# Patient Record
Sex: Female | Born: 1960 | Race: White | Hispanic: No | Smoking: Current every day smoker
Health system: Southern US, Community
[De-identification: ages and names within clinical notes are randomized; demographics above are authoritative.]

## PROBLEM LIST (undated history)

## (undated) DIAGNOSIS — F329 Major depressive disorder, single episode, unspecified: Secondary | ICD-10-CM

## (undated) DIAGNOSIS — G709 Myoneural disorder, unspecified: Secondary | ICD-10-CM

## (undated) DIAGNOSIS — F32A Depression, unspecified: Secondary | ICD-10-CM

## (undated) DIAGNOSIS — M1990 Unspecified osteoarthritis, unspecified site: Secondary | ICD-10-CM

## (undated) DIAGNOSIS — M797 Fibromyalgia: Secondary | ICD-10-CM

## (undated) DIAGNOSIS — R51 Headache: Secondary | ICD-10-CM

## (undated) DIAGNOSIS — F419 Anxiety disorder, unspecified: Secondary | ICD-10-CM

## (undated) DIAGNOSIS — L309 Dermatitis, unspecified: Secondary | ICD-10-CM

## (undated) DIAGNOSIS — R569 Unspecified convulsions: Secondary | ICD-10-CM

## (undated) DIAGNOSIS — K219 Gastro-esophageal reflux disease without esophagitis: Secondary | ICD-10-CM

## (undated) DIAGNOSIS — G8929 Other chronic pain: Secondary | ICD-10-CM

## (undated) DIAGNOSIS — Z8719 Personal history of other diseases of the digestive system: Secondary | ICD-10-CM

## (undated) DIAGNOSIS — F319 Bipolar disorder, unspecified: Secondary | ICD-10-CM

## (undated) DIAGNOSIS — R519 Headache, unspecified: Secondary | ICD-10-CM

## (undated) HISTORY — PX: TUBAL LIGATION: SHX77

## (undated) HISTORY — PX: APPENDECTOMY: SHX54

## (undated) HISTORY — PX: ABDOMINAL HYSTERECTOMY: SHX81

---

## 1983-04-27 HISTORY — PX: BRAIN TUMOR EXCISION: SHX577

## 1997-08-10 ENCOUNTER — Emergency Department (HOSPITAL_COMMUNITY): Admission: EM | Admit: 1997-08-10 | Discharge: 1997-08-10 | Payer: Self-pay | Admitting: Cardiology

## 1997-08-24 ENCOUNTER — Emergency Department (HOSPITAL_COMMUNITY): Admission: EM | Admit: 1997-08-24 | Discharge: 1997-08-24 | Payer: Self-pay | Admitting: Emergency Medicine

## 1997-08-29 ENCOUNTER — Emergency Department (HOSPITAL_COMMUNITY): Admission: EM | Admit: 1997-08-29 | Discharge: 1997-08-29 | Payer: Self-pay | Admitting: Emergency Medicine

## 1997-09-15 ENCOUNTER — Emergency Department (HOSPITAL_COMMUNITY): Admission: EM | Admit: 1997-09-15 | Discharge: 1997-09-15 | Payer: Self-pay | Admitting: Emergency Medicine

## 1997-10-03 ENCOUNTER — Emergency Department (HOSPITAL_COMMUNITY): Admission: EM | Admit: 1997-10-03 | Discharge: 1997-10-03 | Payer: Self-pay | Admitting: Internal Medicine

## 2003-12-02 ENCOUNTER — Other Ambulatory Visit: Payer: Self-pay

## 2004-01-25 ENCOUNTER — Ambulatory Visit: Payer: Self-pay | Admitting: Unknown Physician Specialty

## 2004-02-25 ENCOUNTER — Ambulatory Visit: Payer: Self-pay | Admitting: Unknown Physician Specialty

## 2004-03-02 ENCOUNTER — Emergency Department: Payer: Self-pay | Admitting: Emergency Medicine

## 2004-04-07 ENCOUNTER — Emergency Department: Payer: Self-pay | Admitting: Emergency Medicine

## 2004-05-04 ENCOUNTER — Emergency Department: Payer: Self-pay | Admitting: Emergency Medicine

## 2004-06-11 ENCOUNTER — Emergency Department: Payer: Self-pay | Admitting: Emergency Medicine

## 2004-07-02 ENCOUNTER — Emergency Department: Payer: Self-pay | Admitting: Emergency Medicine

## 2004-08-11 ENCOUNTER — Emergency Department: Payer: Self-pay | Admitting: Emergency Medicine

## 2004-09-22 ENCOUNTER — Emergency Department: Payer: Self-pay | Admitting: Internal Medicine

## 2004-10-06 ENCOUNTER — Emergency Department: Payer: Self-pay | Admitting: Emergency Medicine

## 2004-10-08 ENCOUNTER — Emergency Department: Payer: Self-pay | Admitting: Emergency Medicine

## 2004-10-10 ENCOUNTER — Emergency Department: Payer: Self-pay | Admitting: Emergency Medicine

## 2004-10-24 ENCOUNTER — Emergency Department: Payer: Self-pay | Admitting: Emergency Medicine

## 2004-11-05 ENCOUNTER — Emergency Department: Payer: Self-pay | Admitting: Emergency Medicine

## 2004-12-15 ENCOUNTER — Emergency Department: Payer: Self-pay | Admitting: Emergency Medicine

## 2005-01-21 ENCOUNTER — Emergency Department: Payer: Self-pay | Admitting: Unknown Physician Specialty

## 2005-02-12 ENCOUNTER — Emergency Department: Payer: Self-pay | Admitting: Emergency Medicine

## 2005-03-25 ENCOUNTER — Emergency Department: Payer: Self-pay | Admitting: Emergency Medicine

## 2005-04-19 ENCOUNTER — Emergency Department: Payer: Self-pay | Admitting: Emergency Medicine

## 2005-06-01 ENCOUNTER — Emergency Department: Payer: Self-pay | Admitting: Internal Medicine

## 2005-07-01 ENCOUNTER — Emergency Department: Payer: Self-pay | Admitting: Emergency Medicine

## 2005-09-18 ENCOUNTER — Emergency Department: Payer: Self-pay | Admitting: Emergency Medicine

## 2005-10-11 ENCOUNTER — Emergency Department: Payer: Self-pay | Admitting: Emergency Medicine

## 2005-11-10 ENCOUNTER — Emergency Department: Payer: Self-pay | Admitting: Emergency Medicine

## 2005-11-24 ENCOUNTER — Emergency Department: Payer: Self-pay | Admitting: Emergency Medicine

## 2005-11-27 ENCOUNTER — Emergency Department: Payer: Self-pay | Admitting: Unknown Physician Specialty

## 2005-11-30 ENCOUNTER — Inpatient Hospital Stay: Payer: Self-pay | Admitting: Internal Medicine

## 2005-12-05 ENCOUNTER — Other Ambulatory Visit: Payer: Self-pay

## 2006-02-11 ENCOUNTER — Emergency Department: Payer: Self-pay | Admitting: Emergency Medicine

## 2006-02-21 ENCOUNTER — Emergency Department: Payer: Self-pay | Admitting: Emergency Medicine

## 2006-03-27 ENCOUNTER — Emergency Department: Payer: Self-pay | Admitting: Internal Medicine

## 2006-04-30 ENCOUNTER — Emergency Department: Payer: Self-pay | Admitting: Emergency Medicine

## 2006-06-14 ENCOUNTER — Emergency Department: Payer: Self-pay | Admitting: Emergency Medicine

## 2006-06-22 ENCOUNTER — Emergency Department: Payer: Self-pay | Admitting: Emergency Medicine

## 2006-07-05 ENCOUNTER — Emergency Department: Payer: Self-pay | Admitting: Emergency Medicine

## 2006-07-20 ENCOUNTER — Emergency Department: Payer: Self-pay | Admitting: Emergency Medicine

## 2006-07-27 ENCOUNTER — Emergency Department: Payer: Self-pay

## 2006-08-10 ENCOUNTER — Emergency Department: Payer: Self-pay | Admitting: Emergency Medicine

## 2006-08-26 ENCOUNTER — Emergency Department: Payer: Self-pay | Admitting: Unknown Physician Specialty

## 2006-08-27 ENCOUNTER — Emergency Department: Payer: Self-pay | Admitting: Emergency Medicine

## 2006-09-01 ENCOUNTER — Inpatient Hospital Stay: Payer: Self-pay | Admitting: Internal Medicine

## 2006-09-01 ENCOUNTER — Other Ambulatory Visit: Payer: Self-pay

## 2006-09-18 ENCOUNTER — Emergency Department: Payer: Self-pay | Admitting: General Practice

## 2006-09-20 ENCOUNTER — Emergency Department: Payer: Self-pay | Admitting: Emergency Medicine

## 2006-10-03 ENCOUNTER — Emergency Department: Payer: Self-pay | Admitting: Emergency Medicine

## 2006-10-14 ENCOUNTER — Emergency Department: Payer: Self-pay | Admitting: Emergency Medicine

## 2006-12-02 ENCOUNTER — Other Ambulatory Visit: Payer: Self-pay

## 2006-12-02 ENCOUNTER — Inpatient Hospital Stay: Payer: Self-pay

## 2007-02-04 ENCOUNTER — Emergency Department: Payer: Self-pay | Admitting: Emergency Medicine

## 2007-05-23 ENCOUNTER — Emergency Department: Payer: Self-pay | Admitting: Emergency Medicine

## 2007-05-23 ENCOUNTER — Other Ambulatory Visit: Payer: Self-pay

## 2007-06-16 ENCOUNTER — Inpatient Hospital Stay: Payer: Self-pay | Admitting: Internal Medicine

## 2007-06-16 ENCOUNTER — Other Ambulatory Visit: Payer: Self-pay

## 2007-08-11 ENCOUNTER — Emergency Department: Payer: Self-pay | Admitting: Emergency Medicine

## 2007-12-06 ENCOUNTER — Emergency Department: Payer: Self-pay | Admitting: Emergency Medicine

## 2007-12-28 ENCOUNTER — Emergency Department: Payer: Self-pay | Admitting: Emergency Medicine

## 2008-02-13 ENCOUNTER — Emergency Department: Payer: Self-pay | Admitting: Emergency Medicine

## 2008-05-08 ENCOUNTER — Emergency Department: Payer: Self-pay | Admitting: Emergency Medicine

## 2008-06-19 ENCOUNTER — Emergency Department: Payer: Self-pay | Admitting: Emergency Medicine

## 2008-07-15 ENCOUNTER — Emergency Department: Payer: Self-pay | Admitting: Emergency Medicine

## 2008-07-21 ENCOUNTER — Emergency Department: Payer: Self-pay | Admitting: Emergency Medicine

## 2008-07-23 ENCOUNTER — Inpatient Hospital Stay: Payer: Self-pay | Admitting: Internal Medicine

## 2008-07-25 ENCOUNTER — Inpatient Hospital Stay: Payer: Self-pay | Admitting: Psychiatry

## 2008-10-15 ENCOUNTER — Ambulatory Visit (HOSPITAL_COMMUNITY): Admission: RE | Admit: 2008-10-15 | Discharge: 2008-10-15 | Payer: Self-pay | Admitting: Family Medicine

## 2008-10-31 ENCOUNTER — Ambulatory Visit (HOSPITAL_COMMUNITY): Admission: RE | Admit: 2008-10-31 | Discharge: 2008-10-31 | Payer: Self-pay | Admitting: Family Medicine

## 2008-11-14 ENCOUNTER — Emergency Department (HOSPITAL_COMMUNITY): Admission: EM | Admit: 2008-11-14 | Discharge: 2008-11-14 | Payer: Self-pay | Admitting: Emergency Medicine

## 2008-11-25 ENCOUNTER — Inpatient Hospital Stay: Payer: Self-pay | Admitting: Internal Medicine

## 2008-11-28 ENCOUNTER — Inpatient Hospital Stay: Payer: Self-pay | Admitting: Psychiatry

## 2008-12-08 ENCOUNTER — Emergency Department (HOSPITAL_COMMUNITY): Admission: EM | Admit: 2008-12-08 | Discharge: 2008-12-08 | Payer: Self-pay | Admitting: Emergency Medicine

## 2008-12-23 ENCOUNTER — Emergency Department (HOSPITAL_COMMUNITY): Admission: EM | Admit: 2008-12-23 | Discharge: 2008-12-23 | Payer: Self-pay | Admitting: Emergency Medicine

## 2008-12-31 ENCOUNTER — Emergency Department: Payer: Self-pay | Admitting: Emergency Medicine

## 2009-04-29 ENCOUNTER — Emergency Department: Payer: Self-pay | Admitting: Emergency Medicine

## 2009-05-07 ENCOUNTER — Emergency Department: Payer: Self-pay | Admitting: Emergency Medicine

## 2009-05-09 ENCOUNTER — Emergency Department: Payer: Self-pay | Admitting: Emergency Medicine

## 2009-07-11 ENCOUNTER — Inpatient Hospital Stay: Payer: Self-pay | Admitting: Unknown Physician Specialty

## 2009-10-28 ENCOUNTER — Emergency Department: Payer: Self-pay | Admitting: Emergency Medicine

## 2010-02-01 ENCOUNTER — Emergency Department: Payer: Self-pay | Admitting: Emergency Medicine

## 2010-02-10 ENCOUNTER — Emergency Department: Payer: Self-pay | Admitting: Internal Medicine

## 2010-02-13 ENCOUNTER — Emergency Department: Payer: Self-pay | Admitting: Emergency Medicine

## 2010-02-28 ENCOUNTER — Emergency Department: Payer: Self-pay | Admitting: Emergency Medicine

## 2010-04-02 ENCOUNTER — Observation Stay: Payer: Self-pay | Admitting: Internal Medicine

## 2010-04-16 ENCOUNTER — Emergency Department: Payer: Self-pay | Admitting: Emergency Medicine

## 2010-04-30 ENCOUNTER — Emergency Department: Payer: Self-pay | Admitting: Emergency Medicine

## 2010-05-21 ENCOUNTER — Emergency Department: Payer: Self-pay | Admitting: Emergency Medicine

## 2010-07-19 ENCOUNTER — Emergency Department: Payer: Self-pay | Admitting: Internal Medicine

## 2010-08-09 ENCOUNTER — Emergency Department: Payer: Self-pay | Admitting: Emergency Medicine

## 2010-08-10 ENCOUNTER — Emergency Department: Payer: Self-pay | Admitting: Emergency Medicine

## 2010-08-24 ENCOUNTER — Emergency Department: Payer: Self-pay | Admitting: Internal Medicine

## 2010-10-17 ENCOUNTER — Emergency Department: Payer: Self-pay | Admitting: Emergency Medicine

## 2010-10-20 ENCOUNTER — Emergency Department: Payer: Self-pay | Admitting: Emergency Medicine

## 2010-10-27 ENCOUNTER — Emergency Department: Payer: Self-pay | Admitting: Emergency Medicine

## 2010-12-20 ENCOUNTER — Emergency Department: Payer: Self-pay | Admitting: Emergency Medicine

## 2011-01-03 ENCOUNTER — Emergency Department: Payer: Self-pay | Admitting: Emergency Medicine

## 2011-01-31 ENCOUNTER — Emergency Department: Payer: Self-pay | Admitting: Emergency Medicine

## 2011-02-23 ENCOUNTER — Emergency Department: Payer: Self-pay | Admitting: Emergency Medicine

## 2011-04-18 ENCOUNTER — Emergency Department: Payer: Self-pay | Admitting: Emergency Medicine

## 2011-04-25 ENCOUNTER — Emergency Department: Payer: Self-pay | Admitting: Emergency Medicine

## 2011-09-14 ENCOUNTER — Ambulatory Visit: Payer: Self-pay | Admitting: Internal Medicine

## 2011-11-07 ENCOUNTER — Emergency Department: Payer: Self-pay | Admitting: *Deleted

## 2011-11-28 ENCOUNTER — Emergency Department: Payer: Self-pay | Admitting: Emergency Medicine

## 2012-01-15 ENCOUNTER — Emergency Department: Payer: Self-pay | Admitting: Unknown Physician Specialty

## 2012-01-30 ENCOUNTER — Emergency Department: Payer: Self-pay | Admitting: Unknown Physician Specialty

## 2012-03-25 ENCOUNTER — Emergency Department: Payer: Self-pay | Admitting: Emergency Medicine

## 2012-09-30 ENCOUNTER — Emergency Department: Payer: Self-pay | Admitting: Emergency Medicine

## 2012-10-02 ENCOUNTER — Ambulatory Visit: Payer: Self-pay | Admitting: Internal Medicine

## 2012-11-11 ENCOUNTER — Emergency Department: Payer: Self-pay | Admitting: Emergency Medicine

## 2013-03-12 ENCOUNTER — Emergency Department: Payer: Self-pay | Admitting: Emergency Medicine

## 2013-03-13 LAB — COMPREHENSIVE METABOLIC PANEL
Bilirubin,Total: 0.1 mg/dL — ABNORMAL LOW (ref 0.2–1.0)
Chloride: 115 mmol/L — ABNORMAL HIGH (ref 98–107)
Creatinine: 0.91 mg/dL (ref 0.60–1.30)
EGFR (Non-African Amer.): >60
Glucose: 90 mg/dL (ref 65–99)
SGOT(AST): 27 U/L (ref 15–37)
Sodium: 148 mmol/L — ABNORMAL HIGH (ref 136–145)

## 2013-03-13 LAB — DIFFERENTIAL
Basophil %: 1.1 %
Eosinophil #: 0.4 10*3/uL (ref 0.0–0.7)
Eosinophil %: 5.3 %
Lymphocyte #: 3.5 10*3/uL (ref 1.0–3.6)
Lymphocyte %: 48.8 %
Neutrophil #: 2.7 10*3/uL (ref 1.4–6.5)
Neutrophil %: 37.8 %

## 2013-03-13 LAB — URINALYSIS, COMPLETE
Bilirubin,UR: NEGATIVE
Ketone: NEGATIVE
RBC,UR: 1 /HPF (ref 0–5)
Squamous Epithelial: NONE SEEN
WBC UR: 1 /HPF (ref 0–5)

## 2013-03-13 LAB — DRUG SCREEN, URINE
Amphetamines, Ur Screen: NEGATIVE (ref ?–1000)
Benzodiazepine, Ur Scrn: NEGATIVE (ref ?–200)
Opiate, Ur Screen: NEGATIVE (ref ?–300)
Phencyclidine (PCP) Ur S: NEGATIVE (ref ?–25)
Tricyclic, Ur Screen: NEGATIVE (ref ?–1000)

## 2013-03-13 LAB — CBC
HCT: 44.7 % (ref 35.0–47.0)
HGB: 15.1 g/dL (ref 12.0–16.0)
MCH: 35.1 pg — ABNORMAL HIGH (ref 26.0–34.0)
MCV: 104 fL — ABNORMAL HIGH (ref 80–100)
Platelet: 233 10*3/uL (ref 150–440)

## 2013-03-13 LAB — TSH: Thyroid Stimulating Horm: 2.62 u[IU]/mL

## 2013-03-13 LAB — ETHANOL
Ethanol %: 0.182 % — ABNORMAL HIGH (ref 0.000–0.080)
Ethanol: 182 mg/dL

## 2013-03-24 ENCOUNTER — Emergency Department: Payer: Self-pay | Admitting: Internal Medicine

## 2013-03-24 LAB — CBC
HCT: 37.6 % (ref 35.0–47.0)
HGB: 12.7 g/dL (ref 12.0–16.0)
MCHC: 33.7 g/dL (ref 32.0–36.0)
MCV: 105 fL — ABNORMAL HIGH (ref 80–100)
Platelet: 201 10*3/uL (ref 150–440)
WBC: 5.5 10*3/uL (ref 3.6–11.0)

## 2013-03-25 LAB — COMPREHENSIVE METABOLIC PANEL
BUN: 11 mg/dL (ref 7–18)
Calcium, Total: 8.2 mg/dL — ABNORMAL LOW (ref 8.5–10.1)
Chloride: 113 mmol/L — ABNORMAL HIGH (ref 98–107)
Creatinine: 0.88 mg/dL (ref 0.60–1.30)
EGFR (African American): >60
EGFR (Non-African Amer.): >60
Osmolality: 288 (ref 275–301)
Potassium: 2.9 mmol/L — ABNORMAL LOW (ref 3.5–5.1)
SGPT (ALT): 10 U/L — ABNORMAL LOW (ref 12–78)

## 2013-03-25 LAB — TROPONIN I: Troponin-I: <0.02 ng/mL

## 2013-03-25 LAB — DRUG SCREEN, URINE
Amphetamines, Ur Screen: NEGATIVE (ref ?–1000)
Benzodiazepine, Ur Scrn: POSITIVE (ref ?–200)
Cannabinoid 50 Ng, Ur ~~LOC~~: NEGATIVE (ref ?–50)
MDMA (Ecstasy)Ur Screen: NEGATIVE (ref ?–500)
Methadone, Ur Screen: NEGATIVE (ref ?–300)
Opiate, Ur Screen: NEGATIVE (ref ?–300)

## 2013-03-25 LAB — URINALYSIS, COMPLETE
Bacteria: NONE SEEN
Bilirubin,UR: NEGATIVE
Blood: NEGATIVE
Ph: 6 (ref 4.5–8.0)
Protein: NEGATIVE
WBC UR: NONE SEEN /HPF (ref 0–5)

## 2013-03-25 LAB — SALICYLATE LEVEL: Salicylates, Serum: 13.5 mg/dL — ABNORMAL HIGH

## 2013-03-25 LAB — ACETAMINOPHEN LEVEL: Acetaminophen: <2 ug/mL

## 2013-03-25 LAB — ETHANOL: Ethanol: 137 mg/dL

## 2013-04-30 ENCOUNTER — Inpatient Hospital Stay: Payer: Self-pay | Admitting: Internal Medicine

## 2013-04-30 LAB — COMPREHENSIVE METABOLIC PANEL
ANION GAP: 2 — AB (ref 7–16)
Albumin: 3.4 g/dL (ref 3.4–5.0)
Alkaline Phosphatase: 70 U/L
BUN: 17 mg/dL (ref 7–18)
Bilirubin,Total: 0.3 mg/dL (ref 0.2–1.0)
CALCIUM: 8.1 mg/dL — AB (ref 8.5–10.1)
CHLORIDE: 118 mmol/L — AB (ref 98–107)
Co2: 24 mmol/L (ref 21–32)
Creatinine: 1.42 mg/dL — ABNORMAL HIGH (ref 0.60–1.30)
GFR CALC AF AMER: 49 — AB
GFR CALC NON AF AMER: 43 — AB
GLUCOSE: 83 mg/dL (ref 65–99)
Osmolality: 288 (ref 275–301)
POTASSIUM: 3.9 mmol/L (ref 3.5–5.1)
SGOT(AST): 27 U/L (ref 15–37)
SGPT (ALT): 13 U/L (ref 12–78)
Sodium: 144 mmol/L (ref 136–145)
TOTAL PROTEIN: 6.8 g/dL (ref 6.4–8.2)

## 2013-04-30 LAB — DRUG SCREEN, URINE
Amphetamines, Ur Screen: NEGATIVE (ref ?–1000)
Barbiturates, Ur Screen: NEGATIVE (ref ?–200)
Benzodiazepine, Ur Scrn: POSITIVE (ref ?–200)
Cannabinoid 50 Ng, Ur ~~LOC~~: NEGATIVE (ref ?–50)
Cocaine Metabolite,Ur ~~LOC~~: POSITIVE (ref ?–300)
MDMA (ECSTASY) UR SCREEN: POSITIVE (ref ?–500)
Methadone, Ur Screen: NEGATIVE (ref ?–300)
OPIATE, UR SCREEN: NEGATIVE (ref ?–300)
Phencyclidine (PCP) Ur S: NEGATIVE (ref ?–25)
TRICYCLIC, UR SCREEN: NEGATIVE (ref ?–1000)

## 2013-04-30 LAB — ETHANOL

## 2013-04-30 LAB — URINALYSIS, COMPLETE
BILIRUBIN, UR: NEGATIVE
Blood: NEGATIVE
Glucose,UR: NEGATIVE mg/dL (ref 0–75)
KETONE: NEGATIVE
LEUKOCYTE ESTERASE: NEGATIVE
NITRITE: NEGATIVE
PH: 5 (ref 4.5–8.0)
Protein: NEGATIVE
Specific Gravity: 1.03 (ref 1.003–1.030)
WBC UR: 1 /HPF (ref 0–5)

## 2013-04-30 LAB — TSH: Thyroid Stimulating Horm: 0.99 u[IU]/mL

## 2013-04-30 LAB — CBC
HCT: 38.7 % (ref 35.0–47.0)
HGB: 13.1 g/dL (ref 12.0–16.0)
MCH: 35.3 pg — ABNORMAL HIGH (ref 26.0–34.0)
MCHC: 33.9 g/dL (ref 32.0–36.0)
MCV: 104 fL — AB (ref 80–100)
Platelet: 197 10*3/uL (ref 150–440)
RBC: 3.72 10*6/uL — AB (ref 3.80–5.20)
RDW: 12.9 % (ref 11.5–14.5)
WBC: 6.7 10*3/uL (ref 3.6–11.0)

## 2013-04-30 LAB — SALICYLATE LEVEL: Salicylates, Serum: 47.7 mg/dL

## 2013-04-30 LAB — VALPROIC ACID LEVEL: Valproic Acid: <3 ug/mL — ABNORMAL LOW

## 2013-04-30 LAB — ACETAMINOPHEN LEVEL: Acetaminophen: <2 ug/mL

## 2013-05-01 ENCOUNTER — Inpatient Hospital Stay: Payer: Self-pay | Admitting: Psychiatry

## 2013-05-01 LAB — CBC WITH DIFFERENTIAL/PLATELET
Basophil #: 0.1 10*3/uL (ref 0.0–0.1)
Basophil %: 1.1 %
EOS ABS: 0.4 10*3/uL (ref 0.0–0.7)
Eosinophil %: 7.9 %
HCT: 33.6 % — ABNORMAL LOW (ref 35.0–47.0)
HGB: 11.5 g/dL — AB (ref 12.0–16.0)
LYMPHS ABS: 1.9 10*3/uL (ref 1.0–3.6)
Lymphocyte %: 40.7 %
MCH: 35.3 pg — AB (ref 26.0–34.0)
MCHC: 34.1 g/dL (ref 32.0–36.0)
MCV: 104 fL — ABNORMAL HIGH (ref 80–100)
MONOS PCT: 5.9 %
Monocyte #: 0.3 x10 3/mm (ref 0.2–0.9)
NEUTROS PCT: 44.4 %
Neutrophil #: 2 10*3/uL (ref 1.4–6.5)
Platelet: 181 10*3/uL (ref 150–440)
RBC: 3.25 10*6/uL — AB (ref 3.80–5.20)
RDW: 13.2 % (ref 11.5–14.5)
WBC: 4.6 10*3/uL (ref 3.6–11.0)

## 2013-05-01 LAB — COMPREHENSIVE METABOLIC PANEL
ALK PHOS: 58 U/L
Albumin: 2.5 g/dL — ABNORMAL LOW (ref 3.4–5.0)
Anion Gap: 6 — ABNORMAL LOW (ref 7–16)
BILIRUBIN TOTAL: 0.4 mg/dL (ref 0.2–1.0)
BUN: 13 mg/dL (ref 7–18)
CALCIUM: 7.9 mg/dL — AB (ref 8.5–10.1)
CO2: 25 mmol/L (ref 21–32)
CREATININE: 0.97 mg/dL (ref 0.60–1.30)
Chloride: 114 mmol/L — ABNORMAL HIGH (ref 98–107)
EGFR (African American): >60
GLUCOSE: 109 mg/dL — AB (ref 65–99)
Osmolality: 289 (ref 275–301)
POTASSIUM: 3.9 mmol/L (ref 3.5–5.1)
SGOT(AST): 39 U/L — ABNORMAL HIGH (ref 15–37)
SGPT (ALT): 13 U/L (ref 12–78)
SODIUM: 145 mmol/L (ref 136–145)
TOTAL PROTEIN: 6.3 g/dL — AB (ref 6.4–8.2)

## 2013-05-01 LAB — BASIC METABOLIC PANEL
ANION GAP: 5 — AB (ref 7–16)
BUN: 17 mg/dL (ref 7–18)
CALCIUM: 7.6 mg/dL — AB (ref 8.5–10.1)
CO2: 23 mmol/L (ref 21–32)
CREATININE: 1.24 mg/dL (ref 0.60–1.30)
Chloride: 117 mmol/L — ABNORMAL HIGH (ref 98–107)
EGFR (Non-African Amer.): 50 — ABNORMAL LOW
GFR CALC AF AMER: 58 — AB
Glucose: 109 mg/dL — ABNORMAL HIGH (ref 65–99)
OSMOLALITY: 291 (ref 275–301)
Potassium: 3.5 mmol/L (ref 3.5–5.1)
Sodium: 145 mmol/L (ref 136–145)

## 2013-05-01 LAB — SALICYLATE LEVEL
SALICYLATES, SERUM: 33.3 mg/dL — AB
SALICYLATES, SERUM: 30.9 mg/dL — AB
Salicylates, Serum: 38.6 mg/dL

## 2013-05-20 ENCOUNTER — Emergency Department: Payer: Self-pay | Admitting: Emergency Medicine

## 2013-06-27 ENCOUNTER — Inpatient Hospital Stay: Payer: Self-pay | Admitting: Surgery

## 2013-06-27 LAB — BASIC METABOLIC PANEL
Anion Gap: 3 — ABNORMAL LOW (ref 7–16)
BUN: 10 mg/dL (ref 7–18)
CALCIUM: 8.8 mg/dL (ref 8.5–10.1)
CHLORIDE: 111 mmol/L — AB (ref 98–107)
CREATININE: 0.86 mg/dL (ref 0.60–1.30)
Co2: 29 mmol/L (ref 21–32)
EGFR (African American): >60
EGFR (Non-African Amer.): >60
Glucose: 86 mg/dL (ref 65–99)
OSMOLALITY: 283 (ref 275–301)
Potassium: 3.6 mmol/L (ref 3.5–5.1)
Sodium: 143 mmol/L (ref 136–145)

## 2013-06-27 LAB — ETHANOL
ETHANOL %: 0.21 % — AB (ref 0.000–0.080)
ETHANOL LVL: 210 mg/dL

## 2013-06-27 LAB — CBC
HCT: 40.2 % (ref 35.0–47.0)
HGB: 13.9 g/dL (ref 12.0–16.0)
MCH: 36.5 pg — ABNORMAL HIGH (ref 26.0–34.0)
MCHC: 34.5 g/dL (ref 32.0–36.0)
MCV: 106 fL — ABNORMAL HIGH (ref 80–100)
Platelet: 362 10*3/uL (ref 150–440)
RBC: 3.8 10*6/uL (ref 3.80–5.20)
RDW: 13.5 % (ref 11.5–14.5)
WBC: 8.6 10*3/uL (ref 3.6–11.0)

## 2013-07-11 ENCOUNTER — Ambulatory Visit: Payer: Self-pay | Admitting: Surgery

## 2013-07-16 ENCOUNTER — Emergency Department: Payer: Self-pay | Admitting: Emergency Medicine

## 2013-10-19 ENCOUNTER — Emergency Department: Payer: Self-pay | Admitting: Emergency Medicine

## 2013-10-19 LAB — CBC
HCT: 36.6 % (ref 35.0–47.0)
HGB: 11.8 g/dL — ABNORMAL LOW (ref 12.0–16.0)
MCH: 34.1 pg — AB (ref 26.0–34.0)
MCHC: 32.3 g/dL (ref 32.0–36.0)
MCV: 106 fL — AB (ref 80–100)
PLATELETS: 171 10*3/uL (ref 150–440)
RBC: 3.46 10*6/uL — ABNORMAL LOW (ref 3.80–5.20)
RDW: 13.2 % (ref 11.5–14.5)
WBC: 5.6 10*3/uL (ref 3.6–11.0)

## 2013-10-19 LAB — COMPREHENSIVE METABOLIC PANEL
ALBUMIN: 2.9 g/dL — AB (ref 3.4–5.0)
ALT: 9 U/L — AB (ref 12–78)
AST: 18 U/L (ref 15–37)
Alkaline Phosphatase: 72 U/L
Anion Gap: 3 — ABNORMAL LOW (ref 7–16)
BUN: 13 mg/dL (ref 7–18)
Bilirubin,Total: 0.1 mg/dL — ABNORMAL LOW (ref 0.2–1.0)
CALCIUM: 8.1 mg/dL — AB (ref 8.5–10.1)
CREATININE: 1.02 mg/dL (ref 0.60–1.30)
Chloride: 111 mmol/L — ABNORMAL HIGH (ref 98–107)
Co2: 28 mmol/L (ref 21–32)
EGFR (African American): >60
EGFR (Non-African Amer.): >60
Glucose: 86 mg/dL (ref 65–99)
Osmolality: 283 (ref 275–301)
Potassium: 3.7 mmol/L (ref 3.5–5.1)
Sodium: 142 mmol/L (ref 136–145)
Total Protein: 6.1 g/dL — ABNORMAL LOW (ref 6.4–8.2)

## 2013-10-19 LAB — ACETAMINOPHEN LEVEL: Acetaminophen: <2 ug/mL

## 2013-10-19 LAB — SALICYLATE LEVEL: SALICYLATES, SERUM: 15.7 mg/dL — AB

## 2013-10-19 LAB — CK TOTAL AND CKMB (NOT AT ARMC)
CK, Total: 85 U/L
CK-MB: 1.3 ng/mL (ref 0.5–3.6)

## 2013-10-19 LAB — TROPONIN I: Troponin-I: <0.02 ng/mL

## 2013-10-19 LAB — ETHANOL: Ethanol %: <0.003 % (ref 0.000–0.080)

## 2013-10-19 LAB — VALPROIC ACID LEVEL: VALPROIC ACID: 52 ug/mL

## 2013-10-19 LAB — TSH: Thyroid Stimulating Horm: 1.92 u[IU]/mL

## 2013-10-20 LAB — DRUG SCREEN, URINE
AMPHETAMINES, UR SCREEN: NEGATIVE (ref ?–1000)
Barbiturates, Ur Screen: NEGATIVE (ref ?–200)
Benzodiazepine, Ur Scrn: POSITIVE (ref ?–200)
Cannabinoid 50 Ng, Ur ~~LOC~~: NEGATIVE (ref ?–50)
Cocaine Metabolite,Ur ~~LOC~~: POSITIVE (ref ?–300)
MDMA (Ecstasy)Ur Screen: NEGATIVE (ref ?–500)
Methadone, Ur Screen: NEGATIVE (ref ?–300)
Opiate, Ur Screen: NEGATIVE (ref ?–300)
Phencyclidine (PCP) Ur S: NEGATIVE (ref ?–25)
Tricyclic, Ur Screen: NEGATIVE (ref ?–1000)

## 2013-10-20 LAB — URINALYSIS, COMPLETE
BACTERIA: NONE SEEN
BILIRUBIN, UR: NEGATIVE
Blood: NEGATIVE
GLUCOSE, UR: NEGATIVE mg/dL (ref 0–75)
Ketone: NEGATIVE
Nitrite: NEGATIVE
PH: 5 (ref 4.5–8.0)
PROTEIN: NEGATIVE
SPECIFIC GRAVITY: 1.017 (ref 1.003–1.030)
Transitional Epi: 1
WBC UR: 6 /HPF (ref 0–5)

## 2013-10-22 LAB — SALICYLATE LEVEL: Salicylates, Serum: 2.2 mg/dL

## 2013-11-26 ENCOUNTER — Emergency Department: Payer: Self-pay | Admitting: Emergency Medicine

## 2014-01-01 ENCOUNTER — Emergency Department: Payer: Self-pay | Admitting: Student

## 2014-01-01 LAB — CBC WITH DIFFERENTIAL/PLATELET
BASOS PCT: 0.7 %
Basophil #: 0.1 10*3/uL (ref 0.0–0.1)
EOS ABS: 0.1 10*3/uL (ref 0.0–0.7)
Eosinophil %: 1.1 %
HCT: 37.6 % (ref 35.0–47.0)
HGB: 11.7 g/dL — ABNORMAL LOW (ref 12.0–16.0)
LYMPHS ABS: 2.1 10*3/uL (ref 1.0–3.6)
LYMPHS PCT: 18.4 %
MCH: 32.3 pg (ref 26.0–34.0)
MCHC: 31.1 g/dL — AB (ref 32.0–36.0)
MCV: 104 fL — AB (ref 80–100)
MONO ABS: 0.5 x10 3/mm (ref 0.2–0.9)
MONOS PCT: 4.7 %
NEUTROS PCT: 75.1 %
Neutrophil #: 8.3 10*3/uL — ABNORMAL HIGH (ref 1.4–6.5)
Platelet: 257 10*3/uL (ref 150–440)
RBC: 3.62 10*6/uL — ABNORMAL LOW (ref 3.80–5.20)
RDW: 14.1 % (ref 11.5–14.5)
WBC: 11.1 10*3/uL — AB (ref 3.6–11.0)

## 2014-01-01 LAB — COMPREHENSIVE METABOLIC PANEL
ALK PHOS: 100 U/L
ALT: 8 U/L — AB
ANION GAP: 3 — AB (ref 7–16)
Albumin: 3.4 g/dL (ref 3.4–5.0)
BUN: 13 mg/dL (ref 7–18)
Bilirubin,Total: 0.3 mg/dL (ref 0.2–1.0)
Calcium, Total: 8.3 mg/dL — ABNORMAL LOW (ref 8.5–10.1)
Chloride: 110 mmol/L — ABNORMAL HIGH (ref 98–107)
Co2: 25 mmol/L (ref 21–32)
Creatinine: 1.13 mg/dL (ref 0.60–1.30)
EGFR (African American): >60
GFR CALC NON AF AMER: 56 — AB
Glucose: 102 mg/dL — ABNORMAL HIGH (ref 65–99)
Osmolality: 276 (ref 275–301)
POTASSIUM: 3.6 mmol/L (ref 3.5–5.1)
SGOT(AST): 19 U/L (ref 15–37)
SODIUM: 138 mmol/L (ref 136–145)
TOTAL PROTEIN: 7.2 g/dL (ref 6.4–8.2)

## 2014-01-01 LAB — APTT: ACTIVATED PTT: 32.9 s (ref 23.6–35.9)

## 2014-01-01 LAB — PROTIME-INR
INR: 1.1
PROTHROMBIN TIME: 13.9 s (ref 11.5–14.7)

## 2014-03-17 ENCOUNTER — Emergency Department: Payer: Self-pay | Admitting: Internal Medicine

## 2014-08-17 NOTE — H&P (Signed)
PATIENT NAME:  Ariel White, Ariel White MR#:  [id number] DATE OF BIRTH:  06-Apr-1961  DATE OF ADMISSION:  04/30/2013  REFERRING PHYSICIAN: Dr. Margarita Grizzle.   PRIMARY CARE PHYSICIAN: Dr. Arlana Pouch.   CHIEF COMPLAINT: Suicide attempt.   HISTORY OF PRESENT ILLNESS: A 54 year old Caucasian female with past medical history of depression, presenting after an intentional overdose. States that she was fighting with her family; however, her father states she took Xanax 1 mg 8 tablets as well as trazodone 100 mg 2 tablets at approximately 5:30 p.m. EMS was called by the family with concern. In the Emergency Department, she was found to be somnolent, though easily arousable. On basic workup, she was found to have an elevated salicylate level. Case discussed with Poison Control. Recommended starting her on bicarb drip and serial monitoring.   REVIEW OF SYSTEMS:  CONSTITUTIONAL: Denies fever, fatigue, weakness, pain.  EYES: Denies blurred vision, double vision, eye pain.  EARS, NOSE, THROAT: Denies tinnitus, ear pain, hearing loss.  RESPIRATORY: Denies cough, wheeze, shortness of breath.  CARDIOVASCULAR: Denies chest pain, palpitations, edema.  GASTROINTESTINAL: Denies nausea, vomiting, diarrhea, abdominal pain.  GENITOURINARY: Denies dysuria or hematuria.  ENDOCRINE: Denies nocturia or thyroid problems.  HEMATOLOGIC AND LYMPHATIC: Denies easy bruising or bleeding.  SKIN: Denies rash or lesions.  MUSCULOSKELETAL: Denies pain in neck, back, shoulder, knees, hips or arthritic symptoms.  NEUROLOGIC: Denies paralysis, paresthesias.  PSYCHIATRIC: Positive for depression as described above as well as suicide attempt.   Otherwise. review of systems performed by me is negative.   PAST MEDICAL HISTORY: Depression, seizure disorder, history of substance abuse including cocaine and alcohol.   SOCIAL HISTORY: Denies any alcohol usage, though admits to tobacco use. Denies any IV drug usage.   FAMILY HISTORY: Denies any known  cardiovascular or seizure disorders.   ALLERGIES: COMPAZINE, CONTRAST DYE, IMITREX, AS WELL AS ZOFRAN.   HOME MEDICATIONS: Depakote extended release 500 mg daily, multivitamin 1 tab daily, Pristiq 100 mg p.o. daily, tramadol 50 mg p.o. every 4 hours as needed for pain, trazodone 100 mg 2 tablets at bedtime, Xanax 0.5 mg p.o. 3 times daily as needed for anxiety.   PHYSICAL EXAMINATION:  VITAL SIGNS: Temperature 98.2, heart rate 87, respirations 18, blood pressure 96/50, saturating 98% on room air. Weight 54.4 kg, BMI 21.3.  GENERAL: Somewhat disheveled-appearing Caucasian female who is currently in minimal distress secondary to mental status.  HEAD: Normocephalic, atraumatic.  EYES: Pupils equal, round and reactive to light. Extraocular muscles intact. No scleral icterus.  MOUTH: Moist mucosal membranes. Dentition intact. No abscess noted.  EARS, NOSE, THROAT: Throat clear without exudates. No external lesions.  NECK: Supple. No thyromegaly. No nodules. No JVD.  PULMONARY: Clear to auscultation bilaterally. No wheezes, rales, rhonchi. No use of accessory muscles. Good respiratory effort.  CHEST: Nontender to palpation.  CARDIOVASCULAR: S1, S2, regular rate and rhythm. No murmurs, rubs or gallops. No edema. Pedal pulses 2+ bilaterally.  GASTROINTESTINAL: Soft, nontender, nondistended. No masses. Positive bowel sounds. No hepatosplenomegaly.  MUSCULOSKELETAL: No swelling, clubbing or edema. Range of motion full in all extremities.  NEUROLOGIC: Cranial nerves II through XII intact. No gross focal neurological deficits. Sensation intact. Reflexes intact.  SKIN: No ulcerations, lesions, rash or cyanosis. Skin warm, dry. Turgor is intact.  PSYCHIATRIC: The patient is somnolent, though easily arousable to verbal stimuli, conversing but then falling back asleep. Mood and affect blunted. She is oriented x 3. Insight and judgment are poor.   LABORATORY DATA: Sodium 144, potassium 3.9, chloride 118,  bicarb 24, BUN 17, creatinine 1.42, glucose 83. LFTs within normal limits. TSH 0.99. Urine drug screen positive for benzodiazepines, cocaine, as well as MDMA. WBC 6.7, hemoglobin 13.1, platelets 197. Urinalysis negative for evidence of infection. Acetaminophen level less than 2. Salicylate level of 47.7. EKG performed revealing normal sinus rhythm, heart rate 79. No ST or T wave abnormalities.   ASSESSMENT AND PLAN: A 54 year old Caucasian female with history of depression and seizures, presenting after intentional overdose after taking Xanax 8 mg and trazodone 200 mg at 5:30. Also found to be urine drug screen positive for cocaine, Ecstasy and benzodiazepines.  1. Salicylate toxicity: Case discussed with [facility]. She has been started on a bicarb drip. Will follow salicylate levels q.3 hours to follow trend, they recommend observztion until 2 decending levels less than 35 2. Suicide attempt: Place on suicide precautions. Consult psychiatry.  3. Seizure disorder: Continue Depakote.  4. Depression, not otherwise specified: Continue home dose of Pristiq.  6. Venous thromboembolism prophylaxis with heparin subcutaneous.   The patient is FULL CODE.   TIME SPENT: 45 minutes.    ____________________________ Cletis Athensavid K. Hower, MD dkh:gb D: 04/30/2013 23:46:24 ET T: 05/01/2013 00:10:05 ET JOB#: [id number]  cc: Cletis Athensavid K. Hower, MD, <Dictator> DAVID Synetta ShadowK HOWER MD ELECTRONICALLY SIGNED 05/01/2013 0:38

## 2014-08-17 NOTE — Consult Note (Signed)
PATIENT NAME:  Ariel White, [name] MR#:  [id number] DATE OF BIRTH:  1960/05/18  DATE OF CONSULTATION:  10/20/2013  CONSULTING PHYSICIAN:  Surya K. Challa, MD  SEX: Female.  SUBJECTIVE: The patient was seen in consultation in [facility]. 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 The patient is divorced for many years and lives by herself. The patient was brought to the Emergency Room at [facility] with a chief complaint "depressed and drug overdose."  HISTORY OF PRESENT ILLNESS: According to information obtained from the chart, the patient was found face down in her yard in the driveway and was brought here. The patient reports that she took her trazodone and alprazolam and Depakote, which was prescribed to her by her psychiatrist and she took it at bedtime and she went to walk towards the mailbox and she did not know what happened and she came here.  PAST PSYCHIATRIC HISTORY: History of being followed by psychiatrist, Dr. Pollyann GlenHenry Miller and last appointment was a few days ago. Next appointment is 3 months. She gets to be seen by him every 3 months and she takes medications as prescribed.  ALCOHOL AND DRUGS: Denies drinking alcohol, denies street or prescription abuse. Denies smoking nicotine cigarettes.  PAST PSYCHIATRIC HISTORY: Was inpatient hold on psychiatry on 2 occasions, once in [place] and  once at [facility] for [facility]. She tried to overdose her on prescription medications many years ago.  MENTAL STATUS EXAMINATION: The patient is alert and oriented to place and time. Affect is neutral. Mood is stable, denies feeling depressed. Denies feeling hopeless, helpless, denies feeling worthless or useless. Cognition is below average because of status post surgery for brain tumor. However, she knew the date with a little prompting and help. She knew [place] and  capital of [place], name of the current president. Denies feeling depressed. Denies feeling hopeless or helpless. Denies suicidal or homicidal idea or plans. No psychosis, denies auditory or visual, denies hearing voices. . Contracts for safety. Insight and judgment fair. Impulse control is fair. Eager to go home and take care of herself and realizes that she should not stay awake after she has taken her sleep medications that are prescribed by her physician.  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 Currently stable on medications.   RECOMMENDATION: Recommend discharge IVC and discharge patient back home and she will keep up a followup appointment and realizes that she should stay home and go to bed after she takes her sleep medications.    ____________________________ Jannet MantisSurya K. Guss Bundehalla, MD skc:lt D: 10/20/2013 19:11:33 ET T: 10/21/2013 02:09:06 ET JOB#: [id number]  cc: Monika SalkSurya K. Guss Bundehalla, MD, <Dictator> Beau FannySURYA K CHALLA MD ELECTRONICALLY SIGNED 10/21/2013 17:41

## 2014-08-17 NOTE — Discharge Summary (Signed)
PATIENT NAME:  Ariel White, Ariel White MR#:  [id number] DATE OF BIRTH:  04/13/1961  DATE OF ADMISSION:  05/01/2013 DATE OF DISCHARGE:  05/03/2013  HOSPITAL COURSE: See dictated history and physical for details of admission. This [age] year old woman with a history of depression and opiate abuse in the past, came into the hospital having taken an excessive amount of her medication. She required treatment on the medical service briefly to stabilize. The patient is ambivalent about what her intention was with the overdose. She now is tending to say that she does not think she was actually intending to hurt or kill herself with it. She was cooperative with treatment in the hospital. She has talked about having a lot of stress in her life financially in her relationship with her family. She also has chronic headaches, which are an ongoing stress for her. Here in the hospital, she has denied suicidal ideation. Her mood is improved and feels close to baseline. The patient feel like she has better ways to deal with stress at home and has positive thoughts about her life in the future. She has outpatient treatment already set up with [facility] [place]. She has been counseled about the importance of not overdosing on her medication and trying to minimize her use of sedating medication generally and understands this. Here on the psychiatry ward, I did give her a single dose of narcotic pain medicine one time for her severe headaches because of her history that tends to rule out many other treatments, but she understands and has worked it out with her primary care doctor to try and minimize that.   MENTAL STATUS EXAM AT DISCHARGE: Casually dressed, still not very well-groomed woman, looks her stated age or older, cooperative with the interview. Eye contact adequate. Psychomotor activity still a bit sluggish. Speech understandable, normal tone. Affect slightly constricted, not tearful. Does not feel like she is  feeling depressed or sad currently. Thoughts are lucid without obvious loosening of associations or delusions. Denies hallucinations. Denies any suicidal or homicidal ideation. Judgment and insight adequate. Intelligence average. Alert and oriented. The patient is able to express positive thoughts about the future and articulate a positive plan for dealing with stress.   DISCHARGE MEDICATIONS: Depakote 500 mg at night, trazodone 100 mg 2 tablets at night, Pristiq 100 mg once a day, Xanax 1 tablet 3 times a day for anxiety. I have given her only a 10 day supply of this medicine.   LABORATORY RESULTS: Chemistry panel showed most recently still slightly elevated chloride at 114. Glucose 109, calcium low at 7.9, AST slightly elevated at 39; that was a couple of days ago. Her salicylate level had been tending down and it was last checked on the 6th at which time it was down to 33, still slightly into the toxic range. She has not received any more salicylates since then. The patient has been counseled about the potential dangers of salicylate use and strongly encouraged to avoid the use of aspirin, especially in higher than strictly recommended doses.   DISPOSITION: Discharge home. Follow up with Dr. Percell BeltMillet at [facility].   DIAGNOSIS, PRINCIPAL AND PRIMARY:  AXIS I: Depression, not otherwise specified.  SECONDARY DIAGNOSES:  AXIS I: Opiate dependence, in partial remission.  AXIS II: Histrionic and borderline features.  AXIS III: Chronic headaches, migraine type by history.  AXIS IV: Moderate to severe from isolation.  AXIS V: Functioning at time of discharge 55. ____________________________ Audery AmelJohn T. Vayla Wilhelmi, MD jtc:aw D:  05/03/2013 11:06:21 ET T: 05/03/2013 11:22:21 ET JOB#: [id number]  cc: Audery Amel, MD, <Dictator> Audery Amel MD ELECTRONICALLY SIGNED 05/03/2013 23:00

## 2014-08-17 NOTE — Consult Note (Signed)
Brief Consult Note: Diagnosis: Mood disorder NOS, Cocaine abuse.   Patient was seen by consultant.   Consult note dictated.   Recommend further assessment or treatment.   Orders entered.   Discussed with Attending MD.   Comments: [name] has a h/o mood instability and substance abuse. She is stable on medications prescribed by Dr. Percell White, her primary psychiatrist. She was brought here after found in her yard with presumed overdose. The patient adamantly denies SI/HI.  PLAN: 1. She no longer meets criteria for IVC. I will terminate proceedings. Please discharge as appropriate.   2. She is to continue all medications w/o changes. No Rx necessary.  3. She will see Dr. Percell White on 7/21 at 3:00.  Electronic Signatures: Ariel White, Ariel White (MD)  (Signed 29-Jun-15 12:59)  Authored: Brief Consult Note   Last Updated: 29-Jun-15 12:59 by Ariel White, Ariel White (MD)
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
## 2014-08-17 NOTE — Discharge Summary (Signed)
PATIENT NAME:  Ariel White, Ariel White MR#:  [id number] DATE OF BIRTH:  08/05/1960  DATE OF ADMISSION:  04/30/2013 DATE OF DISCHARGE:  05/01/2013   DISPOSITION: Transferred to Behavioral Medicine.   ADMITTING DIAGNOSIS: Suicidal attempt with overdose on Xanax and trazodone.   DISCHARGE DIAGNOSES: 1.  Overdose on Xanax, trazodone, also noticed to have elevated salicylate level, patient treated with bicarbonate. Salicylate level is now normal.  2.  Depression. 3.  Seizure disorder.  4.  History of substance abuse.   PERTINENT LABORATORIES AND EVALUATIONS: Admitting glucose 83, BUN 17, creatinine 1.42, sodium 144, potassium 3.9, chloride 118, CO2 of 24; calcium was 8.1. Alcohol level was less than 3. LFTs were normal. TSH 0.99. Valproic acid less than 3. TUDS were positive for benzo, cocaine, MDMA. WBC 6.7, hemoglobin 13.1; platelet count was 197. Urinalysis was negative. Her salicylate level was 47, then 38.6, and then 30.9.   EKG: Normal sinus rhythm without any ST-T wave changes.   CONSULTANTS: Psychiatry, Dr. Brandy HaleUzma Faheem.   HOSPITAL COURSE: Please refer to H and P done by the admitting physician. The patient is a 54 year old white female who had an altercation with her family members and took overdose on Xanax and trazodone. She came to the ED and was noted to have elevated salicylate levels. Therefore, we were asked to admit the patient. Poison Control was contacted, and they recommended a bicarbonate drip. The patient was continued on bicarbonate drip until Poison Control recommended to stop the bicarbonate levels after salicylate levels normalized. The patient continued to be depressed. She was seen by psychiatry, and they recommended she be admitted to Behavioral Medicine. She is currently stable for Behavioral Medicine admission.  DISCHARGE MEDICATIONS: At the time of Behavioral Medicine transfer: Tylenol 650 q.4 p.r.n. for pain, acetaminophen/oxycodone 325/5 one tab p.o. q.6 p.r.n., magnesium  hydroxide 30 mL once at bedtime, desvenlafaxine 100 mg daily, nicotine inhalation as needed.   DIET: Regular.   ACTIVITY: As tolerated.   FOLLOWUP: With primary MD in 1 to 2 weeks after discharge from Behavioral Medicine.   TIME SPENT: 32 minutes.   ____________________________ Lacie ScottsShreyang H. Allena KatzPatel, MD shp:jcm D: 05/01/2013 15:42:04 ET T: 05/01/2013 16:07:33 ET JOB#: [id number]  cc: Asa Baudoin H. Allena KatzPatel, MD, <Dictator> Charise CarwinSHREYANG H Yetzali Weld MD ELECTRONICALLY SIGNED 05/04/2013 12:43

## 2014-08-17 NOTE — Consult Note (Signed)
PATIENT NAME:  Ariel White, Ariel White MR#:  [id number] DATE OF BIRTH:  [birth year]  DATE OF CONSULTATION:  05/01/2013  REFERRING PHYSICIAN:  Angelica Ran, MD CONSULTING PHYSICIAN:  Ardeen Fillers. Garnetta Buddy, MD  REASON FOR CONSULTATION: Drug overdose.   HISTORY OF PRESENT ILLNESS: The patient is a 54 year old divorced Caucasian female who was admitted after she presented due to an intentional overdose of her prescription medications including Xanax and trazodone. EMS was called by her family members including her brothers and sisters. In the Emergency Room, the patient was found to be somnolent although easily arousable. She was found to have an elevated salicylate level. The case was discussed with poison control and they started her on bicarbonate drip and serial monitoring.   During my interview, the patient was noted to be lying in the bed. She reported that she is feeling better today. She stated that she has a lot of things going on in her life. She stated that she has been taking care of her parents for the past 14 years and her father died last year in 09-12-12. Stated that she has been living in the same house and quit her job at [employer] as she was taking care of her parents. However, her brothers and sisters are now deciding for the house to be sold. She stated that her daddy wanted her to live in the same house as she took care of them for 14 years. She stated that she is under a lot of stress as she does not want to move out of the house. She stated that she decided to kill herself after she had a big argument with her brother and her sisters. She took a handful of her prescription medications including Xanax and trazodone. She knew that it would not be enough so she then tried to cut herself as well. She was feeling very depressed, hopeless, helpless and was crying. She called her son after she overdosed. She was brought to the hospital for observation and treatment. The patient reported that she has been  following with Dr. Fannie Knee  in [place] and he has been prescribing her medications to control her depression. Reported that she has history of personality disorder as well as PTSD. She is currently on the combination of Pristiq, Xanax, trazodone and Depakote which helps with her mood symptoms as well as seizures. The patient is unable to contract for safety at this time.   PAST PSYCHIATRIC HISTORY: The patient reported that she has attempted suicide at least 6 times in the past. She has attempted suicide by overdose and other matters. Her previous diagnoses include personality disorder, PTSD. She has been hospitalized multiple times to different area hospitals. 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 She denies current alcohol use.   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  CURRENT MEDICATIONS: Pristiq, Xanax, trazodone, Depakote and multivitamins.   SOCIAL HISTORY: The patient is currently divorced and lives by herself. She reported that her ex-husband was an alcoholic and he passed away 2 years ago in a car wreck. Her mother died 4 years ago in her father passed away last year. She is currently on disability and has applied to live in an apartment complex in [place]. She has a 73 year old son. The patient reported that she does not have any major issues with her son.   REVIEW OF SYSTEMS: CONSTITUTIONAL: Denies any fever or chills. No  weight changes.  EYES: No double or blurred vision.  ENT: No hearing loss.  RESPIRATORY: No shortness of breath or cough.  CARDIOVASCULAR: Denies any chest pain or orthopnea. GASTROINTESTINAL: No abdominal pain, nausea, vomiting or diarrhea.  GENITOURINARY: No incontinence or frequency.  ENDOCRINE: No heat or cold intolerance.  LYMPHATIC: No anemia or easy bruising.  INTEGUMENTARY: No acne or rash.  MUSCULOSKELETAL: No muscle or joint pain.    VITAL SIGNS: Temperature 98.2, pulse 87, respirations 18, blood pressure 96/50.  LABORATORY DATA: Glucose 109, bicarbonate 13, creatinine 0.97, sodium 145, potassium 3.9, chloride 114, bicarbonate 25, anion gap 6. Blood alcohol less than 3. Protein 6.3, albumin 2.5, alkaline phosphatase 58, AST 39, ALT 13. WBC 4.6, RBC 3.25, hemoglobin 11.5, hematocrit 33.6, platelet count 181, MCV 104, MCH 35.3.   MENTAL STATUS EXAMINATION: The patient is a moderately built female who was lying in the bed. She appeared calm and cooperative during the interview. She maintained good eye contact. Her mood was low and depressed. She was crying during the interview. Her affect was congruent. Thought process was tangential. Thought content was nondelusional. She currently denied having any suicidal or homicidal ideations or plan. She denied having any perceptual disturbances. She just attempted suicide by overdosing on the pills. She demonstrated poor insight and judgment.   DIAGNOSTIC IMPRESSION: AXIS I:  1.  Bipolar disorder, most recent episode mixed, moderate.  2.  History of posttraumatic stress disorder.  AXIS II: Personality disorder with borderline traits.  AXIS III: Please review the medical history.   TREATMENT PLAN: 1.  The patient is under involuntary commitment and will be transferred to the behavioral health unit for stabilization and safety.  2.  She will be continued on her medications including Depakote 500 mg p.o. at bedtime and Pristiq 100 mg p.o. daily. Treatment team to adjust her medications, and she will attend group and milieu therapy.   Thank you for allowing me to participate in the care of this patient.  ____________________________ Ardeen FillersUzma S. Garnetta BuddyFaheem, MD usf:sb D: 05/01/2013 13:04:38 ET T: 05/01/2013 13:23:08 ET JOB#: [id number]  cc: Ardeen FillersUzma S. Garnetta BuddyFaheem, MD, <Dictator> Rhunette CroftUZMA S Bonnita Newby MD ELECTRONICALLY SIGNED 05/10/2013 9:06

## 2014-08-17 NOTE — Discharge Summary (Signed)
PATIENT NAME:  Ariel White, [name] MR#:  [id number] DATE OF BIRTH:  [birth year]  DATE OF ADMISSION:  06/27/2013 DATE OF DISCHARGE:  07/01/2013  BRIEF HISTORY: Chauncey Manndna Gorelick is a 54 year old woman admitted through the Emergency Room after a fall. She complained of some right chest pain. She does have a history of EtOH and drug use in the past, but denied any alcohol use prior to admission. She had a right pneumothorax with at least 1 fractured ribs on the right lateral side. Her blood alcohol was 0.21 at the time of admission. She was admitted to the hospital and observed. No chest tube was placed. Her pneumothorax remained stable over the next several days. However, she had problems with her mental status related to her alcohol abuse and required delirium tremens prophylaxis. She remained stable and was discharged home on the 8th to be followed in the office in 7 to 10 days' time for follow-up chest x-ray.   DISCHARGE MEDICATIONS: Include divalproex sodium 500 mg once a day, trazodone 100 mg 2 tablets at bedtime, Pristiq 50 mg once a day, Valium 2 mg t.i.d., oxycodone 5 mg every 3 to 4 p.r.n., lorazepam 1 mg every 2 to 4 hours p.r.n. anxiety.   FINAL DISCHARGE DIAGNOSES:  1.  Traumatic pneumothorax.  2.  Multiple rib fractures.  3.  History of alcohol abuse.   ____________________________ Carmie Endalph L. Ely III, MD rle:sb D: 07/09/2013 21:22:59 ET T: 07/10/2013 07:34:19 ET JOB#: [id number]  cc: Carmie Endalph L. Ely III, MD, <Dictator> Jillene Bucksenny C. Arlana Pouchate, MD Quentin OreALPH L ELY MD ELECTRONICALLY SIGNED 07/10/2013 19:35

## 2014-11-01 ENCOUNTER — Encounter: Payer: Self-pay | Admitting: Radiology

## 2014-11-01 ENCOUNTER — Emergency Department
Admission: EM | Admit: 2014-11-01 | Discharge: 2014-11-01 | Disposition: A | Discharge status: Home / Self Care | Payer: Medicare Other | Attending: Emergency Medicine | Admitting: Emergency Medicine

## 2014-11-01 ENCOUNTER — Emergency Department: Payer: Medicare Other

## 2014-11-01 DIAGNOSIS — R51 Headache: Secondary | ICD-10-CM | POA: Diagnosis not present

## 2014-11-01 DIAGNOSIS — R112 Nausea with vomiting, unspecified: Secondary | ICD-10-CM | POA: Insufficient documentation

## 2014-11-01 DIAGNOSIS — R519 Headache, unspecified: Secondary | ICD-10-CM

## 2014-11-01 MED ORDER — KETOROLAC TROMETHAMINE 60 MG/2ML IM SOLN
INTRAMUSCULAR | Status: AC
  Administered 2014-11-01: 60 mg via INTRAMUSCULAR
  Filled 2014-11-01: qty 2

## 2014-11-01 MED ORDER — PROMETHAZINE HCL 25 MG/ML IJ SOLN
12.5000 mg | Freq: Once | INTRAMUSCULAR | Status: AC
  Administered 2014-11-01: 12.5 mg via INTRAMUSCULAR

## 2014-11-01 MED ORDER — BUTALBITAL-APAP-CAFFEINE 50-325-40 MG PO TABS: 1.0000 | ORAL_TABLET | Freq: Four times a day (QID) | ORAL | Status: DC | PRN

## 2014-11-01 MED ORDER — KETOROLAC TROMETHAMINE 60 MG/2ML IM SOLN
60.0000 mg | Freq: Once | INTRAMUSCULAR | Status: AC
  Administered 2014-11-01: 60 mg via INTRAMUSCULAR

## 2014-11-01 MED ORDER — PROMETHAZINE HCL 25 MG/ML IJ SOLN
INTRAMUSCULAR | Status: AC
  Administered 2014-11-01: 12.5 mg via INTRAMUSCULAR
  Filled 2014-11-01: qty 1

## 2014-11-01 MED ORDER — IBUPROFEN 800 MG PO TABS: 800.0000 mg | ORAL_TABLET | Freq: Three times a day (TID) | ORAL | Status: DC | PRN

## 2014-11-01 MED ORDER — MEPERIDINE HCL 25 MG/ML IJ SOLN
25.0000 mg | Freq: Once | INTRAMUSCULAR | Status: AC
  Administered 2014-11-01: 25 mg via INTRAMUSCULAR

## 2014-11-01 MED ORDER — MEPERIDINE HCL 25 MG/ML IJ SOLN
INTRAMUSCULAR | Status: AC
  Administered 2014-11-01: 25 mg via INTRAMUSCULAR
  Filled 2014-11-01: qty 1

## 2014-11-01 MED ORDER — PROMETHAZINE HCL 25 MG PO TABS: 25.0000 mg | ORAL_TABLET | Freq: Four times a day (QID) | ORAL | Status: DC | PRN

## 2014-11-01 NOTE — ED Provider Notes (Signed)
[facility] Emergency Department Provider Note  ____________________________________________  Time seen: Approximately 3:39 PM  I have reviewed the triage vital signs and the nursing notes.   HISTORY  Chief Complaint Migraine    HPI Ariel White is a 54 y.o. female who presents to the emergency room for evaluation of sudden onset of migraine headache. States that he has a previous history of migraines secondary to brain surgery but has not had one in years. Patient states that the onset occurred yesterday with purple and pink floaters. She reports having nausea and vomiting since last night. Has taken tramadol and hydrocodone without relief.   No past medical history on file.  There are no active problems to display for this patient.   No past surgical history on file.  Current Outpatient Rx  Name  Route  Sig  Dispense  Refill  . butalbital-acetaminophen-caffeine (FIORICET) 50-325-40 MG per tablet   Oral   Take 1-2 tablets by mouth every 6 (six) hours as needed for headache.   20 tablet   0   . ibuprofen (ADVIL,MOTRIN) 800 MG tablet   Oral   Take 1 tablet (800 mg total) by mouth every 8 (eight) hours as needed.   30 tablet   0   . promethazine (PHENERGAN) 25 MG tablet   Oral   Take 1 tablet (25 mg total) by mouth every 6 (six) hours as needed for nausea or vomiting.   12 tablet   0     Allergies Compazine; Ivp dye; and Imitrex  No family history on file.  Social History History  Substance Use Topics  . Smoking status: Not on file  . Smokeless tobacco: Not on file  . Alcohol Use: Not on file    Review of Systems  Constitutional: No fever/chills Eyes: No visual changes. ENT: No sore throat. Cardiovascular: Denies chest pain. Respiratory: Denies shortness of breath. Gastrointestinal: No abdominal pain.  No nausea, no vomiting.  No diarrhea.  No constipation. Genitourinary: Negative for dysuria. Musculoskeletal: Negative for  back pain. Skin: Negative for rash. Neurological: Negative for headaches, focal weakness or numbness.  10-point ROS otherwise negative.  ____________________________________________   PHYSICAL EXAM:  VITAL SIGNS: ED Triage Vitals  Enc Vitals Group     BP 11/01/14 1258 147/91 mmHg     Pulse Rate 11/01/14 1258 97     Resp 11/01/14 1258 16     Temp 11/01/14 1258 98 F (36.7 C)     Temp Source 11/01/14 1258 Oral     SpO2 11/01/14 1258 100 %     Weight 11/01/14 1258 120 lb (54.432 kg)     Height 11/01/14 1258 5\' 3"  (1.6 m)     Head Cir --      Peak Flow --      Pain Score 11/01/14 1315 10     Pain Loc --      Pain Edu? --      Excl. in GC? --     Constitutional: Alert and oriented. Well appearing and in no acute distress. Eyes: Conjunctivae are normal. PERRL. EOMI. fundoscopyunremarkable Head: Atraumatic. Nose: No congestion/rhinnorhea. Mouth/Throat: Mucous membranes are moist.  Oropharynx non-erythematous. Neck: No stridor.   Cardiovascular: Normal rate, regular rhythm. Grossly normal heart sounds.  Good peripheral circulation. Respiratory: Normal respiratory effort.  No retractions. Lungs CTAB. Gastrointestinal: Soft and nontender. No distention. No abdominal bruits. No CVA tenderness. Musculoskeletal: No lower extremity tenderness nor edema.  No joint effusions. Neurologic:  Normal speech  and language. No gross focal neurologic deficits are appreciated. Speech is normal. No gait instability. Skin:  Skin is warm, dry and intact. No rash noted. Psychiatric: Mood and affect are normal. Speech and behavior are normal.  ____________________________________________   LABS (all labs ordered are listed, but only abnormal results are displayed)  Labs Reviewed - No data to display ____________________________________________  EKG  Not applicable ____________________________________________  RADIOLOGY  CT was unremarkable no new changes from previous. Interpreted by  radiologist. ____________________________________________   PROCEDURES  Procedure(s) performed: None  Critical Care performed: No  ____________________________________________   INITIAL IMPRESSION / ASSESSMENT AND PLAN / ED COURSE  Pertinent labs & imaging results that were available during my care of the patient were reviewed by me and considered in my medical decision making (see chart for details).  Acute migraine headache. Patient was given Demerol 25 mg and Phenergan 12.5 mg IM. She was reexamined at 1635 with no relief in pain. patient at 435.  Will give her 60 mg of Toradol IM. June 22. Patient's headache is decreased from 6-7/10 and feeling much better. Desires to go home.  She voices no other emergency medical complaints at this time and will return with any worsening symptomology. ____________________________________________   FINAL CLINICAL IMPRESSION(S) / ED DIAGNOSES  Final diagnoses:  Headache disorder      Evangeline Dakin, PA-C 11/01/14 1722  Loleta Rose, MD 11/02/14 1500

## 2014-11-01 NOTE — ED Notes (Signed)
Pt states that she has a hx of migraines but states that she hasn't had one in a long time, states that she had brain tumor surgery in '85 and that left her with migraines, pt states that she started with this headache yesterday and states that she has blurred vision, nausea, and has vomited 3 times

## 2014-11-01 NOTE — Discharge Instructions (Signed)
Migraine Headache A migraine headache is an intense, throbbing pain on one or both sides of your head. A migraine can last for 30 minutes to several hours. CAUSES  The exact cause of a migraine headache is not always known. However, a migraine may be caused when nerves in the brain become irritated and release chemicals that cause inflammation. This causes pain. Certain things may also trigger migraines, such as:  Alcohol.  Smoking.  Stress.  Menstruation.  Aged cheeses.  Foods or drinks that contain nitrates, glutamate, aspartame, or tyramine.  Lack of sleep.  Chocolate.  Caffeine.  Hunger.  Physical exertion.  Fatigue.  Medicines used to treat chest pain (nitroglycerine), birth control pills, estrogen, and some blood pressure medicines. SIGNS AND SYMPTOMS  Pain on one or both sides of your head.  Pulsating or throbbing pain.  Severe pain that prevents daily activities.  Pain that is aggravated by any physical activity.  Nausea, vomiting, or both.  Dizziness.  Pain with exposure to bright lights, loud noises, or activity.  General sensitivity to bright lights, loud noises, or smells. Before you get a migraine, you may get warning signs that a migraine is coming (aura). An aura may include:  Seeing flashing lights.  Seeing bright spots, halos, or zigzag lines.  Having tunnel vision or blurred vision.  Having feelings of numbness or tingling.  Having trouble talking.  Having muscle weakness. DIAGNOSIS  A migraine headache is often diagnosed based on:  Symptoms.  Physical exam.  A CT scan or MRI of your head. These imaging tests cannot diagnose migraines, but they can help rule out other causes of headaches. TREATMENT Medicines may be given for pain and nausea. Medicines can also be given to help prevent recurrent migraines.  HOME CARE INSTRUCTIONS  Only take over-the-counter or prescription medicines for pain or discomfort as directed by your  health care provider. The use of long-term narcotics is not recommended.  Lie down in a dark, quiet room when you have a migraine.  Keep a journal to find out what may trigger your migraine headaches. For example, write down:  What you eat and drink.  How much sleep you get.  Any change to your diet or medicines.  Limit alcohol consumption.  Quit smoking if you smoke.  Get 7-9 hours of sleep, or as recommended by your health care provider.  Limit stress.  Keep lights dim if bright lights bother you and make your migraines worse. SEEK IMMEDIATE MEDICAL CARE IF:   Your migraine becomes severe.  You have a fever.  You have a stiff neck.  You have vision loss.  You have muscular weakness or loss of muscle control.  You start losing your balance or have trouble walking.  You feel faint or pass out.  You have severe symptoms that are different from your first symptoms. MAKE SURE YOU:   Understand these instructions.  Will watch your condition.  Will get help right away if you are not doing well or get worse. Document Released: 04/12/2005 Document Revised: 08/27/2013 Document Reviewed: 12/18/2012 [facility] Patient Information 2015 [place]. This information is not intended to replace advice given to you by your health care provider. Make sure you discuss any questions you have with your health care provider.  General Headache Without Cause A headache is pain or discomfort felt around the head or neck area. The specific cause of a headache may not be found. There are many causes and types of headaches. A few common ones  are:  Tension headaches.  Migraine headaches.  Cluster headaches.  Chronic daily headaches. HOME CARE INSTRUCTIONS   Keep all follow-up appointments with your caregiver or any specialist referral.  Only take over-the-counter or prescription medicines for pain or discomfort as directed by your caregiver.  Lie down in a dark, quiet room when  you have a headache.  Keep a headache journal to find out what may trigger your migraine headaches. For example, write down:  What you eat and drink.  How much sleep you get.  Any change to your diet or medicines.  Try massage or other relaxation techniques.  Put ice packs or heat on the head and neck. Use these 3 to 4 times per day for 15 to 20 minutes each time, or as needed.  Limit stress.  Sit up straight, and do not tense your muscles.  Quit smoking if you smoke.  Limit alcohol use.  Decrease the amount of caffeine you drink, or stop drinking caffeine.  Eat and sleep on a regular schedule.  Get 7 to 9 hours of sleep, or as recommended by your caregiver.  Keep lights dim if bright lights bother you and make your headaches worse. SEEK MEDICAL CARE IF:   You have problems with the medicines you were prescribed.  Your medicines are not working.  You have a change from the usual headache.  You have nausea or vomiting. SEEK IMMEDIATE MEDICAL CARE IF:   Your headache becomes severe.  You have a fever.  You have a stiff neck.  You have loss of vision.  You have muscular weakness or loss of muscle control.  You start losing your balance or have trouble walking.  You feel faint or pass out.  You have severe symptoms that are different from your first symptoms. MAKE SURE YOU:   Understand these instructions.  Will watch your condition.  Will get help right away if you are not doing well or get worse. Document Released: 04/12/2005 Document Revised: 07/05/2011 Document Reviewed: 04/28/2011 [facility] Patient Information 2015 [place], [place]. This information is not intended to replace advice given to you by your health care provider. Make sure you discuss any questions you have with your health care provider.

## 2014-11-01 NOTE — ED Notes (Signed)
Returned from CT scan.

## 2014-12-05 ENCOUNTER — Encounter: Payer: Self-pay | Admitting: Emergency Medicine

## 2014-12-05 ENCOUNTER — Emergency Department
Admission: EM | Admit: 2014-12-05 | Discharge: 2014-12-05 | Disposition: A | Discharge status: Home / Self Care | Payer: Medicare Other | Attending: Emergency Medicine | Admitting: Emergency Medicine

## 2014-12-05 DIAGNOSIS — S4991XA Unspecified injury of right shoulder and upper arm, initial encounter: Secondary | ICD-10-CM | POA: Diagnosis present

## 2014-12-05 DIAGNOSIS — X58XXXA Exposure to other specified factors, initial encounter: Secondary | ICD-10-CM | POA: Insufficient documentation

## 2014-12-05 DIAGNOSIS — Y998 Other external cause status: Secondary | ICD-10-CM | POA: Diagnosis not present

## 2014-12-05 DIAGNOSIS — S46911A Strain of unspecified muscle, fascia and tendon at shoulder and upper arm level, right arm, initial encounter: Secondary | ICD-10-CM | POA: Diagnosis not present

## 2014-12-05 DIAGNOSIS — Y93E5 Activity, floor mopping and cleaning: Secondary | ICD-10-CM | POA: Insufficient documentation

## 2014-12-05 DIAGNOSIS — Y92009 Unspecified place in unspecified non-institutional (private) residence as the place of occurrence of the external cause: Secondary | ICD-10-CM | POA: Diagnosis not present

## 2014-12-05 DIAGNOSIS — Z720 Tobacco use: Secondary | ICD-10-CM | POA: Insufficient documentation

## 2014-12-05 MED ORDER — CYCLOBENZAPRINE HCL 10 MG PO TABS
10.0000 mg | ORAL_TABLET | Freq: Three times a day (TID) | ORAL | Status: DC | PRN
  Administered 2014-12-05: 10 mg via ORAL
  Filled 2014-12-05: qty 1

## 2014-12-05 MED ORDER — CYCLOBENZAPRINE HCL 7.5 MG PO TABS: 7.5000 mg | ORAL_TABLET | Freq: Three times a day (TID) | ORAL | Status: DC | PRN

## 2014-12-05 NOTE — Discharge Instructions (Signed)
Shoulder Sprain  A shoulder sprain is the result of damage to the tough, fiber-like tissues (ligaments) that help hold your shoulder in place. The ligaments may be stretched or torn. Besides the main shoulder joint (the ball and socket), there are several smaller joints that connect the bones in this area. A sprain usually involves one of those joints. Most often it is the acromioclavicular (or AC) joint. That is the joint that connects the collarbone (clavicle) and the shoulder blade (scapula) at the top point of the shoulder blade (acromion).  A shoulder sprain is a mild form of what is called a shoulder separation. Recovering from a shoulder sprain may take some time. For some, pain lingers for several months. Most people recover without long term problems.  CAUSES    A shoulder sprain is usually caused by some kind of trauma. This might be:   Falling on an outstretched arm.   Being hit hard on the shoulder.   Twisting the arm.   Shoulder sprains are more likely to occur in people who:   Play sports.   Have balance or coordination problems.  SYMPTOMS    Pain when you move your shoulder.   Limited ability to move the shoulder.   Swelling and tenderness on top of the shoulder.   Redness or warmth in the shoulder.   Bruising.   A change in the shape of the shoulder.  DIAGNOSIS   Your healthcare provider may:   Ask about your symptoms.   Ask about recent activity that might have caused those symptoms.   Examine your shoulder. You may be asked to do simple exercises to test movement. The other shoulder will be examined for comparison.   Order some tests that provide a look inside the body. They can show the extent of the injury. The tests could include:   X-rays.   CT (computed tomography) scan.   MRI (magnetic resonance imaging) scan.  RISKS AND COMPLICATIONS   Loss of full shoulder motion.   Ongoing shoulder pain.  TREATMENT   How long it takes to recover from a shoulder sprain depends on how  severe it was. Treatment options may include:   Rest. You should not use the arm or shoulder until it heals.   Ice. For 2 or 3 days after the injury, put an ice pack on the shoulder up to 4 times a day. It should stay on for 15 to 20 minutes each time. Wrap the ice in a towel so it does not touch your skin.   Over-the-counter medicine to relieve pain.   A sling or brace. This will keep the arm still while the shoulder is healing.   Physical therapy or rehabilitation exercises. These will help you regain strength and motion. Ask your healthcare provider when it is OK to begin these exercises.   Surgery. The need for surgery is rare with a sprained shoulder, but some people may need surgery to keep the joint in place and reduce pain.  HOME CARE INSTRUCTIONS    Ask your healthcare provider about what you should and should not do while your shoulder heals.   Make sure you know how to apply ice to the correct area of your shoulder.   Talk with your healthcare provider about which medications should be used for pain and swelling.   If rehabilitation therapy will be needed, ask your healthcare provider to refer you to a therapist. If it is not recommended, then ask about at-home exercises. Find   Revised: 07/05/2011 Document Reviewed: 08/29/2008 [employer] Patient Information 2015 [place], [place]. This information is not intended to replace advice given to you by your health care provider. Make sure you discuss any questions you have with your health care provider.  Muscle Strain A muscle strain is an injury that occurs when a muscle is stretched beyond its normal length. Usually a small number of muscle fibers are torn when this happens. Muscle strain is rated in  degrees. First-degree strains have the least amount of muscle fiber tearing and pain. Second-degree and third-degree strains have increasingly more tearing and pain.  Usually, recovery from muscle strain takes 1-2 weeks. Complete healing takes 5-6 weeks.  CAUSES  Muscle strain happens when a sudden, violent force placed on a muscle stretches it too far. This may occur with lifting, sports, or a fall.  RISK FACTORS Muscle strain is especially common in athletes.  SIGNS AND SYMPTOMS At the site of the muscle strain, there may be:  Pain.  Bruising.  Swelling.  Difficulty using the muscle due to pain or lack of normal function. DIAGNOSIS  Your health care provider will perform a physical exam and ask about your medical history. TREATMENT  Often, the best treatment for a muscle strain is resting, icing, and applying cold compresses to the injured area.  HOME CARE INSTRUCTIONS   Use the PRICE method of treatment to promote muscle healing during the first 2-3 days after your injury. The PRICE method involves:  Protecting the muscle from being injured again.  Restricting your activity and resting the injured body part.  Icing your injury. To do this, put ice in a plastic bag. Place a towel between your skin and the bag. Then, apply the ice and leave it on from 15-20 minutes each hour. After the third day, switch to moist heat packs.  Apply compression to the injured area with a splint or elastic bandage. Be careful not to wrap it too tightly. This may interfere with blood circulation or increase swelling.  Elevate the injured body part above the level of your heart as often as you can.  Only take over-the-counter or prescription medicines for pain, discomfort, or fever as directed by your health care provider.  Warming up prior to exercise helps to prevent future muscle strains. SEEK MEDICAL CARE IF:   You have increasing pain or swelling in the injured area.  You have numbness,  tingling, or a significant loss of strength in the injured area. MAKE SURE YOU:   Understand these instructions.  Will watch your condition.  Will get help right away if you are not doing well or get worse. Document Released: 04/12/2005 Document Revised: 01/31/2013 Document Reviewed: 11/09/2012 [facility] Patient Information 2015 [place], [place]. This information is not intended to replace advice given to you by your health care provider. Make sure you discuss any questions you have with your health care provider.  Your exam is normal today, showing only strain to the muscles of your arm.  You should take the prescription muscle relaxant along with OTC Tylenol and Motrin for pain relief.  Follow-up with Dr. Arlana Pouch if your symptoms continue beyond a week.

## 2014-12-05 NOTE — ED Notes (Signed)
Patient to ED with c/o left upper arm pain, patient reports injury about a year ago. Reports that she was cleaning around her apartment today and believes she may have overdone it.

## 2014-12-05 NOTE — ED Provider Notes (Signed)
[facility] Emergency Department Provider Note ____________________________________________  Time seen: 2013  I have reviewed the triage vital signs and the nursing notes.  HISTORY  Chief Complaint  Arm Pain  HPI Ariel White is a 54 y.o. female, who is right-handed with c/o of increased pain to her right upper arm after cleaning house. She denies fall, direct trauma, or injury. She thinks she just "over did it"with the vacuum cleaner. She gives a history of right humeral fracture 1 year ago, for which she was discharged from her provider about 6 months ago. She denies catch, click, lock, give-way, or distal paresthesias. She reports pain to the biceps muscle described as throbbing. She reports her pain at 7/10 in triage.  History reviewed. No pertinent past medical history.  There are no active problems to display for this patient.   History reviewed. No pertinent past surgical history.  Current Outpatient Rx  Name  Route  Sig  Dispense  Refill  . butalbital-acetaminophen-caffeine (FIORICET) 50-325-40 MG per tablet   Oral   Take 1-2 tablets by mouth every 6 (six) hours as needed for headache.   20 tablet   0   . cyclobenzaprine (FEXMID) 7.5 MG tablet   Oral   Take 1 tablet (7.5 mg total) by mouth 3 (three) times daily as needed for muscle spasms.   20 tablet   0   . ibuprofen (ADVIL,MOTRIN) 800 MG tablet   Oral   Take 1 tablet (800 mg total) by mouth every 8 (eight) hours as needed.   30 tablet   0   . promethazine (PHENERGAN) 25 MG tablet   Oral   Take 1 tablet (25 mg total) by mouth every 6 (six) hours as needed for nausea or vomiting.   12 tablet   0    Allergies Compazine; Ivp dye; and Imitrex  History reviewed. No pertinent family history.  Social History Social History  Substance Use Topics  . Smoking status: Current Every Day Smoker  . Smokeless tobacco: None  . Alcohol Use: No   Review of Systems  Constitutional: Negative  for fever. Eyes: Negative for visual changes. ENT: Negative for sore throat. Cardiovascular: Negative for chest pain. Respiratory: Negative for shortness of breath. Gastrointestinal: Negative for abdominal pain, vomiting and diarrhea. Genitourinary: Negative for dysuria. Musculoskeletal: Negative for back pain. Right arm pain as above Skin: Negative for rash. Neurological: Negative for headaches, focal weakness or numbness. ____________________________________________  PHYSICAL EXAM:  VITAL SIGNS: ED Triage Vitals  Enc Vitals Group     BP 12/05/14 1817 126/72 mmHg     Pulse Rate 12/05/14 1817 96     Resp 12/05/14 1817 20     Temp 12/05/14 1817 98.6 F (37 C)     Temp Source 12/05/14 1817 Oral     SpO2 12/05/14 1817 96 %     Weight 12/05/14 1817 125 lb (56.7 kg)     Height 12/05/14 1817  (1.6 m)     Head Cir --      Peak Flow --      Pain Score 12/05/14 1818 7     Pain Loc --      Pain Edu? --      Excl. in GC? --    Constitutional: Alert and oriented. Well appearing and in no distress. Eyes: Conjunctivae are normal. PERRL. Normal extraocular movements. ENT   Head: Normocephalic and atraumatic.   Nose: No congestion/rhinnorhea.   Mouth/Throat: Mucous membranes are moist.  Neck: Supple. No thyromegaly. Hematological/Lymphatic/Immunilogical: No cervical lymphadenopathy. Cardiovascular: Normal rate, regular rhythm.  Respiratory: Normal respiratory effort. No wheezes/rales/rhonchi. Gastrointestinal: Soft and nontender. No distention. Musculoskeletal: No deformity, bruise, ecchymosis, or swelling to the RUE. Nontender with normal range of motion in all extremities, including full RUE range without deficit. Normal rotator cuff testing. Normal grip strength. Negative Yergason's. Normal composite fist. Normal grip strength. Neurologic:  CN II-XII grossly intact. Normal UE DTRs bilaterally. Normal gait without ataxia. Normal speech and language. No gross focal  neurologic deficits are appreciated. Skin:  Skin is warm, dry and intact. No rash noted. Psychiatric: Mood and affect are normal. Patient exhibits appropriate insight and judgment. ____________________________________________  PROCEDURES  Flexeril 10 mg PO ____________________________________________  INITIAL IMPRESSION / ASSESSMENT AND PLAN / ED COURSE  Acute strain to the right upper arm. No indication by mechanism of trauma, fracture or dislocation.  Suggest treatment with ibuprofen, Tylenol, and Flexeril. Apply ice to reduce symptoms. Follow-up with Dr. Dewaine Oats if symptoms persists.  ____________________________________________  FINAL CLINICAL IMPRESSION(S) / ED DIAGNOSES  Final diagnoses:  Strain of upper arm, right, initial encounter      Lissa Hoard, PA-C 12/07/14 1430  Loleta Rose, MD 12/08/14 1714

## 2015-01-31 ENCOUNTER — Encounter: Payer: Self-pay | Admitting: Emergency Medicine

## 2015-01-31 ENCOUNTER — Other Ambulatory Visit: Payer: Self-pay

## 2015-01-31 ENCOUNTER — Emergency Department
Admission: EM | Admit: 2015-01-31 | Discharge: 2015-02-01 | Disposition: A | Discharge status: Home / Self Care | Payer: Medicare Other | Attending: Student | Admitting: Student

## 2015-01-31 DIAGNOSIS — F1010 Alcohol abuse, uncomplicated: Secondary | ICD-10-CM

## 2015-01-31 DIAGNOSIS — Y998 Other external cause status: Secondary | ICD-10-CM | POA: Insufficient documentation

## 2015-01-31 DIAGNOSIS — Z720 Tobacco use: Secondary | ICD-10-CM | POA: Diagnosis not present

## 2015-01-31 DIAGNOSIS — F329 Major depressive disorder, single episode, unspecified: Secondary | ICD-10-CM | POA: Insufficient documentation

## 2015-01-31 DIAGNOSIS — F3289 Other specified depressive episodes: Secondary | ICD-10-CM | POA: Diagnosis not present

## 2015-01-31 DIAGNOSIS — F1014 Alcohol abuse with alcohol-induced mood disorder: Secondary | ICD-10-CM | POA: Diagnosis not present

## 2015-01-31 DIAGNOSIS — T391X2A Poisoning by 4-Aminophenol derivatives, intentional self-harm, initial encounter: Secondary | ICD-10-CM | POA: Diagnosis present

## 2015-01-31 DIAGNOSIS — Y9389 Activity, other specified: Secondary | ICD-10-CM | POA: Insufficient documentation

## 2015-01-31 DIAGNOSIS — Y9289 Other specified places as the place of occurrence of the external cause: Secondary | ICD-10-CM | POA: Insufficient documentation

## 2015-01-31 DIAGNOSIS — F32A Depression, unspecified: Secondary | ICD-10-CM

## 2015-01-31 HISTORY — DX: Bipolar disorder, unspecified: F31.9

## 2015-01-31 HISTORY — DX: Major depressive disorder, single episode, unspecified: F32.9

## 2015-01-31 HISTORY — DX: Depression, unspecified: F32.A

## 2015-01-31 HISTORY — DX: Anxiety disorder, unspecified: F41.9

## 2015-01-31 NOTE — ED Notes (Signed)
Pt to rm 16 via EMS from home.  EMS reports family called 911 concerned about pt.  EMS reports pt OD on tylenol PM, unknown amount.  Pt w/ depression related having family in [place] and pt worried.  Pt sleepy upon arrival.

## 2015-01-31 NOTE — ED Notes (Signed)
Sitter at bedside, [place], [place]

## 2015-01-31 NOTE — ED Provider Notes (Signed)
[facility] Emergency Department Provider Note  ____________________________________________  Time seen: Approximately 11:36 PM  I have reviewed the triage vital signs and the nursing notes.   HISTORY  Chief Complaint Drug Overdose    HPI Ariel White is a 54 y.o. female with history of bipolar disorder, substance abuse who presents for evaluation after intentional overdose which occurred suddenly just prior to arrival this evening at approximately 8:30 PM. The patient reports she took "a handful" of Tylenol PM because she was severely worried about her brother in [place] and the threat of hurricane there. She was very worried because he refused to evacuate and she reports "I just wanted to go to sleep so I could stop worrying so much". She takes that her son very cryptic text messages that stated that she was tired of living and that "by the time he gets this message it'll be too late for me". She has had no vomiting, diarrhea, fevers or chills. She has had anxious thoughts recently. Currently her symptoms are moderate. No modifying factors.   Past Medical History  Diagnosis Date  . Depression   . Anxiety   . Bipolar 1 disorder (HCC)     There are no active problems to display for this patient.   Past Surgical History  Procedure Laterality Date  . Brain tumor excision  1985    Current Outpatient Rx  Name  Route  Sig  Dispense  Refill  . butalbital-acetaminophen-caffeine (FIORICET) 50-325-40 MG per tablet   Oral   Take 1-2 tablets by mouth every 6 (six) hours as needed for headache.   20 tablet   0   . cyclobenzaprine (FEXMID) 7.5 MG tablet   Oral   Take 1 tablet (7.5 mg total) by mouth 3 (three) times daily as needed for muscle spasms.   20 tablet   0   . ibuprofen (ADVIL,MOTRIN) 800 MG tablet   Oral   Take 1 tablet (800 mg total) by mouth every 8 (eight) hours as needed.   30 tablet   0   . promethazine (PHENERGAN) 25 MG tablet  Oral   Take 1 tablet (25 mg total) by mouth every 6 (six) hours as needed for nausea or vomiting.   12 tablet   0     Allergies Compazine; Ivp dye; and Imitrex  History reviewed. No pertinent family history.  Social History Social History  Substance Use Topics  . Smoking status: Current Every Day Smoker -- 0.25 packs/day    Types: Cigarettes  . Smokeless tobacco: None  . Alcohol Use: No    Review of Systems Constitutional: No fever/chills Eyes: No visual changes. ENT: No sore throat. Cardiovascular: Denies chest pain. Respiratory: Denies shortness of breath. Gastrointestinal: No abdominal pain.  No nausea, no vomiting.  No diarrhea.  No constipation. Genitourinary: Negative for dysuria. Musculoskeletal: Negative for back pain. Skin: Negative for rash. Neurological: Negative for headaches, focal weakness or numbness.  10-point ROS otherwise negative.  ____________________________________________   PHYSICAL EXAM:  Filed Vitals:   01/31/15 2332 02/01/15 0026 02/01/15 0420  BP: 150/75 143/88 137/90  Pulse: 94 88 70  Temp: 98.6 F (37 C)    TempSrc: Oral    Resp: Height:  (1.6 m)    Weight: 130 lb (58.968 kg)    SpO2: 97% 96% 100%      Constitutional: Alert and oriented. Tearful at times but nontoxic-appearing and in no acute distress.  Eyes: Conjunctivae are normal.  PERRL. EOMI. Head: Atraumatic. Nose: No congestion/rhinnorhea. Mouth/Throat: Mucous membranes are moist.  Oropharynx non-erythematous. Neck: No stridor.   Cardiovascular: Normal rate, regular rhythm. Grossly normal heart sounds.  Good peripheral circulation. Respiratory: Normal respiratory effort.  No retractions. Lungs CTAB. Gastrointestinal: Soft and nontender. No distention. No abdominal bruits. No CVA tenderness. Genitourinary: deferred Musculoskeletal: No lower extremity tenderness nor edema.  No joint effusions. Neurologic:  Normal speech and language. No gross focal  neurologic deficits are appreciated. No gait instability. Skin:  Skin is warm, dry and intact. No rash noted. Psychiatric: Mood is labile and affect is expansive.  ____________________________________________   LABS (all labs ordered are listed, but only abnormal results are displayed)  Labs Reviewed  CBC WITH DIFFERENTIAL/PLATELET - Abnormal; Notable for the following:    MCV 102.2 (*)    MCH 34.6 (*)    Lymphs Abs 3.8 (*)    Eosinophils Absolute 0.8 (*)    All other components within normal limits  COMPREHENSIVE METABOLIC PANEL - Abnormal; Notable for the following:    Total Protein 8.4 (*)    All other components within normal limits  ETHANOL - Abnormal; Notable for the following:    Alcohol, Ethyl (B) 217 (*)    All other components within normal limits  ACETAMINOPHEN LEVEL - Abnormal; Notable for the following:    Acetaminophen (Tylenol), Serum 48 (*)    All other components within normal limits  VALPROIC ACID LEVEL - Abnormal; Notable for the following:    Valproic Acid Lvl <10 (*)    All other components within normal limits  LIPASE, BLOOD  APTT  PROTIME-INR  URINE DRUG SCREEN, QUALITATIVE ([facility] ONLY)  SALICYLATE LEVEL  ACETAMINOPHEN LEVEL   ____________________________________________  EKG  ED ECG REPORT I, Gayla Doss, the attending physician, personally viewed and interpreted this ECG.   Date: 02/01/2015  EKG Time: 23:47  Rate: 82  Rhythm: normal sinus rhythm  Axis: normal  Intervals:none  ST&T Change: No acute ST segment change.  ____________________________________________  RADIOLOGY  none ____________________________________________   PROCEDURES  Procedure(s) performed: None  Critical Care performed: No  ____________________________________________   INITIAL IMPRESSION / ASSESSMENT AND PLAN / ED COURSE  Pertinent labs & imaging results that were available during my care of the patient were reviewed by me and considered in my medical  decision making (see chart for details).  Ariel White is a 54 y.o. female with history of bipolar disorder, substance abuse who presents for evaluation after intentional overdose which occurred suddenly just prior to arrival this evening at approximately 8:30 PM. On exam, she is nontoxic appearing and in no acute distress. Vital signs stable, she is afebrile. She has no acute medical complaints and benign examination. I discussed the ninth events with her son on the phone, Ariel White, who does report that he is also concerned about her and is concerned that she may have been trying to end her life tonight. We discussed the nature of the text messages and I am concerned that this was an intentional overdose possibly with suicidal intent so I will place involuntary commitment, plan for screening labs, consult psychiatry and Behavioral Health. She denies any homicidal ideation or audiovisual hallucinations at this time.  ----------------------------------------- 12:53 AM on 02/01/2015 -----------------------------------------  At this time, the patient has become irate. She is very upset that I place involuntary commitment and I discussed with her that I'm concerned that she might have been trying to kill herself this evening. She is screaming loudly, refuses  to de-escalate despite much prompting, is posturing in a threatening way when staff approaches her.  She received 1 mg of Ativan IM as well as 5 mg of IM Haldol. Labs reviewed. Normal CBC, normal CMP, normal lipase. Alcohol elevated at 217. Undetectable salicylate level. Acetaminophen level is elevated at 48 however patient requires a repeat Tylenol level as this was drawn before the 4 hour mark.Care transferred to Dr. Langston Masker at this time as she will be moved to psych holding for further evaluation/monitoring.  ____________________________________________   FINAL CLINICAL IMPRESSION(S) / ED DIAGNOSES  Final diagnoses:  Intentional acetaminophen  overdose, initial encounter (HCC)      Gayla Doss, MD 02/01/15 [phone]

## 2015-02-01 DIAGNOSIS — F1010 Alcohol abuse, uncomplicated: Secondary | ICD-10-CM

## 2015-02-01 DIAGNOSIS — F329 Major depressive disorder, single episode, unspecified: Secondary | ICD-10-CM

## 2015-02-01 DIAGNOSIS — F1014 Alcohol abuse with alcohol-induced mood disorder: Secondary | ICD-10-CM | POA: Diagnosis not present

## 2015-02-01 DIAGNOSIS — F3289 Other specified depressive episodes: Secondary | ICD-10-CM

## 2015-02-01 DIAGNOSIS — F32A Depression, unspecified: Secondary | ICD-10-CM

## 2015-02-01 LAB — URINE DRUG SCREEN, QUALITATIVE (ARMC ONLY)
Amphetamines, Ur Screen: NOT DETECTED
BARBITURATES, UR SCREEN: NOT DETECTED
Benzodiazepine, Ur Scrn: NOT DETECTED
CANNABINOID 50 NG, UR ~~LOC~~: NOT DETECTED
Cocaine Metabolite,Ur ~~LOC~~: NOT DETECTED
MDMA (Ecstasy)Ur Screen: NOT DETECTED
METHADONE SCREEN, URINE: NOT DETECTED
Opiate, Ur Screen: NOT DETECTED
Phencyclidine (PCP) Ur S: NOT DETECTED
TRICYCLIC, UR SCREEN: NOT DETECTED

## 2015-02-01 LAB — CBC WITH DIFFERENTIAL/PLATELET
BASOS PCT: 1 %
Basophils Absolute: 0.1 10*3/uL (ref 0–0.1)
EOS PCT: 10 %
Eosinophils Absolute: 0.8 10*3/uL — ABNORMAL HIGH (ref 0–0.7)
HCT: 43.8 % (ref 35.0–47.0)
Hemoglobin: 14.8 g/dL (ref 12.0–16.0)
LYMPHS ABS: 3.8 10*3/uL — AB (ref 1.0–3.6)
Lymphocytes Relative: 48 %
MCH: 34.6 pg — AB (ref 26.0–34.0)
MCHC: 33.8 g/dL (ref 32.0–36.0)
MCV: 102.2 fL — ABNORMAL HIGH (ref 80.0–100.0)
Monocytes Absolute: 0.4 10*3/uL (ref 0.2–0.9)
Monocytes Relative: 5 %
NEUTROS PCT: 36 %
Neutro Abs: 2.9 10*3/uL (ref 1.4–6.5)
PLATELETS: 347 10*3/uL (ref 150–440)
RBC: 4.29 MIL/uL (ref 3.80–5.20)
RDW: 13.2 % (ref 11.5–14.5)
WBC: 8 10*3/uL (ref 3.6–11.0)

## 2015-02-01 LAB — COMPREHENSIVE METABOLIC PANEL
ALBUMIN: 4.7 g/dL (ref 3.5–5.0)
ALT: 14 U/L (ref 14–54)
ANION GAP: 10 (ref 5–15)
AST: 30 U/L (ref 15–41)
Alkaline Phosphatase: 100 U/L (ref 38–126)
BUN: 17 mg/dL (ref 6–20)
CHLORIDE: 108 mmol/L (ref 101–111)
CO2: 26 mmol/L (ref 22–32)
Calcium: 9.9 mg/dL (ref 8.9–10.3)
Creatinine, Ser: 0.88 mg/dL (ref 0.44–1.00)
GFR calc non Af Amer: >60 mL/min (ref >60)
GLUCOSE: 96 mg/dL (ref 65–99)
Potassium: 3.8 mmol/L (ref 3.5–5.1)
Sodium: 144 mmol/L (ref 135–145)
Total Bilirubin: 0.4 mg/dL (ref 0.3–1.2)
Total Protein: 8.4 g/dL — ABNORMAL HIGH (ref 6.5–8.1)

## 2015-02-01 LAB — ACETAMINOPHEN LEVEL
ACETAMINOPHEN (TYLENOL), SERUM: 48 ug/mL — AB (ref 10–30)
Acetaminophen (Tylenol), Serum: 19 ug/mL (ref 10–30)

## 2015-02-01 LAB — SALICYLATE LEVEL

## 2015-02-01 LAB — PROTIME-INR
INR: 0.88
PROTHROMBIN TIME: 12.1 s (ref 11.4–15.0)

## 2015-02-01 LAB — APTT: APTT: 30 s (ref 24–36)

## 2015-02-01 LAB — VALPROIC ACID LEVEL: Valproic Acid Lvl: <10 ug/mL — ABNORMAL LOW (ref 50.0–100.0)

## 2015-02-01 LAB — ETHANOL: ALCOHOL ETHYL (B): 217 mg/dL — AB (ref <5)

## 2015-02-01 LAB — LIPASE, BLOOD: Lipase: 37 U/L (ref 22–51)

## 2015-02-01 MED ORDER — HALOPERIDOL LACTATE 5 MG/ML IJ SOLN
INTRAMUSCULAR | Status: AC
  Filled 2015-02-01: qty 1

## 2015-02-01 MED ORDER — HALOPERIDOL LACTATE 5 MG/ML IJ SOLN
5.0000 mg | Freq: Once | INTRAMUSCULAR | Status: AC
  Administered 2015-02-01: 01:00:00 via INTRAMUSCULAR

## 2015-02-01 MED ORDER — LORAZEPAM 2 MG/ML IJ SOLN
INTRAMUSCULAR | Status: AC
Start: 2015-02-01 — End: 2015-02-01
  Filled 2015-02-01: qty 1

## 2015-02-01 MED ORDER — LORAZEPAM 2 MG/ML IJ SOLN
1.0000 mg | Freq: Once | INTRAMUSCULAR | Status: AC
  Administered 2015-02-01: 01:00:00 via INTRAMUSCULAR

## 2015-02-01 NOTE — ED Notes (Signed)
Report received from shannon, rn.

## 2015-02-01 NOTE — ED Notes (Signed)

## 2015-02-01 NOTE — ED Notes (Signed)
Pt sleeping, resps unlabored.  

## 2015-02-01 NOTE — ED Notes (Signed)
Pt increasing in aggression, verbally.  Pt pacing in room, throwing equipment on floor.  Multiple nurses and MD at bedside attempting to redirect pt.  Verbal order for medications received.

## 2015-02-01 NOTE — ED Notes (Signed)
BEHAVIORAL HEALTH ROUNDING Patient sleeping: No. Patient alert and oriented: yes Behavior appropriate: No.; If no, describe: pt cussing at staff and reporting she will sue hospital Nutrition and fluids offered: Yes  Toileting and hygiene offered: Yes  Sitter present: no Law enforcement present: Yes

## 2015-02-01 NOTE — BH Assessment (Signed)
Assessment Note  Ariel White is an 54 y.o. female presenting to the ED voluntarily after family members called EMS due to possible OD on Tylenol PM.  Pt reports she has been having difficulty sleeping and decide to take 4-5 Tylenol PM to help her sleep.  She states she has been feeling concerned about her brother, who lives in [place], and his decision to ride out the hurricane in [place].  Pt reports feeling overwhelmed with taking care of "everyone's" problems and needs.    Pt states she used to be on medications for Bipolar and decided to wean herself off.  Pt states she did not like the side effects and feels she has done well with not being on medications.  Pt denies SI/HI and any alcohol/drug use.  Diagnosis: Overdose  Past Medical History:  Past Medical History  Diagnosis Date  . Depression   . Anxiety   . Bipolar 1 disorder [facility])     Past Surgical History  Procedure Laterality Date  . Brain tumor excision  1985    Family History: History reviewed. No pertinent family history.  Social History:  reports that she has been smoking Cigarettes.  She has been smoking about 0.25 packs per day. She does not have any smokeless tobacco history on file. She reports that she does not drink alcohol. Her drug history is not on file.  Additional Social History:  Alcohol / Drug Use History of alcohol / drug use?: No history of alcohol / drug abuse (Pt denies alcohol use.)  CIWA: CIWA-Ar BP: (!) 143/88 mmHg Pulse Rate: 88 COWS:    Allergies:  Allergies  Allergen Reactions  . Compazine [Prochlorperazine Edisylate] Other (See Comments)    euphoria  . Ivp Dye [Iodinated Diagnostic Agents] Anaphylaxis    "stopped her heart"  . Imitrex [Sumatriptan] Swelling    "throat swelling"    Home Medications:  (Not in a hospital admission)  OB/GYN Status:  No LMP recorded. Patient has had a hysterectomy.  General Assessment Data Location of Assessment: [facility] ED TTS Assessment: In  system Is this a Tele or Face-to-Face Assessment?: Face-to-Face Is this an Initial Assessment or a Re-assessment for this encounter?: Initial Assessment Marital status: Single Maiden name: [name] Is patient pregnant?: No Pregnancy Status: No Living Arrangements: Alone Can pt return to current living arrangement?: Yes Admission Status: Voluntary Is patient capable of signing voluntary admission?: Yes Referral Source: Self/Family/Friend Insurance type: Medicare  Medical Screening Exam [facility] Walk-in ONLY) Medical Exam completed: Yes  Crisis Care Plan Living Arrangements: Alone Name of Psychiatrist: None reported Name of Therapist: None reported  Education Status Is patient currently in school?: No Current Grade: N/A Highest grade of school patient has completed: N/A Name of school: N/A Contact person: N/A  Risk to self with the past 6 months Suicidal Ideation: No Has patient been a risk to self within the past 6 months prior to admission? : No Suicidal Intent: No Has patient had any suicidal intent within the past 6 months prior to admission? : No Is patient at risk for suicide?: No Suicidal Plan?: No Has patient had any suicidal plan within the past 6 months prior to admission? : No Access to Means: Yes Specify Access to Suicidal Means: Pt has access to pills. What has been your use of drugs/alcohol within the last 12 months?: None reported Previous Attempts/Gestures: No How many times?: 0 Other Self Harm Risks: None Triggers for Past Attempts: None known Intentional Self Injurious Behavior: None Family Suicide  History: No Recent stressful life event(s): Other (Comment) (Pt reports concern about family members living in [place].) Persecutory voices/beliefs?: No Depression: No Substance abuse history and/or treatment for substance abuse?: No Suicide prevention information given to non-admitted patients: Not applicable  Risk to Others within the past 6 months Homicidal  Ideation: No Does patient have any lifetime risk of violence toward others beyond the six months prior to admission? : No Thoughts of Harm to Others: No Current Homicidal Intent: No Current Homicidal Plan: No Access to Homicidal Means: No Identified Victim: N/A History of harm to others?: No Assessment of Violence: None Noted Violent Behavior Description: N/A Does patient have access to weapons?: No Criminal Charges Pending?: No Does patient have a court date: No Is patient on probation?: No  Psychosis Hallucinations: None noted Delusions: None noted  Mental Status Report Appearance/Hygiene: In hospital gown Eye Contact: Good Motor Activity: Unremarkable Speech: Logical/coherent Level of Consciousness: Drowsy Mood: Anxious Affect: Anxious Anxiety Level: Minimal Thought Processes: Coherent Judgement: Partial Orientation: Person, Place, Situation, Time Obsessive Compulsive Thoughts/Behaviors: None  Cognitive Functioning Concentration: Good Memory: Recent Intact IQ: Average Insight: Fair Impulse Control: Fair Appetite: Good Weight Loss: 0 Weight Gain: 0 Sleep: Decreased Total Hours of Sleep: 4 Vegetative Symptoms: None  ADLScreening [facility] Assessment Services) Patient's cognitive ability adequate to safely complete daily activities?: Yes Patient able to express need for assistance with ADLs?: Yes Independently performs ADLs?: Yes (appropriate for developmental age)  Prior Inpatient Therapy Prior Inpatient Therapy: No Prior Therapy Dates: N/A Prior Therapy Facilty/Provider(s): N/A Reason for Treatment: N/A  Prior Outpatient Therapy Prior Outpatient Therapy: No Prior Therapy Dates: N/A Prior Therapy Facilty/Provider(s): N/A Reason for Treatment: N/A Does patient have an ACCT team?: No Does patient have Intensive In-House Services?  : No Does patient have [facility] services? : No Does patient have P4CC services?: No  ADL Screening (condition at time of  admission) Patient's cognitive ability adequate to safely complete daily activities?: Yes Patient able to express need for assistance with ADLs?: Yes Independently performs ADLs?: Yes (appropriate for developmental age)       Abuse/Neglect Assessment (Assessment to be complete while patient is alone) Physical Abuse: Denies Verbal Abuse: Denies Sexual Abuse: Denies Exploitation of patient/patient's resources: Denies Self-Neglect: Denies Values / Beliefs Cultural Requests During Hospitalization: None Spiritual Requests During Hospitalization: None Consults Spiritual Care Consult Needed: No Social Work Consult Needed: No Merchant navy officer (For Healthcare) Does patient have an advance directive?: No Would patient like information on creating an advanced directive?: No - patient declined information    Additional Information 1:1 In Past 12 Months?: No CIRT Risk: No Elopement Risk: No     Disposition:  Disposition Initial Assessment Completed for this Encounter: Yes Disposition of Patient: Other dispositions Other disposition(s): Other (Comment) (Psych MD consult)  On Site Evaluation by:   Reviewed with Physician:    Manus Rudd Asir Bingley 02/01/2015 12:50 AM

## 2015-02-01 NOTE — ED Notes (Signed)
BEHAVIORAL HEALTH ROUNDING Patient sleeping: No. Patient alert and oriented: yes Behavior appropriate: Yes.  ; If no, describe:  Nutrition and fluids offered: Yes  Toileting and hygiene offered: Yes  Sitter present: not applicable Law enforcement present: Yes  

## 2015-02-01 NOTE — ED Notes (Addendum)
ED BHU PLACEMENT JUSTIFICATION Is the patient under IVC or is there intent for IVC: Yes.   Is the patient medically cleared: No. Is there vacancy in the ED BHU: Yes.   Is the population mix appropriate for patient: No. Is the patient awaiting placement in inpatient or outpatient setting: No Has the patient had a psychiatric consult: No. Survey of unit performed for contraband, proper placement and condition of furniture, tampering with fixtures in bathroom, shower, and each patient room: Yes.  ; Findings:  APPEARANCE/BEHAVIOR calm and cooperative NEURO ASSESSMENT Orientation: time, place and person Hallucinations: No.None noted (Hallucinations) Speech: Normal Gait: normal RESPIRATORY ASSESSMENT Normal expansion.  Clear to auscultation.  No rales, rhonchi, or wheezing. CARDIOVASCULAR ASSESSMENT regular rate and rhythm, S1, S2 normal, no murmur, click, rub or gallop GASTROINTESTINAL ASSESSMENT soft, nontender, BS WNL, no r/g EXTREMITIES normal strength, tone, and muscle mass, no deformities, no erythema, induration, or nodules, ROM of all joints is normal PLAN OF CARE Provide calm/safe environment. Vital signs assessed twice daily. ED BHU Assessment once each 12-hour shift. Collaborate with intake RN daily or as condition indicates. Assure the ED provider has rounded once each shift. Provide and encourage hygiene. Provide redirection as needed. Assess for escalating behavior; address immediately and inform ED provider.  Assess family dynamic and appropriateness for visitation as needed: Yes.  ; If necessary, describe findings:  Educate the patient/family about BHU procedures/visitation: Yes.  ; If necessary, describe findings:   ENVIRONMENTAL ASSESSMENT Potentially harmful objects out of patient reach: Yes.   Personal belongings secured: Yes.   Patient dressed in hospital provided attire only: Yes.   Plastic bags out of patient reach: Yes.   Patient care equipment (cords, cables, call  bells, lines, and drains) shortened, removed, or accounted for: Yes.   Equipment and supplies removed from bottom of stretcher: Yes.   Potentially toxic materials out of patient reach: Yes.   Sharps container removed or out of patient reach: Yes.     Pt in in rm 16 on monitor due to post-od.  Pt and equipment visible from nurse's station with door open.  BEHAVIORAL HEALTH ROUNDING Patient sleeping: No. Patient alert and oriented: yes Behavior appropriate: Yes.  ; If no, describe:  Nutrition and fluids offered: No Toileting and hygiene offered: Yes  Sitter present: yes Law enforcement present: No

## 2015-02-01 NOTE — ED Provider Notes (Signed)
-----------------------------------------   7:10 AM on 02/01/2015 -----------------------------------------   Blood pressure 137/90, pulse 70, temperature 98.6 F (37 C), temperature source Oral, resp. rate 18, height  (1.6 m), weight 130 lb (58.968 kg), SpO2 100 %.  The patient had no acute events since last update.  Calm and cooperative at this time.  Disposition is pending per Psychiatry/Behavioral Medicine team recommendations.     Arnaldo Natal, MD 02/01/15 [phone]

## 2015-02-01 NOTE — ED Notes (Signed)
BEHAVIORAL HEALTH ROUNDING Patient sleeping: Yes.   Patient alert and oriented: pt sleeping Behavior appropriate: Yes.  ; If no, describe:   Nutrition and fluids offered: pt sleeping Toileting and hygiene offered: pt sleeping Sitter present: not applicable Law enforcement present: Yes  
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
## 2015-02-01 NOTE — ED Notes (Signed)
Pt telling at staff and is attempting close door and blinds. Staff informed pt this is not allowed. Pt continued to yell and cuss at staff.

## 2015-02-01 NOTE — ED Notes (Signed)
Report to kim, rn.  

## 2015-02-01 NOTE — ED Notes (Signed)
Pt spoke to sister via telephone in regards to d/c and pick up. Sister states will be here approx, 15 mins. Pt will be d/c once sister arrives. Pt given belongings bag back by ED tech at this time, pt changing. Pt calm and cooperative, no acute distress noted.

## 2015-02-01 NOTE — ED Notes (Signed)
BEHAVIORAL HEALTH ROUNDING Patient sleeping: Yes.   Patient alert and oriented: yes Behavior appropriate: Yes.  ;  Nutrition and fluids offered: Yes  Toileting and hygiene offered: Yes  Sitter present: no Law enforcement present: Yes  

## 2015-02-01 NOTE — ED Notes (Signed)
After medication administration, pt assisted to different stretcher and taken to quad for further monitoring and evaluation.

## 2015-02-01 NOTE — Discharge Instructions (Signed)
Acetaminophen Overdose When acetaminophen is used as directed, it is a very safe and effective medicine that can help relieve pain or fever. However, when taken in large doses, it can lead to an acetaminophen overdose. An overdose of this medicine can lead to serious problems, such as liver damage and death. The maximum recommended dosage of acetaminophen when taken out of the hospital is 3,000 mg a day in adults, or 5 doses a day in children. Acetaminophen is available in tablets that you can swallow, chew, or dissolve on your tongue. It is also available as a liquid or suppository. All types can be purchased over the counter. Acetaminophen is found in many other over-the-counter medicines, such as medicines for cough, cold, and flu. Acetaminophen is also contained in many prescription-strength pain medicines. Check all medicine labels for the presence and dosage of acetaminophen. Medicine labels may use abbreviations or other names for acetaminophen, such as APAP, AC, or Acetamin. One common brand name of acetaminophen is Tylenol.  CAUSES An acetaminophen overdose is caused by taking a larger-than-recommended dose of acetaminophen. SYMPTOMS Symptoms may not appear for hours or even days following the actual overdose. Symptoms may include:  Fatigue.  Abdominal pain.  Nausea or vomiting.  Confusion.  Loss of appetite.  Unexplained sweating.  Enlarged liver.  Yellowing of the skin (jaundice).  Convulsions.  Coma. DIAGNOSIS Your health care provider will perform a physical exam and ask about your medical history. Testing may be required and may include:  Blood tests to determine how much acetaminophen is in your blood.  Blood tests to look for liver problems.  Close monitoring of your blood pressure, pulse, temperature, and breathing.  Ultrasound or CT scan of your abdomen. TREATMENT Your treatment will depend on your test results and may include:  Emptying your  stomach.  Giving you activated charcoal by mouth.  Using medicines to treat your symptoms or reverse the effect of the acetaminophen. HOME CARE INSTRUCTIONS After you have been examined and treated by your health care provider:  Take medicines only as directed by your health care provider. Avoid any medicines that contain acetaminophen.  Follow up with your health care provider as directed. This is very important. You may need follow-up blood tests to check your liver.  Do not drink alcohol.  Drink enough fluid to keep your urine clear or pale yellow. SEEK IMMEDIATE MEDICAL CARE IF:  There has been an intentional or accidental ingestion of acetaminophen. A severe overdose may represent a serious problem that is an emergency. Do not wait to see if the symptoms will go away. Get medical help right away. Call your local emergency services (911 in the [place] Do not drive yourself to the hospital. If an overdose is suspected, you can also call the [facility] at [phone].   This information is not intended to replace advice given to you by your health care provider. Make sure you discuss any questions you have with your health care provider.   Document Released: 01/25/2014 Document Reviewed: 01/25/2014 Elsevier Interactive Patient Education [employer]   Please make follow up plans, as discussed with psychiatry here.  Return to the emergency department for thoughts of hurting yourself or anyone else, hallucinations or any other symptoms concerning to you.

## 2015-02-01 NOTE — ED Notes (Signed)
Pt removing monitoring equipment.  Dr. Inocencio Homes notified.  Dr. Inocencio Homes at bedside.

## 2015-03-15 ENCOUNTER — Emergency Department
Admission: EM | Admit: 2015-03-15 | Discharge: 2015-03-15 | Disposition: A | Discharge status: Home / Self Care | Payer: Medicare Other | Attending: Emergency Medicine | Admitting: Emergency Medicine

## 2015-03-15 ENCOUNTER — Encounter: Payer: Self-pay | Admitting: Emergency Medicine

## 2015-03-15 DIAGNOSIS — K0889 Other specified disorders of teeth and supporting structures: Secondary | ICD-10-CM | POA: Diagnosis not present

## 2015-03-15 DIAGNOSIS — K08409 Partial loss of teeth, unspecified cause, unspecified class: Secondary | ICD-10-CM | POA: Diagnosis not present

## 2015-03-15 DIAGNOSIS — F17210 Nicotine dependence, cigarettes, uncomplicated: Secondary | ICD-10-CM | POA: Diagnosis not present

## 2015-03-15 DIAGNOSIS — K029 Dental caries, unspecified: Secondary | ICD-10-CM | POA: Diagnosis not present

## 2015-03-15 MED ORDER — HYDROCODONE-ACETAMINOPHEN 5-325 MG PO TABS: 1.0000 | ORAL_TABLET | ORAL | Status: DC | PRN

## 2015-03-15 MED ORDER — CLINDAMYCIN HCL 300 MG PO CAPS: 300.0000 mg | ORAL_CAPSULE | Freq: Four times a day (QID) | ORAL | Status: DC

## 2015-03-15 MED ORDER — CHLORHEXIDINE GLUCONATE 0.12% ORAL RINSE (MEDLINE KIT): 15.0000 mL | Freq: Two times a day (BID) | OROMUCOSAL | Status: DC

## 2015-03-15 NOTE — ED Provider Notes (Signed)
[facility] Emergency Department Provider Note  ____________________________________________  Time seen: Approximately 8:57 AM  I have reviewed the triage vital signs and the nursing notes.   HISTORY  Chief Complaint Dental Pain    HPI Ariel Roysdna M White is a 54 y.o. female who had some dental extractions 6 days ago complaining of increased pain. Patient scheduled to have all her teeth extracted on December 6. Rates pain as a 10 over 10 at this time.   Past Medical History  Diagnosis Date  . Depression   . Anxiety   . Bipolar 1 disorder [facility](HCC)     Patient Active Problem List   Diagnosis Date Noted  . Depression, unspecified 02/01/2015  . Alcohol use disorder, mild, abuse 02/01/2015  . 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 Brain tumor excision  1985    Current Outpatient Rx  Name  Route  Sig  Dispense  Refill  . butalbital-acetaminophen-caffeine (FIORICET) 50-325-40 MG per tablet   Oral   Take 1-2 tablets by mouth every 6 (six) hours as needed for headache.   20 tablet   0   . chlorhexidine gluconate (PERIDEX) 0.12 % solution   Mouth/Throat   Use as directed 15 mLs in the mouth or throat 2 (two) times daily.   120 mL   0   . clindamycin (CLEOCIN) 300 MG capsule   Oral   Take 1 capsule (300 mg total) by mouth 4 (four) times daily.   28 capsule   0   . cyclobenzaprine (FEXMID) 7.5 MG tablet   Oral   Take 1 tablet (7.5 mg total) by mouth 3 (three) times daily as needed for muscle spasms.   20 tablet   0   . HYDROcodone-acetaminophen (NORCO) 5-325 MG tablet   Oral   Take 1-2 tablets by mouth every 4 (four) hours as needed for moderate pain.   15 tablet   0   . ibuprofen (ADVIL,MOTRIN) 800 MG tablet   Oral   Take 1 tablet (800 mg total) by mouth every 8 (eight) hours as needed.   30 tablet   0   . promethazine (PHENERGAN) 25 MG tablet  Oral   Take 1 tablet (25 mg total) by mouth every 6 (six) hours as needed for nausea or vomiting.   12 tablet   0     Allergies Compazine; Ivp dye; and Imitrex  History reviewed. No pertinent family history.  Social History Social History  Substance Use Topics  . Smoking status: Current Every Day Smoker -- 0.25 packs/day    Types: Cigarettes  . Smokeless tobacco: None  . Alcohol Use: No    Review of Systems Constitutional: No fever/chills Eyes: No visual changes. ENT: No sore throat. Positive for dental pain and multiple dental caries. Cardiovascular: Denies chest pain. Respiratory: Denies shortness of breath. Gastrointestinal: No abdominal pain.  No nausea, no vomiting.  No diarrhea.  No constipation. Genitourinary: Negative for dysuria. Musculoskeletal: Negative for back pain. Skin: Negative for rash. Neurological: Negative for headaches, focal weakness or numbness.  10-point ROS otherwise negative.  ____________________________________________   PHYSICAL EXAM:  VITAL SIGNS: ED Triage Vitals  Enc Vitals Group     BP 03/15/15 0852 133/84 mmHg     Pulse Rate 03/15/15 0852 110     Resp 03/15/15 0852 18     Temp 03/15/15 0852 98.4 F (36.9 C)     Temp  Source 03/15/15 0852 Oral     SpO2 03/15/15 0852 99 %     Weight 03/15/15 0852 115 lb (52.164 kg)     Height 03/15/15 0852  (1.6 m)     Head Cir --      Peak Flow --      Pain Score 03/15/15 0845 10     Pain Loc --      Pain Edu? --      Excl. in GC? --     Constitutional: Alert and oriented. Well appearing and in no acute distress. Eyes: Conjunctivae are normal. PERRL. EOMI. Head: Atraumatic. Nose: No congestion/rhinnorhea. Mouth/Throat: Mucous membranes are moist.  Oropharynx non-erythematous. Positive dental extraction notice some erythema around the gums. No obvious swelling or abscess appearing mass. Multiple dental caries scattered throughout the mouth. Neck: No stridor.   Cardiovascular: Normal  rate, regular rhythm. Grossly normal heart sounds.  Good peripheral circulation. Respiratory: Normal respiratory effort.  No retractions. Lungs CTAB. Musculoskeletal: No lower extremity tenderness nor edema.  No joint effusions. Neurologic:  Normal speech and language. No gross focal neurologic deficits are appreciated. No gait instability. Skin:  Skin is warm, dry and intact. No rash noted. Psychiatric: Mood and affect are normal. Speech and behavior are normal.  ____________________________________________   LABS (all labs ordered are listed, but only abnormal results are displayed)  Labs Reviewed - No data to display ____________________________________________   PROCEDURES  Procedure(s) performed: None  Critical Care performed: No  ____________________________________________   INITIAL IMPRESSION / ASSESSMENT AND PLAN / ED COURSE  Pertinent labs & imaging results that were available during my care of the patient were reviewed by me and considered in my medical decision making (see chart for details).  Dental extraction with secondary pain. Multiple dental caries with pain. Rx given for clindamycin 300 mg 4 times a day 7 days, chlorhexidine mouthwash, and hydrocodone as needed for pain. Patient to continue ibuprofen over-the-counter for pain control. Patient voices no other emergency medical complaints at this time. ____________________________________________   FINAL CLINICAL IMPRESSION(S) / ED DIAGNOSES  Final diagnoses:  Pain, dental      Evangeline Dakin, PA-C 03/15/15 0930  Myrna Blazer, MD 03/15/15 [phone]

## 2015-03-15 NOTE — ED Notes (Signed)
Pt to ed with c/o severe increased pain since having teeth pulled on Monday.

## 2015-03-15 NOTE — Discharge Instructions (Signed)
OPTIONS FOR DENTAL FOLLOW UP CARE ° °[place] Department of Health and Human Services - Local Safety Net Dental Clinics °http://www.ncdhhs.gov/dph/oralhealth/services/safetynetclinics.htm °  °[facility] (336-562-3123) ° °[facility] [place] (919-933-9087) ° °[facility] ([phone] ext [phone]) ° °[facility] ([phone]) ° °[facility] ([phone]) °This clinic caters to the indigent population and is on a lottery system. °Location: °[facility], [address], [place] °Clinic Hours: °Wednesdays from 6pm - 9pm, patients seen by a lottery system. °For dates, call or go to [email or web address]/[email or web address] °Services: °Cleanings, fillings and simple extractions. °Payment Options: °DENTAL WORK IS FREE OF CHARGE. Bring proof of income or support. °Best way to get seen: °Arrive at 5:15 pm - this is a lottery, NOT first come/first serve, so arriving earlier will not increase your chances of being seen. °  °  °[facility] °[phone] °Select option 1 for emergencies °  °Location: °[facility], [address], [place] °Clinic Hours: °No walk-ins accepted - call the day before to schedule an appointment. °Check in times are 9:30 am and 1:30 pm. °Services: °Simple extractions, temporary fillings, pulpectomy/pulp debridement, uncomplicated abscess drainage. °Payment Options: °PAYMENT IS DUE AT THE TIME OF SERVICE.  Fee is usually $100-200, additional surgical procedures (e.g. abscess drainage) may be extra. °Cash, checks, Visa/MasterCard accepted.  Can file Medicaid if patient is covered for dental - patient should call case worker to check. °No discount for [facility] patients. °Best way to get seen: °MUST call the day before and get onto the schedule. Can usually be seen the next 1-2 days. No walk-ins accepted. °  °  °[facility] °[phone] °   °Location: °[facility], [address], [place] °Clinic Hours: °M, W, Th, F 8am or 1:30pm, Tues 9a or 1:30 - first come/first served. °Services: °Simple extractions, temporary fillings, uncomplicated abscess drainage.  You do not need to be an [place] resident. °Payment Options: °PAYMENT IS DUE AT THE TIME OF SERVICE. °Dental insurance, otherwise sliding scale - bring proof of income or support. °Depending on income and treatment needed, cost is usually $50-200. °Best way to get seen: °Arrive early as it is first come/first served. °  °  °[facility] °[phone] °  °Location: °[address] °Clinic Hours: °Mon-Thu 8a-5p °Services: °Most basic dental services including extractions and fillings. °Payment Options: °PAYMENT IS DUE AT THE TIME OF SERVICE. °Sliding scale, up to 50% off - bring proof if income or support. °Medicaid with dental option accepted. °Best way to get seen: °Call to schedule an appointment, can usually be seen within 2 weeks OR they will try to see walk-ins - show up at 8a or 2p (you may have to wait). °  °  °[facility] °919-245-2435 °[place] RESIDENTS ONLY °  °Location: °[facility], [address], [place], [place] [address] °Clinic Hours: By appointment only. °Monday - Thursday 8am-5pm, Friday 8am-12pm °Services: Cleanings, fillings, extractions. °Payment Options: °PAYMENT IS DUE AT THE TIME OF SERVICE. °Cash, Visa or MasterCard. Sliding scale - $30 minimum per service. °Best way to get seen: °Come in to office, complete packet and make an appointment - need proof of income °or support monies for each household member and proof of [place] residence. °Usually takes about a month to get in. °  °  °[facility] °[phone] °  °Location: °[address]   [place] °Clinic Hours: Walk-in Urgent Care Dental Services are offered Monday-Friday  mornings only. °The numbers of emergencies accepted daily is limited to the number of °providers available. °Maximum 15 - Mondays, Wednesdays & Thursdays °Maximum 10 - Tuesdays & Fridays °Services: °You do not need to be a [place] resident to be seen for a dental emergency. °Emergencies are defined as pain, swelling, abnormal bleeding, or dental trauma. Walkins will receive x-rays if needed. °NOTE: Dental cleaning is not an emergency. °Payment Options: °PAYMENT IS DUE AT THE TIME OF SERVICE. °Minimum co-pay is $40.00 for uninsured patients. °Minimum co-pay is $3.00 for Medicaid with dental coverage. °Dental Insurance is accepted and must be presented at time of visit. °Medicare does not cover dental. °Forms of payment: Cash, credit card, checks. °Best way to get seen: °If not previously registered with the clinic, walk-in dental registration begins at 7:15 am and is on a first come/first serve basis. °If previously registered with the clinic, call to make an appointment. °  °  °[facility] °[phone] °[place] RESIDENTS ONLY °  °Location: °[address], [place], [place] °Clinic Hours: °Mon-Thu 10a-2p °Services: Extractions only! °Payment Options: °FREE (donations accepted) - bring proof of income or support °Best way to get seen: °Call and schedule an appointment OR come at 8am on the 1st Monday of every month (except for holidays) when it is first come/first served. °  °  °[facility] °[phone] °  °Location: °[address], [place] °Clinic Hours: °Friday mornings °Services, Payment Options, Best way to get seen: °Call for info °

## 2015-05-24 ENCOUNTER — Encounter: Payer: Self-pay | Admitting: Emergency Medicine

## 2015-05-24 ENCOUNTER — Emergency Department
Admission: EM | Admit: 2015-05-24 | Discharge: 2015-05-24 | Disposition: A | Discharge status: Home / Self Care | Payer: PPO | Attending: Emergency Medicine | Admitting: Emergency Medicine

## 2015-05-24 DIAGNOSIS — M542 Cervicalgia: Secondary | ICD-10-CM

## 2015-05-24 DIAGNOSIS — M545 Low back pain: Secondary | ICD-10-CM | POA: Diagnosis present

## 2015-05-24 DIAGNOSIS — F17210 Nicotine dependence, cigarettes, uncomplicated: Secondary | ICD-10-CM | POA: Insufficient documentation

## 2015-05-24 MED ORDER — HYDROCODONE-ACETAMINOPHEN 5-325 MG PO TABS: 1.0000 | ORAL_TABLET | ORAL | Status: DC | PRN

## 2015-05-24 MED ORDER — DICLOFENAC SODIUM 75 MG PO TBEC: 75.0000 mg | DELAYED_RELEASE_TABLET | Freq: Two times a day (BID) | ORAL | Status: DC

## 2015-05-24 MED ORDER — KETOROLAC TROMETHAMINE 60 MG/2ML IM SOLN
60.0000 mg | Freq: Once | INTRAMUSCULAR | Status: AC
  Administered 2015-05-24: 60 mg via INTRAMUSCULAR
  Filled 2015-05-24: qty 2

## 2015-05-24 MED ORDER — BACLOFEN 10 MG PO TABS: 10.0000 mg | ORAL_TABLET | Freq: Three times a day (TID) | ORAL | Status: DC

## 2015-05-24 MED ORDER — HYDROCODONE-ACETAMINOPHEN 5-325 MG PO TABS
2.0000 | ORAL_TABLET | Freq: Once | ORAL | Status: AC
  Administered 2015-05-24: 2 via ORAL
  Filled 2015-05-24: qty 2

## 2015-05-24 NOTE — ED Provider Notes (Signed)
[facility] Emergency Department Provider Note  ____________________________________________  Time seen: Approximately 11:04 AM  I have reviewed the triage vital signs and the nursing notes.   HISTORY  Chief Complaint Back Pain    HPI Ariel White is a 55 y.o. female or evaluation cervical neck pain and lumbar pain. Patient reports that she fell several years ago and has a cervical fracture lumbar fracture rib fractures. Patient states that the result of exacerbation of neck and back pain and desires referral to pain management. Ibuprofen has been used excessively over the last several days. She denies any headache or nausea or vomiting. In addition she denies any numbness or tingling. Pain today is rated as a 10 over 10.   Past Medical History  Diagnosis Date  . Depression   . Anxiety   . Bipolar 1 disorder [facility])     Patient Active Problem List   Diagnosis Date Noted  . Depression, unspecified 02/01/2015  . Alcohol use disorder, mild, abuse 02/01/2015  . 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 Brain tumor excision  1985    Current Outpatient Rx  Name  Route  Sig  Dispense  Refill  . baclofen (LIORESAL) 10 MG tablet   Oral   Take 1 tablet (10 mg total) by mouth 3 (three) times daily.   30 tablet   0   . diclofenac (VOLTAREN) 75 MG EC tablet   Oral   Take 1 tablet (75 mg total) by mouth 2 (two) times daily.   60 tablet   0   . HYDROcodone-acetaminophen (NORCO) 5-325 MG tablet   Oral   Take 1-2 tablets by mouth every 4 (four) hours as needed for moderate pain.   15 tablet   0   . promethazine (PHENERGAN) 25 MG tablet   Oral   Take 1 tablet (25 mg total) by mouth every 6 (six) hours as needed for nausea or vomiting.   12 tablet   0     Allergies Compazine; Ivp dye; and Imitrex  History reviewed. No pertinent family history.  Social  History Social History  Substance Use Topics  . Smoking status: Current Every Day Smoker -- 0.25 packs/day    Types: Cigarettes  . Smokeless tobacco: None  . Alcohol Use: No    Review of Systems Constitutional: No fever/chills Eyes: No visual changes. ENT: No sore throat. Cardiovascular: Denies chest pain. Respiratory: Denies shortness of breath. Gastrointestinal: No abdominal pain.  No nausea, no vomiting.  No diarrhea.  No constipation. Genitourinary: Negative for dysuria. Musculoskeletal: Positive for cervical and lumbar pain. Skin: Negative for rash. Neurological: Negative for headaches, focal weakness or numbness.  10-point ROS otherwise negative.  ____________________________________________   PHYSICAL EXAM:  VITAL SIGNS: ED Triage Vitals  Enc Vitals Group     BP 05/24/15 1007 129/93 mmHg     Pulse Rate 05/24/15 1007 100     Resp 05/24/15 1007 20     Temp 05/24/15 1007 98.6 F (37 C)     Temp Source 05/24/15 1007 Oral     SpO2 05/24/15 1007 98 %     Weight 05/24/15 1007 120 lb (54.432 kg)     Height 05/24/15 1007  (1.651 m)     Head Cir --      Peak Flow --      Pain Score 05/24/15 1008 10     Pain Loc --  Pain Edu? --      Excl. in GC? --     Constitutional: Alert and oriented. Well appearing and in no acute distress. Neck: No stridor.  Limited range of motion increased pain with lateralization flexion. Cardiovascular: Normal rate, regular rhythm. Grossly normal heart sounds.  Good peripheral circulation. Respiratory: Normal respiratory effort.  No retractions. Lungs CTAB. Musculoskeletal: No lower extremity tenderness nor edema.  No joint effusions. Point tenderness noted to the cervical spine and lumbar spine. Distal neurovascularly intact. Neurologic:  Normal speech and language. No gross focal neurologic deficits are appreciated. No gait instability. Skin:  Skin is warm, dry and intact. No rash noted. Psychiatric: Mood and affect are normal.  Speech and behavior are normal.  ____________________________________________   LABS (all labs ordered are listed, but only abnormal results are displayed)  Labs Reviewed - No data to display    PROCEDURES  Procedure(s) performed: None  Critical Care performed: No  ____________________________________________   INITIAL IMPRESSION / ASSESSMENT AND PLAN / ED COURSE  Pertinent labs & imaging results that were available during my care of the patient were reviewed by me and considered in my medical decision making (see chart for details).  Acute exacerbation of cervical and lumbar pain. Rx given for Vicodin 5/325. Encouraged continued use of baclofen 10 mg 3 times a day. Toradol 60 mg and Vicodin 5/325 given while in the ED with improvement. Patient referral given to pain management. ____________________________________________   FINAL CLINICAL IMPRESSION(S) / ED DIAGNOSES  Final diagnoses:  Cervical pain (neck)  Low back pain without sciatica, unspecified back pain laterality      Evangeline Dakin, PA-C 05/24/15 1151  Minna Antis, MD 05/24/15 1427

## 2015-05-24 NOTE — ED Notes (Signed)
Reports injury 2 years ago, today having flare up of neck pain and back pain

## 2015-05-24 NOTE — Discharge Instructions (Signed)

## 2015-06-12 ENCOUNTER — Emergency Department
Admission: EM | Admit: 2015-06-12 | Discharge: 2015-06-12 | Disposition: A | Discharge status: Home / Self Care | Payer: PPO | Attending: Emergency Medicine | Admitting: Emergency Medicine

## 2015-06-12 ENCOUNTER — Encounter: Payer: Self-pay | Admitting: Emergency Medicine

## 2015-06-12 DIAGNOSIS — M62838 Other muscle spasm: Secondary | ICD-10-CM | POA: Diagnosis not present

## 2015-06-12 DIAGNOSIS — G894 Chronic pain syndrome: Secondary | ICD-10-CM | POA: Insufficient documentation

## 2015-06-12 DIAGNOSIS — F17210 Nicotine dependence, cigarettes, uncomplicated: Secondary | ICD-10-CM | POA: Insufficient documentation

## 2015-06-12 DIAGNOSIS — M542 Cervicalgia: Secondary | ICD-10-CM | POA: Diagnosis present

## 2015-06-12 DIAGNOSIS — R5383 Other fatigue: Secondary | ICD-10-CM | POA: Insufficient documentation

## 2015-06-12 DIAGNOSIS — R0781 Pleurodynia: Secondary | ICD-10-CM | POA: Diagnosis not present

## 2015-06-12 HISTORY — DX: Other chronic pain: G89.29

## 2015-06-12 MED ORDER — BACLOFEN 10 MG PO TABS: 10.0000 mg | ORAL_TABLET | Freq: Three times a day (TID) | ORAL | Status: DC

## 2015-06-12 MED ORDER — HYDROCODONE-ACETAMINOPHEN 5-325 MG PO TABS
1.0000 | ORAL_TABLET | Freq: Once | ORAL | Status: AC
  Administered 2015-06-12: 1 via ORAL
  Filled 2015-06-12: qty 1

## 2015-06-12 MED ORDER — DICLOFENAC SODIUM 75 MG PO TBEC: 75.0000 mg | DELAYED_RELEASE_TABLET | Freq: Two times a day (BID) | ORAL | Status: DC

## 2015-06-12 MED ORDER — HYDROCODONE-ACETAMINOPHEN 5-325 MG PO TABS: 1.0000 | ORAL_TABLET | Freq: Four times a day (QID) | ORAL | Status: DC | PRN

## 2015-06-12 NOTE — Discharge Instructions (Signed)
Chronic Pain  Chronic pain can be defined as pain that is off and on and lasts for 3-6 months or longer. Many things cause chronic pain, which can make it difficult to make a diagnosis. There are many treatment options available for chronic pain. However, finding a treatment that works well for you may require trying various approaches until the right one is found. Many people benefit from a combination of two or more types of treatment to control their pain.  SYMPTOMS   Chronic pain can occur anywhere in the body and can range from mild to very severe. Some types of chronic pain include:  · Headache.  · Low back pain.  · Cancer pain.  · Arthritis pain.  · Neurogenic pain. This is pain resulting from damage to nerves.   People with chronic pain may also have other symptoms such as:  · Depression.  · Anger.  · Insomnia.  · Anxiety.  DIAGNOSIS   Your health care provider will help diagnose your condition over time. In many cases, the initial focus will be on excluding possible conditions that could be causing the pain. Depending on your symptoms, your health care provider may order tests to diagnose your condition. Some of these tests may include:   · Blood tests.    · CT scan.    · MRI.    · X-rays.    · Ultrasounds.    · Nerve conduction studies.    You may need to see a specialist.   TREATMENT   Finding treatment that works well may take time. You may be referred to a pain specialist. He or she may prescribe medicine or therapies, such as:   · Mindful meditation or yoga.  · Shots (injections) of numbing or pain-relieving medicines into the spine or area of pain.  · Local electrical stimulation.  · Acupuncture.    · Massage therapy.    · Aroma, color, light, or sound therapy.    · Biofeedback.    · Working with a physical therapist to keep from getting stiff.    · Regular, gentle exercise.    · Cognitive or behavioral therapy.    · Group support.    Sometimes, surgery may be recommended.   HOME CARE INSTRUCTIONS    · Take all medicines as directed by your health care provider.    · Lessen stress in your life by relaxing and doing things such as listening to calming music.    · Exercise or be active as directed by your health care provider.    · Eat a healthy diet and include things such as vegetables, fruits, fish, and lean meats in your diet.    · Keep all follow-up appointments with your health care provider.    · Attend a support group with others suffering from chronic pain.  SEEK MEDICAL CARE IF:   · Your pain gets worse.    · You develop a new pain that was not there before.    · You cannot tolerate medicines given to you by your health care provider.    · You have new symptoms since your last visit with your health care provider.    SEEK IMMEDIATE MEDICAL CARE IF:   · You feel weak.    · You have decreased sensation or numbness.    · You lose control of bowel or bladder function.    · Your pain suddenly gets much worse.    · You develop shaking.  · You develop chills.  · You develop confusion.  · You develop chest pain.  · You develop shortness of breath.    MAKE SURE YOU:  ·   Document Revised: 12/13/2012 Document Reviewed: 10/06/2012 Elsevier Interactive Patient Education 2016 [employer].  Pain Medicine Instructions HOW CAN PAIN MEDICINE AFFECT ME? You were prescribed pain medicine. This medicine may:  Make you tired or sleepy.  Affect how well you can:  Drive  Do certain activities. Pain medicine may not make all of your pain go away. You should be comfortable enough to:  Move.  Breathe.  Take care of yourself. HOW OFTEN SHOULD I TAKE PAIN MEDICINE AND HOW MUCH  SHOULD I TAKE?  Take pain medicine only as told by your doctor and only as needed for pain.  You do not need to take pain medicine if you are not having pain, unless your doctor tells you to do that.  You can take less than the prescribed dose if you find that less medicine helps your pain. WHAT SHOULD I AVOID WHILE I AM TAKING PAIN MEDICINE? Follow these instructions after you start taking pain medicine, while you are taking the medicine, and for 8 hours after you stop taking the medicine:  Do not drive.  Do not use machinery.  Do not use power tools.  Do not sign legal documents.  Do not drink alcohol.  Do not take sleeping pills.  Do not take care of children by yourself.  Do not do any activities that involve climbing or being in high places.  Do not go into any body of water unless there is an adult nearby who can watch and help you. This includes:  Lakes.  Rivers.  Oceans.  Spas.  Swimming pools. HOW CAN I KEEP OTHERS SAFE WHILE I AM TAKING PAIN MEDICINE?  Store your pain medicine as told by your doctor. Make sure that you keep it where children and pets cannot reach it.  Do not share your pain medicine with anyone.  Do not save any leftover pills. If you have any leftover pain medicine, get rid of it or destroy it as told by your doctor. WHAT ELSE DO I NEED TO KNOW ABOUT TAKING PAIN MEDICINE?  Use a poop (stool) softener if you have trouble pooping (constipation) because of your pain medicine. Eating more fruits and vegetables also helps with constipation.  Write down the times when you take your pain medicine. Look at the times before you take your next dose of medicine.  If your pain is very bad, do not take more pills than told by your doctor. Call your doctor for help.  Your pain medicine might have acetaminophen in it. Do not take any other acetaminophen while you are taking this medicine. An overdose of acetaminophen can do very bad damage to your  liver. If you are taking any medicines in addition to your pain medicine, check the active ingredients on those medicines to see if acetaminophen is listed. WHEN SHOULD I CALL MY DOCTOR?  Your medicine is not helping the pain.  You do either of these soon after you take the medicine:  Throw up (vomit).  Have watery poop (diarrhea).  You have new pain in areas that did not hurt before.  You have an allergic reaction to your medicine. This may include:  Feeling itchy.  Swelling.  Feeling dizzy.  Getting a new rash. WHEN SHOULD I CALL 911 OR GO TO THE EMERGENCY ROOM?  You feel dizzy or you faint.  You feel very confused.  You throw up again and again.  Your skin or lips turn pale or bluish in color.  You are:  Short of  breath.  Breathing much more slowly than usual.  You have a very bad allergic reaction to your medicine. This includes:  Developing a swollen tongue.  Having trouble breathing.   This information is not intended to replace advice given to you by your health care provider. Make sure you discuss any questions you have with your health care provider.   Document Released: 09/29/2007 Document Revised: 08/27/2014 Document Reviewed: 02/14/2014 Elsevier Interactive Patient Education [employer]

## 2015-06-12 NOTE — ED Provider Notes (Signed)
[facility] Emergency Department Provider Note  ____________________________________________  Time seen: Approximately 12:24 PM  I have reviewed the triage vital signs and the nursing notes.   HISTORY  Chief Complaint Neck Pain    HPI Ariel White is a 55 y.o. female , NAD, presents to the emergency department with complaints of chronic neck and rib pain. States she was seen in this emergency department about a month ago and given pain medications which significantly helped her pain. She was told to follow-up with pain management but unfortunately Dr. Letta Moynahan office would not accept referral from the emergency department and told the patient to establish with a primary care provider to gain a referral. Patient notes she has an appointment March 3 with a physician at [facility] to establish care. Patient presents today to gain a refill of previous medications to tide her over to her appointment in March. Has had no change in pain since her last visit. No injuries or traumas since that time. Denies any numbness, weakness, tingling. Pain is mainly in the neck and rib cage area.   Past Medical History  Diagnosis Date  . Depression   . Anxiety   . Bipolar 1 disorder (HCC)   . Chronic pain     Patient Active Problem List   Diagnosis Date Noted  . Depression, unspecified 02/01/2015  . Alcohol use disorder, mild, abuse 02/01/2015  . 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 Brain tumor excision  1985    Current Outpatient Rx  Name  Route  Sig  Dispense  Refill  . baclofen (LIORESAL) 10 MG tablet   Oral   Take 1 tablet (10 mg total) by mouth 3 (three) times daily.   90 tablet   0   . diclofenac (VOLTAREN) 75 MG EC tablet   Oral   Take 1 tablet (75 mg total) by mouth 2 (two) times daily.   60 tablet   0   . HYDROcodone-acetaminophen (NORCO) 5-325 MG  tablet   Oral   Take 1 tablet by mouth every 6 (six) hours as needed for severe pain.   15 tablet   0   . promethazine (PHENERGAN) 25 MG tablet   Oral   Take 1 tablet (25 mg total) by mouth every 6 (six) hours as needed for nausea or vomiting.   12 tablet   0     Allergies Compazine; Ivp dye; and Imitrex  No family history on file.  Social History Social History  Substance Use Topics  . Smoking status: Current Every Day Smoker -- 0.25 packs/day    Types: Cigarettes  . Smokeless tobacco: None  . Alcohol Use: No     Review of Systems  Constitutional: No fever/chills. Fatigue due to pain.  Eyes: No visual changes.  Cardiovascular: No chest pain. Respiratory: No cough. No shortness of breath. No wheezing.  Gastrointestinal: No abdominal pain.  No nausea, vomiting.  No diarrhea.  No constipation. Musculoskeletal: Positive for neck, rib pain. Negative for back pain.  Skin: Negative for rash, bruising, swelling. Neurological: Negative for headaches, focal weakness or numbness. 10-point ROS otherwise negative.  ____________________________________________   PHYSICAL EXAM:  VITAL SIGNS: ED Triage Vitals  Enc Vitals Group     BP 06/12/15 1151 113/80 mmHg     Pulse Rate 06/12/15 1151 105     Resp 06/12/15 1151 18     Temp 06/12/15  1151 98.1 F (36.7 C)     Temp Source 06/12/15 1151 Oral     SpO2 06/12/15 1151 100 %     Weight 06/12/15 1151 120 lb (54.432 kg)     Height 06/12/15 1151  (1.6 m)     Head Cir --      Peak Flow --      Pain Score 06/12/15 1151 10     Pain Loc --      Pain Edu? --      Excl. in GC? --     Constitutional: Alert and oriented. Well appearing and in no acute distress but is tearful. Eyes: Conjunctivae are normal. PERRL.  Head: Atraumatic. Neck: Cervical spine tenderness to palpation with moderate muscle spasm noted. FROM with some pain with full rotation bilaterally. Neck is supple.  Hematological/Lymphatic/Immunilogical: No  cervical lymphadenopathy. Cardiovascular:  Good peripheral circulation. Respiratory: Normal respiratory effort without tachypnea or retractions.  Musculoskeletal: No lower extremity tenderness nor edema.  No joint effusions. Neurologic:  Normal speech and language. No gross focal neurologic deficits are appreciated.  Skin:  Skin is warm, dry and intact. No rash noted. Psychiatric: Patient is tearful throughout encounter but appropriate thought process.  Speech and behavior are normal. Patient exhibits appropriate insight and judgement.   ____________________________________________   LABS  None  ____________________________________________  EKG  None ____________________________________________  RADIOLOGY  none ____________________________________________    PROCEDURES  Procedure(s) performed: None   Medications  HYDROcodone-acetaminophen (NORCO/VICODIN) 5-325 MG per tablet 1 tablet (1 tablet Oral Given 06/12/15 1316)    ___________________________________________   INITIAL IMPRESSION / ASSESSMENT AND PLAN / ED COURSE  Patient's diagnosis is consistent with chronic pain syndrome. Patient will be discharged home with prescriptions for Norco, baclofen and diclofenac to be taken as prescribed. Patient is to keep her appointment with [facility] clinic in line to establish care. If there is any change in mental schedule, she is to follow-up with [facility]. Patient is given ED precautions to return to the ED for any worsening or new symptoms.    ____________________________________________  FINAL CLINICAL IMPRESSION(S) / ED DIAGNOSES  Final diagnoses:  Chronic pain disorder      NEW MEDICATIONS STARTED DURING THIS VISIT:  New Prescriptions   BACLOFEN (LIORESAL) 10 MG TABLET    Take 1 tablet (10 mg total) by mouth 3 (three) times daily.   DICLOFENAC (VOLTAREN) 75 MG EC TABLET    Take 1 tablet (75 mg total) by mouth 2 (two) times daily.    HYDROCODONE-ACETAMINOPHEN (NORCO) 5-325 MG TABLET    Take 1 tablet by mouth every 6 (six) hours as needed for severe pain.         Hope Pigeon, PA-C 06/12/15 1401  Jennye Moccasin, MD 06/12/15 1409
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
## 2015-06-30 ENCOUNTER — Other Ambulatory Visit: Payer: Self-pay | Admitting: Adult Health

## 2015-06-30 DIAGNOSIS — Z1231 Encounter for screening mammogram for malignant neoplasm of breast: Secondary | ICD-10-CM

## 2015-07-05 ENCOUNTER — Emergency Department
Admission: EM | Admit: 2015-07-05 | Discharge: 2015-07-06 | Disposition: A | Discharge status: Home / Self Care | Payer: PPO | Attending: Emergency Medicine | Admitting: Emergency Medicine

## 2015-07-05 ENCOUNTER — Encounter: Payer: Self-pay | Admitting: Emergency Medicine

## 2015-07-05 ENCOUNTER — Emergency Department: Payer: PPO

## 2015-07-05 DIAGNOSIS — R42 Dizziness and giddiness: Secondary | ICD-10-CM

## 2015-07-05 DIAGNOSIS — F329 Major depressive disorder, single episode, unspecified: Secondary | ICD-10-CM | POA: Insufficient documentation

## 2015-07-05 DIAGNOSIS — Z90710 Acquired absence of both cervix and uterus: Secondary | ICD-10-CM | POA: Diagnosis not present

## 2015-07-05 DIAGNOSIS — Z79899 Other long term (current) drug therapy: Secondary | ICD-10-CM | POA: Insufficient documentation

## 2015-07-05 DIAGNOSIS — F17210 Nicotine dependence, cigarettes, uncomplicated: Secondary | ICD-10-CM | POA: Diagnosis not present

## 2015-07-05 LAB — CBC
HCT: 37.2 % (ref 35.0–47.0)
Hemoglobin: 12.6 g/dL (ref 12.0–16.0)
MCH: 34.8 pg — AB (ref 26.0–34.0)
MCHC: 33.9 g/dL (ref 32.0–36.0)
MCV: 102.8 fL — ABNORMAL HIGH (ref 80.0–100.0)
PLATELETS: 431 10*3/uL (ref 150–440)
RBC: 3.62 MIL/uL — AB (ref 3.80–5.20)
RDW: 15.6 % — ABNORMAL HIGH (ref 11.5–14.5)
WBC: 10 10*3/uL (ref 3.6–11.0)

## 2015-07-05 LAB — GLUCOSE, CAPILLARY: GLUCOSE-CAPILLARY: 106 mg/dL — AB (ref 65–99)

## 2015-07-05 LAB — BASIC METABOLIC PANEL
Anion gap: 8 (ref 5–15)
BUN: 20 mg/dL (ref 6–20)
CALCIUM: 8.8 mg/dL — AB (ref 8.9–10.3)
CO2: 22 mmol/L (ref 22–32)
CREATININE: 1.73 mg/dL — AB (ref 0.44–1.00)
Chloride: 112 mmol/L — ABNORMAL HIGH (ref 101–111)
GFR, EST AFRICAN AMERICAN: 38 mL/min — AB (ref >60)
GFR, EST NON AFRICAN AMERICAN: 33 mL/min — AB (ref >60)
Glucose, Bld: 121 mg/dL — ABNORMAL HIGH (ref 65–99)
Potassium: 4.1 mmol/L (ref 3.5–5.1)
Sodium: 142 mmol/L (ref 135–145)

## 2015-07-05 LAB — URINALYSIS COMPLETE WITH MICROSCOPIC (ARMC ONLY)
Bilirubin Urine: NEGATIVE
GLUCOSE, UA: NEGATIVE mg/dL
Ketones, ur: NEGATIVE mg/dL
Leukocytes, UA: NEGATIVE
NITRITE: NEGATIVE
Protein, ur: 100 mg/dL — AB
Specific Gravity, Urine: 1.016 (ref 1.005–1.030)
pH: 6 (ref 5.0–8.0)

## 2015-07-05 LAB — TROPONIN I

## 2015-07-05 MED ORDER — ONDANSETRON 4 MG PO TBDP
4.0000 mg | ORAL_TABLET | Freq: Once | ORAL | Status: AC
  Administered 2015-07-05: 4 mg via ORAL
  Filled 2015-07-05: qty 1

## 2015-07-05 MED ORDER — DIPHENHYDRAMINE HCL 25 MG PO CAPS
50.0000 mg | ORAL_CAPSULE | Freq: Once | ORAL | Status: AC
  Administered 2015-07-05: 50 mg via ORAL
  Filled 2015-07-05: qty 2
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
## 2015-07-05 NOTE — ED Notes (Signed)
Pt says earlier today, "when it was daylight" she took Ibuprofen and Baclofen-pt says she was told not to take Baclofen anymore and had been changed to Tramadol recently; could not find the tramadol so she took the 2 medications together this afternoon; fell asleep in her recliner when it was still daylight (has not idea what time it was) and woke when it was dark out feeling dizzy and unable to walk; pt presents to the ED talking in complete coherent sentences; pt says when she went to stand she fell to her right side; grips equal bilaterally;

## 2015-07-06 MED ORDER — PROMETHAZINE HCL 25 MG/ML IJ SOLN
INTRAMUSCULAR | Status: AC
  Administered 2015-07-06: 25 mg via INTRAMUSCULAR
  Filled 2015-07-06: qty 1

## 2015-07-06 MED ORDER — ONDANSETRON 4 MG PO TBDP
4.0000 mg | ORAL_TABLET | Freq: Once | ORAL | Status: AC
  Administered 2015-07-06: 4 mg via ORAL

## 2015-07-06 MED ORDER — ONDANSETRON 4 MG PO TBDP
ORAL_TABLET | ORAL | Status: AC
  Administered 2015-07-06: 4 mg via ORAL
  Filled 2015-07-06: qty 1

## 2015-07-06 MED ORDER — PROMETHAZINE HCL 25 MG/ML IJ SOLN
25.0000 mg | Freq: Once | INTRAMUSCULAR | Status: AC
  Administered 2015-07-06: 25 mg via INTRAMUSCULAR

## 2015-07-06 MED ORDER — TRAMADOL HCL 50 MG PO TABS: 50.0000 mg | ORAL_TABLET | Freq: Four times a day (QID) | ORAL | Status: DC | PRN

## 2015-07-06 NOTE — ED Notes (Signed)
Pt dry heaving, unable to keep zofran in mouth

## 2015-07-06 NOTE — Discharge Instructions (Signed)
Please seek medical attention for any high fevers, chest pain, shortness of breath, change in behavior, persistent vomiting, bloody stool or any other new or concerning symptoms. ° ° °Dizziness °Dizziness is a common problem. It makes you feel unsteady or lightheaded. You may feel like you are about to pass out (faint). Dizziness can lead to injury if you stumble or fall. Anyone can get dizzy, but dizziness is more common in older adults. This condition can be caused by a number of things, including: °· Medicines. °· Dehydration. °· Illness. °HOME CARE °Following these instructions may help with your condition: °Eating and Drinking °· Drink enough fluid to keep your pee (urine) clear or pale yellow. This helps to keep you from getting dehydrated. Try to drink more clear fluids, such as water. °· Do not drink alcohol. °· Limit how much caffeine you drink or eat if told by your doctor. °· Limit how much salt you drink or eat if told by your doctor. °Activity °· Avoid making quick movements. °¨ When you stand up from sitting in a chair, steady yourself until you feel okay. °¨ In the morning, first sit up on the side of the bed. When you feel okay, stand slowly while you hold onto something. Do this until you know that your balance is fine. °· Move your legs often if you need to stand in one place for a long time. Tighten and relax your muscles in your legs while you are standing. °· Do not drive or use heavy machinery if you feel dizzy. °· Avoid bending down if you feel dizzy. Place items in your home so that they are easy for you to reach without leaning over. °Lifestyle °· Do not use any tobacco products, including cigarettes, chewing tobacco, or electronic cigarettes. If you need help quitting, ask your doctor. °· Try to lower your stress level, such as with yoga or meditation. Talk with your doctor if you need help. °General Instructions °· Watch your dizziness for any changes. °· Take medicines only as told by  your doctor. Talk with your doctor if you think that your dizziness is caused by a medicine that you are taking. °· Tell a friend or a family member that you are feeling dizzy. If he or she notices any changes in your behavior, have this person call your doctor. °· Keep all follow-up visits as told by your doctor. This is important. °GET HELP IF: °· Your dizziness does not go away. °· Your dizziness or light-headedness gets worse. °· You feel sick to your stomach (nauseous). °· You have trouble hearing. °· You have new symptoms. °· You are unsteady on your feet or you feel like the room is spinning. °GET HELP RIGHT AWAY IF: °· You throw up (vomit) or have diarrhea and are unable to eat or drink anything. °· You have trouble: °¨ Talking. °¨ Walking. °¨ Swallowing. °¨ Using your arms, hands, or legs. °· You feel generally weak. °· You are not thinking clearly or you have trouble forming sentences. It may take a friend or family member to notice this. °· You have: °¨ Chest pain. °¨ Pain in your belly (abdomen). °¨ Shortness of breath. °¨ Sweating. °· Your vision changes. °· You are bleeding. °· You have a headache. °· You have neck pain or a stiff neck. °· You have a fever. °  °This information is not intended to replace advice given to you by your health care provider. Make sure you discuss any questions you   have with your health care provider. °  °Document Released: 04/01/2011 Document Revised: 08/27/2014 Document Reviewed: 04/08/2014 °[employer] Interactive Patient Education ©2016 [employer] ° °

## 2015-07-07 ENCOUNTER — Ambulatory Visit
Admission: RE | Admit: 2015-07-07 | Discharge: 2015-07-07 | Disposition: A | Discharge status: Home / Self Care | Payer: PPO | Source: Ambulatory Visit | Attending: Adult Health | Admitting: Adult Health

## 2015-07-07 DIAGNOSIS — Z1231 Encounter for screening mammogram for malignant neoplasm of breast: Secondary | ICD-10-CM | POA: Diagnosis not present

## 2015-07-08 LAB — URINE CULTURE

## 2015-08-04 ENCOUNTER — Emergency Department
Admission: EM | Admit: 2015-08-04 | Discharge: 2015-08-04 | Disposition: A | Discharge status: Home / Self Care | Payer: PPO | Attending: Emergency Medicine | Admitting: Emergency Medicine

## 2015-08-04 DIAGNOSIS — N390 Urinary tract infection, site not specified: Secondary | ICD-10-CM | POA: Insufficient documentation

## 2015-08-04 DIAGNOSIS — R197 Diarrhea, unspecified: Secondary | ICD-10-CM | POA: Diagnosis not present

## 2015-08-04 DIAGNOSIS — R1013 Epigastric pain: Secondary | ICD-10-CM

## 2015-08-04 DIAGNOSIS — F329 Major depressive disorder, single episode, unspecified: Secondary | ICD-10-CM | POA: Diagnosis not present

## 2015-08-04 DIAGNOSIS — F17210 Nicotine dependence, cigarettes, uncomplicated: Secondary | ICD-10-CM | POA: Insufficient documentation

## 2015-08-04 DIAGNOSIS — N189 Chronic kidney disease, unspecified: Secondary | ICD-10-CM | POA: Insufficient documentation

## 2015-08-04 DIAGNOSIS — R112 Nausea with vomiting, unspecified: Secondary | ICD-10-CM

## 2015-08-04 DIAGNOSIS — N289 Disorder of kidney and ureter, unspecified: Secondary | ICD-10-CM

## 2015-08-04 LAB — COMPREHENSIVE METABOLIC PANEL
ALK PHOS: 69 U/L (ref 38–126)
ALT: 10 U/L — ABNORMAL LOW (ref 14–54)
ANION GAP: 10 (ref 5–15)
AST: 22 U/L (ref 15–41)
Albumin: 4.4 g/dL (ref 3.5–5.0)
BILIRUBIN TOTAL: 0.5 mg/dL (ref 0.3–1.2)
BUN: 21 mg/dL — ABNORMAL HIGH (ref 6–20)
CALCIUM: 9.3 mg/dL (ref 8.9–10.3)
CO2: 14 mmol/L — ABNORMAL LOW (ref 22–32)
Chloride: 113 mmol/L — ABNORMAL HIGH (ref 101–111)
Creatinine, Ser: 1.37 mg/dL — ABNORMAL HIGH (ref 0.44–1.00)
GFR calc non Af Amer: 43 mL/min — ABNORMAL LOW (ref >60)
GFR, EST AFRICAN AMERICAN: 50 mL/min — AB (ref >60)
Glucose, Bld: 124 mg/dL — ABNORMAL HIGH (ref 65–99)
Potassium: 3.3 mmol/L — ABNORMAL LOW (ref 3.5–5.1)
Sodium: 137 mmol/L (ref 135–145)
TOTAL PROTEIN: 8.2 g/dL — AB (ref 6.5–8.1)

## 2015-08-04 LAB — URINALYSIS COMPLETE WITH MICROSCOPIC (ARMC ONLY)
Bilirubin Urine: NEGATIVE
Glucose, UA: 50 mg/dL — AB
KETONES UR: NEGATIVE mg/dL
NITRITE: NEGATIVE
PH: 5 (ref 5.0–8.0)
PROTEIN: 100 mg/dL — AB
SPECIFIC GRAVITY, URINE: 1.018 (ref 1.005–1.030)

## 2015-08-04 LAB — CBC
HCT: 43.3 % (ref 35.0–47.0)
HEMOGLOBIN: 14.5 g/dL (ref 12.0–16.0)
MCH: 34.6 pg — ABNORMAL HIGH (ref 26.0–34.0)
MCHC: 33.5 g/dL (ref 32.0–36.0)
MCV: 103.4 fL — ABNORMAL HIGH (ref 80.0–100.0)
Platelets: 447 10*3/uL — ABNORMAL HIGH (ref 150–440)
RBC: 4.18 MIL/uL (ref 3.80–5.20)
RDW: 15.1 % — ABNORMAL HIGH (ref 11.5–14.5)
WBC: 9.1 10*3/uL (ref 3.6–11.0)

## 2015-08-04 LAB — LIPASE, BLOOD: Lipase: 27 U/L (ref 11–51)

## 2015-08-04 MED ORDER — SODIUM CHLORIDE 0.9 % IV BOLUS (SEPSIS)
1000.0000 mL | Freq: Once | INTRAVENOUS | Status: AC
  Administered 2015-08-04: 1000 mL via INTRAVENOUS

## 2015-08-04 MED ORDER — POTASSIUM CHLORIDE CRYS ER 20 MEQ PO TBCR
40.0000 meq | EXTENDED_RELEASE_TABLET | Freq: Once | ORAL | Status: AC
  Administered 2015-08-04: 40 meq via ORAL
  Filled 2015-08-04: qty 2

## 2015-08-04 MED ORDER — ONDANSETRON 4 MG PO TBDP: 4.0000 mg | ORAL_TABLET | Freq: Three times a day (TID) | ORAL | Status: DC | PRN

## 2015-08-04 MED ORDER — ACETAMINOPHEN 500 MG PO TABS
1000.0000 mg | ORAL_TABLET | Freq: Once | ORAL | Status: AC
  Administered 2015-08-04: 1000 mg via ORAL
  Filled 2015-08-04: qty 2

## 2015-08-04 MED ORDER — ONDANSETRON HCL 4 MG/2ML IJ SOLN
4.0000 mg | Freq: Once | INTRAMUSCULAR | Status: AC
  Administered 2015-08-04: 4 mg via INTRAVENOUS
  Filled 2015-08-04: qty 2

## 2015-08-04 MED ORDER — CEPHALEXIN 500 MG PO CAPS
500.0000 mg | ORAL_CAPSULE | Freq: Once | ORAL | Status: AC
  Administered 2015-08-04: 500 mg via ORAL
  Filled 2015-08-04: qty 1

## 2015-08-04 MED ORDER — DIPHENOXYLATE-ATROPINE 2.5-0.025 MG PO TABS
1.0000 | ORAL_TABLET | Freq: Once | ORAL | Status: AC
  Administered 2015-08-04: 1 via ORAL
  Filled 2015-08-04: qty 1

## 2015-08-04 MED ORDER — LOPERAMIDE HCL 2 MG PO TABS: 2.0000 mg | ORAL_TABLET | Freq: Four times a day (QID) | ORAL | Status: DC | PRN

## 2015-08-04 MED ORDER — CEPHALEXIN 500 MG PO CAPS: 500.0000 mg | ORAL_CAPSULE | Freq: Four times a day (QID) | ORAL | Status: DC

## 2015-08-04 MED ORDER — ONDANSETRON HCL 4 MG/2ML IJ SOLN
4.0000 mg | Freq: Once | INTRAMUSCULAR | Status: AC | PRN
  Administered 2015-08-04: 4 mg via INTRAVENOUS
  Filled 2015-08-04: qty 2

## 2015-08-04 MED ORDER — HYDROMORPHONE HCL 1 MG/ML IJ SOLN
0.5000 mg | Freq: Once | INTRAMUSCULAR | Status: AC
  Administered 2015-08-04: 0.5 mg via INTRAVENOUS
  Filled 2015-08-04: qty 1

## 2015-08-04 NOTE — ED Provider Notes (Signed)
[facility] Emergency Department Provider Note  ____________________________________________  Time seen: Approximately 1:33 PM  I have reviewed the triage vital signs and the nursing notes.   HISTORY  Chief Complaint Emesis and Diarrhea    HPI Ariel White is a 55 y.o. female presenting with 7 days of nausea and vomiting, diarrhea, epigastric pain. The patient reports that over the last 7 days she has had multiple daily episodes of nausea and vomiting, nonbloody diarrhea. Several days after her symptoms started, she developed epigastric discomfort. She has not had any fever or chills, urinary symptoms, change in vaginal discharge, nor any sick contacts. She has not had any lightheadedness or syncope, cough or cold symptoms, shortness of breath or chest pain. She has tried Imodium without any improvement.   Past Medical History  Diagnosis Date  . Depression   . Anxiety   . Bipolar 1 disorder (HCC)   . Chronic pain     Patient Active Problem List   Diagnosis Date Noted  . Depression, unspecified 02/01/2015  . Alcohol use disorder, mild, abuse 02/01/2015  . 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 Brain tumor excision  1985  . Abdominal hysterectomy      Current Outpatient Rx  Name  Route  Sig  Dispense  Refill  . baclofen (LIORESAL) 10 MG tablet   Oral   Take 1 tablet (10 mg total) by mouth 3 (three) times daily.   90 tablet   0   . cephALEXin (KEFLEX) 500 MG capsule   Oral   Take 1 capsule (500 mg total) by mouth 4 (four) times daily.   20 capsule   0   . loperamide (IMODIUM A-D) 2 MG tablet   Oral   Take 1 tablet (2 mg total) by mouth 4 (four) times daily as needed for diarrhea or loose stools.   12 tablet   0   . ondansetron (ZOFRAN ODT) 4 MG disintegrating tablet   Oral   Take 1 tablet (4 mg total) by mouth every 8 (eight) hours as needed  for nausea or vomiting.   20 tablet   0   . traMADol (ULTRAM) 50 MG tablet   Oral   Take 1 tablet (50 mg total) by mouth every 6 (six) hours as needed.   20 tablet   0     Allergies Compazine; Ivp dye; and Imitrex  Family History  Problem Relation Age of Onset  . Breast cancer Mother     4670's    Social History Social History  Substance Use Topics  . Smoking status: Current Every Day Smoker -- 0.25 packs/day    Types: Cigarettes  . Smokeless tobacco: None  . Alcohol Use: No    Review of Systems Constitutional: No fever/chills.No lightheadedness or syncope. Eyes: No visual changes. No eye discharge. ENT: No sore throat. No congestion or rhinorrhea. No ear pain. Cardiovascular: Denies chest pain. Denies palpitations. Respiratory: Denies shortness of breath.  No cough. Gastrointestinal: Positive epigastric abdominal pain.  Positive nausea, positive vomiting.  Positive diarrhea.  No constipation. Genitourinary: Negative for dysuria. No vaginal discharge. Musculoskeletal: Negative for back pain. Skin: Negative for rash. Neurological: Negative for headaches. No focal numbness, tingling or weakness.   10-point ROS otherwise negative.  ____________________________________________   PHYSICAL EXAM:  VITAL SIGNS: ED Triage Vitals  Enc Vitals Group     BP 08/04/15 1132 124/78 mmHg  Pulse Rate 08/04/15 1132 114     Resp 08/04/15 1132 15     Temp 08/04/15 1132 98.3 F (36.8 C)     Temp Source 08/04/15 1132 Oral     SpO2 08/04/15 1132 98 %     Weight 08/04/15 1132 110 lb (49.896 kg)     Height 08/04/15 1132  (1.6 m)     Head Cir --      Peak Flow --      Pain Score 08/04/15 1132 9     Pain Loc --      Pain Edu? --      Excl. in GC? --     Constitutional: Alert and oriented. Chronically ill appearing but in no acute distress. Answers questions appropriately. Able to ambulate to the toilet without any difficulty. Eyes: Conjunctivae are normal.  EOMI. No  scleral icterus. Head: Atraumatic. Nose: No congestion/rhinnorhea. Mouth/Throat: Mucous membranes are moist.  Neck: No stridor.  Supple.   Cardiovascular: Normal rate, regular rhythm. No murmurs, rubs or gallops.  Respiratory: Normal respiratory effort.  No accessory muscle use or retractions. Lungs CTAB.  No wheezes, rales or ronchi. Gastrointestinal: Soft and nondistended.  I'll tenderness to palpation in the epigastric area. Negative Murphy sign. No guarding or rebound.  No peritoneal signs. Musculoskeletal: No LE edema. No ttp in the calves or palpable cords.  Negative Homan's sign. Neurologic:  A&Ox3.  Speech is clear.  Face and smile are symmetric.  EOMI.  Moves all extremities well. Skin:  Skin is warm, dry and intact. No rash noted. Psychiatric: Mood and affect are normal. Speech and behavior are normal.  Normal judgement.  ____________________________________________   LABS (all labs ordered are listed, but only abnormal results are displayed)  Labs Reviewed  COMPREHENSIVE METABOLIC PANEL - Abnormal; Notable for the following:    Potassium 3.3 (*)    Chloride 113 (*)    CO2 14 (*)    Glucose, Bld 124 (*)    BUN 21 (*)    Creatinine, Ser 1.37 (*)    Total Protein 8.2 (*)    ALT 10 (*)    GFR calc non Af Amer 43 (*)    GFR calc Af Amer 50 (*)    All other components within normal limits  CBC - Abnormal; Notable for the following:    MCV 103.4 (*)    MCH 34.6 (*)    RDW 15.1 (*)    Platelets 447 (*)    All other components within normal limits  URINALYSIS COMPLETEWITH MICROSCOPIC ([facility] ONLY) - Abnormal; Notable for the following:    Color, Urine YELLOW (*)    APPearance CLOUDY (*)    Glucose, UA 50 (*)    Hgb urine dipstick 1+ (*)    Protein, ur 100 (*)    Leukocytes, UA TRACE (*)    Bacteria, UA FEW (*)    Squamous Epithelial / LPF 0-5 (*)    All other components within normal limits  URINE CULTURE  LIPASE, BLOOD    ____________________________________________  EKG  ED ECG REPORT I, Rockne Menghini, the attending physician, personally viewed and interpreted this ECG.   Date: 08/04/2015  EKG Time: 1407  Rate: 87  Rhythm: normal sinus rhythm  Axis: normal  Intervals:none  ST&T Change: No STEMI  ____________________________________________  RADIOLOGY  No results found.  ____________________________________________   PROCEDURES  Procedure(s) performed: None  Critical Care performed: No ____________________________________________   INITIAL IMPRESSION / ASSESSMENT AND PLAN / ED COURSE  Pertinent labs & imaging results that were available during my care of the patient were reviewed by me and considered in my medical decision making (see chart for details).  55 y.o. with 1 week of nausea vomiting and diarrhea, inability to keep down fluids. Overall, the patient has some dry mucous membranes and appears mildly dehydrated with a minimal tachycardia but the rest of her vital signs are reassuring and she is afebrile. Her abdominal exam is reassuring and the most likely etiology is a viral GI illness versus a foodborne illness, I do not see evidence of acute intra-abdominal surgical pathology at this time. We'll get the remainder of her labs, and treat her symptomatically. Plan reevaluation.  ----------------------------------------- 2:54 PM on 08/04/2015 -----------------------------------------  Patient states that she is feeling significantly better except for a mild headache. Her nausea has completely resolved and she has had no further episodes of vomiting here. I'll plan to treat the patient's headache with Tylenol, and discharge her home. She will receive her first dose of Keflex for a urinary tract infection here. She has been given strict return precautions as well as follow-up instructions. ____________________________________________  FINAL CLINICAL IMPRESSION(S) / ED  DIAGNOSES  Final diagnoses:  UTI (lower urinary tract infection)  Nausea vomiting and diarrhea  Epigastric pain  Renal insufficiency      NEW MEDICATIONS STARTED DURING THIS VISIT:  New Prescriptions   CEPHALEXIN (KEFLEX) 500 MG CAPSULE    Take 1 capsule (500 mg total) by mouth 4 (four) times daily.   LOPERAMIDE (IMODIUM A-D) 2 MG TABLET    Take 1 tablet (2 mg total) by mouth 4 (four) times daily as needed for diarrhea or loose stools.   ONDANSETRON (ZOFRAN ODT) 4 MG DISINTEGRATING TABLET    Take 1 tablet (4 mg total) by mouth every 8 (eight) hours as needed for nausea or vomiting.     Rockne Menghini, MD 08/04/15 1459

## 2015-08-04 NOTE — ED Notes (Addendum)
Pt c/o N/V/D since last Monday/.. States it started with HA that has worsened.. States she has a hx of brain tumor/surgery

## 2015-08-04 NOTE — Discharge Instructions (Signed)
Please take a clear liquid diet for the next 24 hours, then advance to a bland BRAT diet as tolerated.  Please return to the emergency department if you develop severe pain, lightheadedness or fainting, fever, or any other symptoms concerning to you.

## 2015-08-04 NOTE — ED Notes (Signed)
Pt has 1/4 NS bolus left, informed pt she would be DC when bolus finished

## 2015-08-06 LAB — URINE CULTURE

## 2015-08-07 ENCOUNTER — Emergency Department: Payer: PPO

## 2015-08-07 ENCOUNTER — Emergency Department
Admission: EM | Admit: 2015-08-07 | Discharge: 2015-08-07 | Disposition: A | Discharge status: Home / Self Care | Payer: PPO | Attending: Emergency Medicine | Admitting: Emergency Medicine

## 2015-08-07 DIAGNOSIS — Z90710 Acquired absence of both cervix and uterus: Secondary | ICD-10-CM | POA: Insufficient documentation

## 2015-08-07 DIAGNOSIS — F1094 Alcohol use, unspecified with alcohol-induced mood disorder: Secondary | ICD-10-CM | POA: Diagnosis not present

## 2015-08-07 DIAGNOSIS — R51 Headache: Secondary | ICD-10-CM | POA: Diagnosis not present

## 2015-08-07 DIAGNOSIS — R519 Headache, unspecified: Secondary | ICD-10-CM

## 2015-08-07 DIAGNOSIS — F329 Major depressive disorder, single episode, unspecified: Secondary | ICD-10-CM | POA: Diagnosis not present

## 2015-08-07 DIAGNOSIS — F17210 Nicotine dependence, cigarettes, uncomplicated: Secondary | ICD-10-CM | POA: Insufficient documentation

## 2015-08-07 DIAGNOSIS — Z79899 Other long term (current) drug therapy: Secondary | ICD-10-CM | POA: Insufficient documentation

## 2015-08-07 DIAGNOSIS — G8929 Other chronic pain: Secondary | ICD-10-CM | POA: Diagnosis not present

## 2015-08-07 DIAGNOSIS — R1013 Epigastric pain: Secondary | ICD-10-CM | POA: Insufficient documentation

## 2015-08-07 DIAGNOSIS — R1110 Vomiting, unspecified: Secondary | ICD-10-CM | POA: Diagnosis present

## 2015-08-07 LAB — CBC
HEMATOCRIT: 40.3 % (ref 35.0–47.0)
Hemoglobin: 13.5 g/dL (ref 12.0–16.0)
MCH: 35 pg — ABNORMAL HIGH (ref 26.0–34.0)
MCHC: 33.6 g/dL (ref 32.0–36.0)
MCV: 104.2 fL — ABNORMAL HIGH (ref 80.0–100.0)
PLATELETS: 402 10*3/uL (ref 150–440)
RBC: 3.87 MIL/uL (ref 3.80–5.20)
RDW: 15 % — AB (ref 11.5–14.5)
WBC: 7.8 10*3/uL (ref 3.6–11.0)

## 2015-08-07 LAB — URINALYSIS COMPLETE WITH MICROSCOPIC (ARMC ONLY)
BILIRUBIN URINE: NEGATIVE
GLUCOSE, UA: NEGATIVE mg/dL
Hgb urine dipstick: NEGATIVE
KETONES UR: NEGATIVE mg/dL
LEUKOCYTES UA: NEGATIVE
NITRITE: NEGATIVE
Protein, ur: 30 mg/dL — AB
SPECIFIC GRAVITY, URINE: 1.023 (ref 1.005–1.030)
pH: 6 (ref 5.0–8.0)

## 2015-08-07 LAB — COMPREHENSIVE METABOLIC PANEL
ALT: 10 U/L — ABNORMAL LOW (ref 14–54)
AST: 21 U/L (ref 15–41)
Albumin: 4.2 g/dL (ref 3.5–5.0)
Alkaline Phosphatase: 76 U/L (ref 38–126)
Anion gap: 8 (ref 5–15)
BILIRUBIN TOTAL: 0.2 mg/dL — AB (ref 0.3–1.2)
BUN: 16 mg/dL (ref 6–20)
CALCIUM: 9.1 mg/dL (ref 8.9–10.3)
CO2: 17 mmol/L — ABNORMAL LOW (ref 22–32)
CREATININE: 1.01 mg/dL — AB (ref 0.44–1.00)
Chloride: 113 mmol/L — ABNORMAL HIGH (ref 101–111)
GFR calc Af Amer: >60 mL/min (ref >60)
Glucose, Bld: 99 mg/dL (ref 65–99)
POTASSIUM: 3.5 mmol/L (ref 3.5–5.1)
Sodium: 138 mmol/L (ref 135–145)
TOTAL PROTEIN: 7.8 g/dL (ref 6.5–8.1)

## 2015-08-07 LAB — LIPASE, BLOOD: Lipase: 25 U/L (ref 11–51)

## 2015-08-07 MED ORDER — SODIUM CHLORIDE 0.9 % IV BOLUS (SEPSIS)
1000.0000 mL | Freq: Once | INTRAVENOUS | Status: AC
  Administered 2015-08-07: 1000 mL via INTRAVENOUS

## 2015-08-07 MED ORDER — MAGNESIUM SULFATE IN D5W 10-5 MG/ML-% IV SOLN
1.0000 g | Freq: Once | INTRAVENOUS | Status: AC
  Administered 2015-08-07: 1 g via INTRAVENOUS
  Filled 2015-08-07: qty 100

## 2015-08-07 MED ORDER — ACETAMINOPHEN 325 MG PO TABS
650.0000 mg | ORAL_TABLET | Freq: Once | ORAL | Status: AC
  Administered 2015-08-07: 650 mg via ORAL
  Filled 2015-08-07: qty 2

## 2015-08-07 NOTE — ED Notes (Signed)
Called pharmacy for medication

## 2015-08-07 NOTE — ED Provider Notes (Signed)
[facility] Emergency Department Provider Note    ____________________________________________  Time seen: ~1510  I have reviewed the triage vital signs and the nursing notes.   HISTORY  Chief Complaint Emesis and Abdominal Pain   History limited by: Not Limited   HPI Ariel White is a 55 y.o. female who presents to the emergency department today because of concerns for continued epigastric pain nausea and vomiting. The patient states that these symptoms started over one week ago. She states that time she also had diarrhea. She states that the epigastric pain is worse after eating. She feels like she is unable to keep any food down. She was seen in the emergency department 3 days ago for the same symptoms. She states she was given prescription for Imodium and Phenergan. Since that time her diarrhea has stopped however she has had continued nausea and vomiting. She states she feels like the Phenergan helps for a little while but then she will start having these symptoms again. She states that multiple members of her family have had gallbladder disease. She denies any fevers.   Past Medical History  Diagnosis Date  . Depression   . Anxiety   . Bipolar 1 disorder (HCC)   . Chronic pain     Patient Active Problem List   Diagnosis Date Noted  . Depression, unspecified 02/01/2015  . Alcohol use disorder, mild, abuse 02/01/2015  . 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 Brain tumor excision  1985  . Abdominal hysterectomy      Current Outpatient Rx  Name  Route  Sig  Dispense  Refill  . baclofen (LIORESAL) 10 MG tablet   Oral   Take 1 tablet (10 mg total) by mouth 3 (three) times daily.   90 tablet   0   . cephALEXin (KEFLEX) 500 MG capsule   Oral   Take 1 capsule (500 mg total) by mouth 4 (four) times daily.   20 capsule   0   . loperamide (IMODIUM  A-D) 2 MG tablet   Oral   Take 1 tablet (2 mg total) by mouth 4 (four) times daily as needed for diarrhea or loose stools.   12 tablet   0   . ondansetron (ZOFRAN ODT) 4 MG disintegrating tablet   Oral   Take 1 tablet (4 mg total) by mouth every 8 (eight) hours as needed for nausea or vomiting.   20 tablet   0   . traMADol (ULTRAM) 50 MG tablet   Oral   Take 1 tablet (50 mg total) by mouth every 6 (six) hours as needed.   20 tablet   0     Allergies Compazine; Ivp dye; and Imitrex  Family History  Problem Relation Age of Onset  . Breast cancer Mother     59's    Social History Social History  Substance Use Topics  . Smoking status: Current Every Day Smoker -- 0.25 packs/day    Types: Cigarettes  . Smokeless tobacco: None  . Alcohol Use: No    Review of Systems  Constitutional: Negative for fever. Cardiovascular: Negative for chest pain. Respiratory: Negative for shortness of breath. Gastrointestinal: Positive epigastric pain, vomiting and nausea Neurological: Negative for headaches, focal weakness or numbness.  10-point ROS otherwise negative.  ____________________________________________   PHYSICAL EXAM:  VITAL SIGNS: ED Triage Vitals  Enc Vitals Group     BP 08/07/15  1358 138/69 mmHg     Pulse Rate 08/07/15 1358 107     Resp 08/07/15 1358 18     Temp 08/07/15 1358 98.3 F (36.8 C)     Temp Source 08/07/15 1358 Oral     SpO2 08/07/15 1358 100 %     Weight 08/07/15 1358 110 lb (49.896 kg)     Height 08/07/15 1358 5\' 3"  (1.6 m)     Head Cir --      Peak Flow --      Pain Score 08/07/15 1359 8   Constitutional: Alert and oriented. Well appearing and in no distress. Eyes: Conjunctivae are normal. PERRL. Normal extraocular movements. ENT   Head: Normocephalic and atraumatic.   Nose: No congestion/rhinnorhea.   Mouth/Throat: Mucous membranes are moist.   Neck: No stridor. Hematological/Lymphatic/Immunilogical: No cervical  lymphadenopathy. Cardiovascular: Tachycardic, regular rhythm.  No murmurs, rubs, or gallops. Respiratory: Normal respiratory effort without tachypnea nor retractions. Breath sounds are clear and equal bilaterally. No wheezes/rales/rhonchi. Gastrointestinal: Soft and nontender. No distention. There is no CVA tenderness. Genitourinary: Deferred Musculoskeletal: Normal range of motion in all extremities. No joint effusions.  No lower extremity tenderness nor edema. Neurologic:  Normal speech and language. No gross focal neurologic deficits are appreciated.  Skin:  Skin is warm, dry and intact. No rash noted. Psychiatric: Mood and affect are normal. Speech and behavior are normal. Patient exhibits appropriate insight and judgment.  ____________________________________________    LABS (pertinent positives/negatives)  Labs Reviewed  COMPREHENSIVE METABOLIC PANEL - Abnormal; Notable for the following:    Chloride 113 (*)    CO2 17 (*)    Creatinine, Ser 1.01 (*)    ALT 10 (*)    Total Bilirubin 0.2 (*)    All other components within normal limits  CBC - Abnormal; Notable for the following:    MCV 104.2 (*)    MCH 35.0 (*)    RDW 15.0 (*)    All other components within normal limits  URINALYSIS COMPLETEWITH MICROSCOPIC ([facility] ONLY) - Abnormal; Notable for the following:    Color, Urine YELLOW (*)    APPearance CLEAR (*)    Protein, ur 30 (*)    Bacteria, UA RARE (*)    Squamous Epithelial / LPF 0-5 (*)    All other components within normal limits  LIPASE, BLOOD     ____________________________________________   EKG  None  ____________________________________________    RADIOLOGY  CT head IMPRESSION: Stable postoperative changes.  No acute intracranial abnormalities.  US abdomen RUQ IMPRESSION: Study within normal limits.   ____________________________________________   PROCEDURES  Procedure(s) performed: None  Critical Care performed:  No  ____________________________________________   INITIAL IMPRESSION / ASSESSMENT AND PLAN / ED COURSE  Pertinent labs & imaging results that were available during my care of the patient were reviewed by me and considered in my medical decision making (see chart for details).  Patient presented to the emergency department today because of concerns for continued nausea vomiting and epigastric pain. No vomiting here in the emergency department that I witnessed. Patient did not appear to be in any significant distress. I did obtain a right upper quadrant ultrasound given female a little history of gallstones. This was unremarkable. Furthermore a CT of the head was obtained given patient's concern for headache and history of previous Rein tumor. This also was on concerning. Did attempt to treat the patient's headaches. Patient however chose to leave the emergency department before full pain control can be obtained.  Will give patient GI follow-up given vomiting and epigastric pain.  ____________________________________________   FINAL CLINICAL IMPRESSION(S) / ED DIAGNOSES  Final diagnoses:  Epigastric pain  Headache, unspecified headache type     Phineas Semen, MD 08/07/15 2057

## 2015-08-07 NOTE — Discharge Instructions (Signed)
Please seek medical attention for any high fevers, chest pain, shortness of breath, change in behavior, persistent vomiting, bloody stool or any other new or concerning symptoms. ° ° °Nausea and Vomiting °Nausea is a sick feeling that often comes before throwing up (vomiting). Vomiting is a reflex where stomach contents come out of your mouth. Vomiting can cause severe loss of body fluids (dehydration). Children and elderly adults can become dehydrated quickly, especially if they also have diarrhea. Nausea and vomiting are symptoms of a condition or disease. It is important to find the cause of your symptoms. °CAUSES  °· Direct irritation of the stomach lining. This irritation can result from increased acid production (gastroesophageal reflux disease), infection, food poisoning, taking certain medicines (such as nonsteroidal anti-inflammatory drugs), alcohol use, or tobacco use. °· Signals from the brain. These signals could be caused by a headache, heat exposure, an inner ear disturbance, increased pressure in the brain from injury, infection, a tumor, or a concussion, pain, emotional stimulus, or metabolic problems. °· An obstruction in the gastrointestinal tract (bowel obstruction). °· Illnesses such as diabetes, hepatitis, gallbladder problems, appendicitis, kidney problems, cancer, sepsis, atypical symptoms of a heart attack, or eating disorders. °· Medical treatments such as chemotherapy and radiation. °· Receiving medicine that makes you sleep (general anesthetic) during surgery. °DIAGNOSIS °Your caregiver may ask for tests to be done if the problems do not improve after a few days. Tests may also be done if symptoms are severe or if the reason for the nausea and vomiting is not clear. Tests may include: °· Urine tests. °· Blood tests. °· Stool tests. °· Cultures (to look for evidence of infection). °· X-rays or other imaging studies. °Test results can help your caregiver make decisions about treatment or the  need for additional tests. °TREATMENT °You need to stay well hydrated. Drink frequently but in small amounts. You may wish to drink water, sports drinks, clear broth, or eat frozen ice pops or gelatin dessert to help stay hydrated. When you eat, eating slowly may help prevent nausea. There are also some antinausea medicines that may help prevent nausea. °HOME CARE INSTRUCTIONS  °· Take all medicine as directed by your caregiver. °· If you do not have an appetite, do not force yourself to eat. However, you must continue to drink fluids. °· If you have an appetite, eat a normal diet unless your caregiver tells you differently. °¨ Eat a variety of complex carbohydrates (rice, wheat, potatoes, bread), lean meats, yogurt, fruits, and vegetables. °¨ Avoid high-fat foods because they are more difficult to digest. °· Drink enough water and fluids to keep your urine clear or pale yellow. °· If you are dehydrated, ask your caregiver for specific rehydration instructions. Signs of dehydration may include: °¨ Severe thirst. °¨ Dry lips and mouth. °¨ Dizziness. °¨ Dark urine. °¨ Decreasing urine frequency and amount. °¨ Confusion. °¨ Rapid breathing or pulse. °SEEK IMMEDIATE MEDICAL CARE IF:  °· You have blood or brown flecks (like coffee grounds) in your vomit. °· You have black or bloody stools. °· You have a severe headache or stiff neck. °· You are confused. °· You have severe abdominal pain. °· You have chest pain or trouble breathing. °· You do not urinate at least once every 8 hours. °· You develop cold or clammy skin. °· You continue to vomit for longer than 24 to 48 hours. °· You have a fever. °MAKE SURE YOU:  °· Understand these instructions. °· Will watch your condition. °· Will get help right away   if you are not doing well or get worse. °  °This information is not intended to replace advice given to you by your health care provider. Make sure you discuss any questions you have with your health care provider. °    °Document Released: 04/12/2005 Document Revised: 07/05/2011 Document Reviewed: 09/09/2010 °Elsevier Interactive Patient Education ©2016 [employer] ° °

## 2015-08-07 NOTE — ED Notes (Signed)
Pt still c/o headache, MD made aware.

## 2015-08-07 NOTE — ED Notes (Signed)
MD at bedside. 

## 2015-08-07 NOTE — ED Notes (Signed)
Pt to ultrasound

## 2015-08-07 NOTE — ED Notes (Signed)
Pt c/o continous N/V with epigastric pain since seen here on Monday.Ariel White. She was seen then for N/V/D since the previous Monday.. States she does not have any more diarrhea but continues to have the other sx.

## 2015-08-07 NOTE — ED Notes (Signed)
Pt c/o headache, MD made aware 

## 2015-08-28 ENCOUNTER — Ambulatory Visit: Admission: RE | Admit: 2015-08-28 | Payer: PPO | Source: Ambulatory Visit | Admitting: Unknown Physician Specialty

## 2015-08-28 ENCOUNTER — Encounter: Admission: RE | Payer: Self-pay | Source: Ambulatory Visit

## 2015-08-28 SURGERY — COLONOSCOPY WITH PROPOFOL
Anesthesia: General

## 2015-08-29 ENCOUNTER — Telehealth: Payer: Self-pay | Admitting: *Deleted

## 2015-08-29 ENCOUNTER — Encounter: Payer: Self-pay | Admitting: Anesthesiology

## 2015-08-29 ENCOUNTER — Encounter (INDEPENDENT_AMBULATORY_CARE_PROVIDER_SITE_OTHER): Payer: Self-pay

## 2015-08-29 ENCOUNTER — Ambulatory Visit: Payer: PPO | Attending: Anesthesiology | Admitting: Anesthesiology

## 2015-08-29 VITALS — BP 161/96 | HR 92 | Temp 98.3°F | Resp 16 | Ht 63.0 in | Wt 106.0 lb

## 2015-08-29 DIAGNOSIS — G8929 Other chronic pain: Secondary | ICD-10-CM | POA: Diagnosis not present

## 2015-08-29 DIAGNOSIS — F17210 Nicotine dependence, cigarettes, uncomplicated: Secondary | ICD-10-CM | POA: Diagnosis not present

## 2015-08-29 DIAGNOSIS — M546 Pain in thoracic spine: Secondary | ICD-10-CM | POA: Diagnosis not present

## 2015-08-29 DIAGNOSIS — M25511 Pain in right shoulder: Secondary | ICD-10-CM | POA: Diagnosis not present

## 2015-08-29 DIAGNOSIS — M542 Cervicalgia: Secondary | ICD-10-CM | POA: Diagnosis not present

## 2015-08-29 DIAGNOSIS — M7591 Shoulder lesion, unspecified, right shoulder: Secondary | ICD-10-CM | POA: Insufficient documentation

## 2015-08-29 DIAGNOSIS — R52 Pain, unspecified: Secondary | ICD-10-CM | POA: Diagnosis present

## 2015-08-29 DIAGNOSIS — M5030 Other cervical disc degeneration, unspecified cervical region: Secondary | ICD-10-CM

## 2015-08-29 DIAGNOSIS — M791 Myalgia: Secondary | ICD-10-CM | POA: Diagnosis not present

## 2015-08-29 DIAGNOSIS — F329 Major depressive disorder, single episode, unspecified: Secondary | ICD-10-CM | POA: Insufficient documentation

## 2015-08-29 DIAGNOSIS — M25519 Pain in unspecified shoulder: Secondary | ICD-10-CM | POA: Insufficient documentation

## 2015-08-29 DIAGNOSIS — K219 Gastro-esophageal reflux disease without esophagitis: Secondary | ICD-10-CM | POA: Insufficient documentation

## 2015-08-29 DIAGNOSIS — M7918 Myalgia, other site: Secondary | ICD-10-CM | POA: Insufficient documentation

## 2015-08-29 NOTE — Progress Notes (Signed)
Safety precautions to be maintained throughout the outpatient stay will include: orient to surroundings, keep bed in low position, maintain call bell within reach at all times, provide assistance with transfer out of bed and ambulation.  

## 2015-08-29 NOTE — Patient Instructions (Signed)
Bring someone who can drive you after your next appointment.

## 2015-08-29 NOTE — Telephone Encounter (Signed)
Pt is aware that we have to get auth from her insurance company for Return in about 2 weeks (around 09/12/2015) for Right suprascapular nerve block i placed the order in Angie's box.Marland Kitchen.Marland [email or web address]

## 2015-09-01 NOTE — Progress Notes (Signed)
Subjective:    Patient ID: Ariel White, female    DOB: 1960-05-26, 55 y.o.   MRN: [id number]  HPI  This patient is a pleasant delightful 55- year old lady who presents with pain everywhere She indicates that the pain was greatest in her right shoulder and neck; in her thoracic and lumbar back area atient indicated that her pain develop in September 2015 following a fall She has been treated with physical the and by pain medication  Pain intensity rating Her subjective pain intensity rating is 70% Her pain is relieved by baths in a hot tub and by vibration therapy Pain is aggravated by prolonged sitting and prolonged standing  Pain medications Vision takes tramadol one tablet eve and she also takes ibuprofen and Tylenol  Other medications Her medications include Macrobid  and Protonix  Allergies Patient is allergic to Compazine , Imitrex  IVP dye and possibly baclofen  Past medical history Past medical history is positive for c urinary tract infection depression GERD and an occipital brain tumor  Past surgical history Past surgical history ispositive for brain tumor surgery appendectomy and tubal ligation and laser eye surgery and anterior spine surgery  Social and economic history Patient indicates that she has she  Currently smokes 3 cigarettes a day but she has been smoking much more and  For about 40 years She uses alcohol occasionally Has never used illicit drugs  Family history Patient is currently divorced and has one son age 72 years Mother is deceased at age 62 from breast cancer Her father is deeased at age 48 from COPD and thyroid cancer She has one brother who is alive at  Age 46 and who has hypertension and diabetes and a right hip fracture She has one sister who is alive at age 45 years and who has diabetes and hypertension and  cardia problems  Review of Systems  Constitutional: Negative.  Negative for fever, chills, diaphoresis, activity change, appetite  change, fatigue and unexpected weight change.  HENT: Negative.  Negative for congestion, dental problem, drooling, ear discharge, ear pain, facial swelling, hearing loss, mouth sores, nosebleeds, postnasal drip, rhinorrhea, sinus pressure, sneezing, sore throat, tinnitus, trouble swallowing and voice change.   Eyes: Negative.  Negative for photophobia, pain, discharge, redness, itching and visual disturbance.  Respiratory: Positive for cough and shortness of breath. Negative for apnea, choking, chest tightness, wheezing and stridor.   Cardiovascular: Negative for chest pain, palpitations and leg swelling.  Gastrointestinal: Negative for nausea, vomiting, abdominal pain, diarrhea, constipation, blood in stool, abdominal distention, anal bleeding and rectal pain.  Endocrine: Negative.  Negative for cold intolerance, heat intolerance, polydipsia, polyphagia and polyuria.  Genitourinary: Negative.  Negative for dysuria, urgency, frequency, hematuria, flank pain, decreased urine volume, enuresis, difficulty urinating, genital sores, menstrual problem, pelvic pain and dyspareunia.  Musculoskeletal: Positive for myalgias, back pain, joint swelling, arthralgias, gait problem, neck pain and neck stiffness.  Skin: Negative.  Negative for color change, pallor, rash and wound.  Allergic/Immunologic: Negative for environmental allergies, food allergies and immunocompromised state.  Neurological: Negative.  Negative for dizziness, tremors, seizures, syncope, facial asymmetry, speech difficulty, weakness, light-headedness, numbness and headaches.  Hematological: Negative.  Negative for adenopathy. Does not bruise/bleed easily.  Psychiatric/Behavioral: Negative for suicidal ideas, hallucinations, behavioral problems, confusion, sleep disturbance, self-injury, dysphoric mood, decreased concentration and agitation. The patient is not nervous/anxious and is not hyperactive.        Objective:   Physical Exam   Constitutional: She is oriented to  person, place, and time. She appears well-nourished. No distress.  HENT:  Head: Normocephalic and atraumatic.  Right Ear: External ear normal.  Left Ear: External ear normal.  Nose: Nose normal.  Mouth/Throat: Oropharynx is clear and moist. No oropharyngeal exudate.  Eyes: Conjunctivae and EOM are normal. Pupils are equal, round, and reactive to light. Right eye exhibits no discharge. Left eye exhibits no discharge. No scleral icterus.  Neck: No JVD present. No tracheal deviation present. No thyromegaly present.  There was tenderness in the cervical spinewith some limitation in flexion of the neck Was also decreased in the range of movement  Cardiovascular: Normal rate, regular rhythm, normal heart sounds and intact distal pulses.  Exam reveals no gallop and no friction rub.   No murmur heard. The patient appeared to be in no distress Her pressure was 161/96 mmHg Pulse was 92 bpm : Equal and Regular Temperature was 98.24F Weight was 109 pounds Respirations was 16 breaths per minu SPO2 was 99%  Pulmonary/Chest: Effort normal. No stridor. No respiratory distress. She has wheezes. She has no rales. She exhibits no tenderness.  Abdominal: Soft. Bowel sounds are normal. She exhibits no distension and no mass. There is no tenderness. There is no rebound and no guarding.  Genitourinary:  Genitourinary examination was deferred  Musculoskeletal: She exhibits tenderness. She exhibits no edema.  There was tenderness in the left shoulder and abductio nand flexion were moderately impaired There was tenderness of the posterior cervical spine and there was some crepitus over the right shoulder  Lymphadenopathy:    She has no cervical adenopathy.  Neurological: She is alert and oriented to person, place, and time. She has normal reflexes. No cranial nerve deficit. She exhibits normal muscle tone. Coordination normal.  Skin: She is not diaphoretic.  Nursing note  and vitals reviewed.         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  Intravenous lidocaine infusion thera 3 constant a right suprascapular nerve We'll follow up with the patient in the next 2-3 weeks    New  patient      Level 4    Raykwon Hobbs M.D.

## 2015-09-05 ENCOUNTER — Telehealth: Payer: Self-pay | Admitting: *Deleted

## 2015-09-05 NOTE — Telephone Encounter (Signed)
Pt lm on vm to make an appt. I sw pt made her aware that we have to get auth from her insurance before this appt can be made(Return in about 2 weeks (around 09/12/2015) for Right suprascapular nerve block). I told her that I would ask Angie to check on it. Thanks

## 2015-09-12 ENCOUNTER — Encounter: Payer: Self-pay | Admitting: Anesthesiology

## 2015-09-12 ENCOUNTER — Ambulatory Visit: Payer: PPO | Attending: Anesthesiology | Admitting: Anesthesiology

## 2015-09-12 VITALS — BP 136/77 | HR 88 | Temp 98.5°F | Resp 16 | Ht 63.0 in | Wt 105.0 lb

## 2015-09-12 DIAGNOSIS — M7918 Myalgia, other site: Secondary | ICD-10-CM

## 2015-09-12 DIAGNOSIS — M797 Fibromyalgia: Secondary | ICD-10-CM

## 2015-09-12 MED ORDER — LIDOCAINE IN D5W 4-5 MG/ML-% IV SOLN
INTRAVENOUS | Status: AC
  Administered 2015-09-12: 15:00:00
  Filled 2015-09-12: qty 500

## 2015-09-12 MED ORDER — DIAZEPAM 5 MG PO TABS
ORAL_TABLET | ORAL | Status: AC
  Administered 2015-09-12: 5 mg via ORAL
  Filled 2015-09-12: qty 2

## 2015-09-12 MED ORDER — TRAMADOL HCL 50 MG PO TABS: 50.0000 mg | ORAL_TABLET | Freq: Two times a day (BID) | ORAL | Status: DC | PRN

## 2015-09-12 NOTE — Progress Notes (Signed)
Safety precautions to be maintained throughout the outpatient stay will include: orient to surroundings, keep bed in low position, maintain call bell within reach at all times, provide assistance with transfer out of bed and ambulation.  

## 2015-09-15 ENCOUNTER — Telehealth: Payer: Self-pay | Admitting: *Deleted

## 2015-09-15 NOTE — Telephone Encounter (Signed)
No problems post procedure. 

## 2015-09-16 NOTE — Progress Notes (Signed)
   Subjective:    Patient ID: Ariel White, female    DOB: 07/15/1960, 55 y.o.   MRN: [id number]  HPI    Review of Systems     Objective:   Physical Exam  Procedure Intravenous lidocaine infusion  Date of procedure 19th of May 2017  Surgeon  Tod PersiaWinston Frederich Montilla M.D.  Informed consent was obtained and the the patient appeared to to accept and understand the benefits  of this procedure  The patient  was taken to the procedure room and placed in the supine position The patient was weighed and her weight was 47 kg The dose of lidocaine to be administered was determined to be 4 mg/kg This turned out to be 188 mg of lidocaine Intravenous access was established and the customary monitors included  Automatic blood pressure electrocardiogram and pulse oximetry and respirations and pulse were monitored. Before commenced in the infusion, the patient was advised to inform the nursing staff in attendance of any symptoms of tinnitus circum-oral numbness and a metallic taste in the mouth as these were prodromal signs of an impending seizure The patient was given 10 mg of diazepam as a prophylaxis again seizures The infusion was then commenced and 188 mg of lidocaine were added to 250 mL of 5% dextrose water This infusion was carried out over the period of one hour At the end of 1 hour and the patient was doing fine and there were no side effects nor any sign of any seizure activity. He patient was then observed for an additional 30 minutes and at the end of that period, there were  adverse effects nor complications The intravenous access was removed and along with the monitors The patient was dwith her family and would follow up with her in 2 weeks She was also given tramadol 50 mg  Twice a day to take for pain # 30 tablets   Tod PersiaWinston Jaeley Wiker M.D.      Assessment & Plan:

## 2015-09-17 DIAGNOSIS — M797 Fibromyalgia: Secondary | ICD-10-CM | POA: Insufficient documentation

## 2015-09-19 ENCOUNTER — Ambulatory Visit: Payer: PPO | Admitting: Anesthesiology

## 2015-10-17 ENCOUNTER — Encounter: Payer: Self-pay | Admitting: Anesthesiology

## 2015-10-17 ENCOUNTER — Ambulatory Visit: Payer: PPO | Attending: Anesthesiology | Admitting: Anesthesiology

## 2015-10-17 VITALS — BP 137/77 | HR 92 | Temp 98.8°F | Resp 20 | Ht 63.0 in | Wt 110.0 lb

## 2015-10-17 DIAGNOSIS — M5136 Other intervertebral disc degeneration, lumbar region: Secondary | ICD-10-CM | POA: Diagnosis not present

## 2015-10-17 DIAGNOSIS — M7918 Myalgia, other site: Secondary | ICD-10-CM

## 2015-10-17 DIAGNOSIS — M797 Fibromyalgia: Secondary | ICD-10-CM | POA: Diagnosis not present

## 2015-10-17 DIAGNOSIS — M5030 Other cervical disc degeneration, unspecified cervical region: Secondary | ICD-10-CM

## 2015-10-17 MED ORDER — DIAZEPAM 5 MG PO TABS
ORAL_TABLET | ORAL | Status: AC
  Administered 2015-10-17: 10 mg via ORAL
  Filled 2015-10-17: qty 2

## 2015-10-17 NOTE — Progress Notes (Signed)
Safety precautions to be maintained throughout the outpatient stay will include: orient to surroundings, keep bed in low position, maintain call bell within reach at all times, provide assistance with transfer out of bed and ambulation.  

## 2015-10-17 NOTE — Progress Notes (Signed)
   Subjective:    Patient ID: Ariel RoysEdna M Santizo, female    DOB: 12/29/1960, 55 y.o.   MRN: [id number]  HPI  This patient returned to the clinic today indicating that she obtained a normal state relief from her last intravenous lidocaine infusion She also reported that she has stopped smoking and is feeling much better about herself She has indicated that there appears to be some modest recurrence of the pain and as a consequence she would like an additional intravenous lidocaine infusion therapy I agreed with her requests and will proceed to institute the interview intravenous lidocaine infusion today Her subjective pain intensity rating is 50%  Review of Systems     Objective:   Physical Exam        Assessment & Plan:    Date of procedure 10/17/2015  Procedure Intravenous lidocaine infusion  Preoperative diagnosis 1 fibromyalgia 2 myofascial pain syndrome 3 lumbar degenerative disc disease  Postoperative diagnosis Same  Surgeon Tod PersiaWinston Sanskriti Greenlaw M.D.  Informed consent was obtained and the patient appeared to accept and understand the benefits and risks of this procedure Patient was taken to the procedure room and placed in the supine position Intravenous access was established Patient was weighed and her weight turned out to be 110 pounds or 50 kg The  dose of lidocaine in the administered is 4 mg/kg into 150 cc of 5% dextrose water The customary monitors were applied and these include continuous blood pressure measurement pulse oximetry and electrocardiogram pulse and respiratory monitoring The patient was advised to inform the nursing staff of any evidence of tinnitus circumoral numbness or metallic taste in the mouth; these were prodromal signs of impending seizure and if they were to occur the infusion would be stopped immediately and other interventional measures taken The patient was given diazepam 10 mg orally as a prophylaxis against seizures The infusion was carried  out for one hour and during that time the patient experienced no seizure activities or any problems of the prodromal signs There were no adverse effects no complications At the end of the infusion the patient was observed for another 30 minutes and she did well and had no adverse effects or complications She was discharged home with her family We'll follow-up with her in 1 month    Established patient      level 4   Tod PersiaWinston Mishti Swanton M.D.

## 2015-10-20 ENCOUNTER — Telehealth: Payer: Self-pay

## 2015-10-20 NOTE — Telephone Encounter (Signed)
Post procedure phone call.  Left message.  

## 2015-10-22 ENCOUNTER — Telehealth: Payer: Self-pay | Admitting: Anesthesiology

## 2015-10-22 NOTE — Telephone Encounter (Signed)
Spoke with patient to let her know that Dr Starling MannsParris will be in the office on Friday and we can address Rx for her at that time.  Patient verbalizes u/o information.

## 2015-10-22 NOTE — Telephone Encounter (Signed)
Spoke with patient will f/up on Friday.

## 2015-10-22 NOTE — Telephone Encounter (Signed)
Had infusion last visit and was supposed to get med refill, did not get refill on tramadol, can she pick up script Friday

## 2015-10-24 ENCOUNTER — Other Ambulatory Visit: Payer: Self-pay | Admitting: *Deleted

## 2015-10-24 MED ORDER — TRAMADOL HCL 50 MG PO TABS: 50.0000 mg | ORAL_TABLET | Freq: Two times a day (BID) | ORAL | Status: DC | PRN

## 2015-10-24 NOTE — Telephone Encounter (Signed)
Called patient to let her know that Tramadol 50 mg 1 po q 12 hours qty 28 [facility] [address]

## 2015-11-04 NOTE — Telephone Encounter (Signed)
Dr Starling Mannsparris not working in clinic at this time

## 2015-11-05 ENCOUNTER — Ambulatory Visit: Admission: RE | Admit: 2015-11-05 | Payer: PPO | Source: Ambulatory Visit | Admitting: Unknown Physician Specialty

## 2015-11-05 ENCOUNTER — Encounter: Admission: RE | Payer: Self-pay | Source: Ambulatory Visit

## 2015-11-05 SURGERY — COLONOSCOPY WITH PROPOFOL
Anesthesia: General

## 2015-11-14 ENCOUNTER — Ambulatory Visit: Payer: PPO | Admitting: Anesthesiology

## 2015-12-23 ENCOUNTER — Encounter: Payer: Self-pay | Admitting: *Deleted

## 2015-12-24 ENCOUNTER — Ambulatory Visit
Admission: RE | Admit: 2015-12-24 | Discharge: 2015-12-24 | Disposition: A | Discharge status: Home Health | Payer: PPO | Source: Ambulatory Visit | Attending: Unknown Physician Specialty | Admitting: Unknown Physician Specialty

## 2015-12-24 ENCOUNTER — Encounter: Payer: Self-pay | Admitting: Anesthesiology

## 2015-12-24 ENCOUNTER — Encounter: Payer: Self-pay | Admitting: *Deleted

## 2015-12-24 ENCOUNTER — Encounter
Admission: RE | Disposition: A | Discharge status: Home Health | Payer: Self-pay | Source: Ambulatory Visit | Attending: Unknown Physician Specialty

## 2015-12-24 DIAGNOSIS — R1310 Dysphagia, unspecified: Secondary | ICD-10-CM | POA: Diagnosis present

## 2015-12-24 DIAGNOSIS — Z538 Procedure and treatment not carried out for other reasons: Secondary | ICD-10-CM | POA: Insufficient documentation

## 2015-12-24 DIAGNOSIS — K219 Gastro-esophageal reflux disease without esophagitis: Secondary | ICD-10-CM | POA: Diagnosis not present

## 2015-12-24 HISTORY — DX: Headache: R51

## 2015-12-24 HISTORY — DX: Dermatitis, unspecified: L30.9

## 2015-12-24 HISTORY — DX: Headache, unspecified: R51.9

## 2015-12-24 HISTORY — DX: Unspecified convulsions: R56.9

## 2015-12-24 HISTORY — DX: Gastro-esophageal reflux disease without esophagitis: K21.9

## 2015-12-24 SURGERY — COLONOSCOPY WITH PROPOFOL
Anesthesia: General

## 2015-12-24 MED ORDER — SODIUM CHLORIDE 0.9 % IV SOLN: INTRAVENOUS | Status: DC

## 2015-12-24 NOTE — OR Nursing (Signed)
Patient refused drug urine test.  Procedure cancelled per Dr. Henrene HawkingKephart.

## 2015-12-30 ENCOUNTER — Emergency Department
Admission: EM | Admit: 2015-12-30 | Discharge: 2015-12-30 | Disposition: A | Discharge status: Home / Self Care | Payer: PPO | Attending: Emergency Medicine | Admitting: Emergency Medicine

## 2015-12-30 ENCOUNTER — Encounter: Payer: Self-pay | Admitting: Medical Oncology

## 2015-12-30 DIAGNOSIS — Y929 Unspecified place or not applicable: Secondary | ICD-10-CM | POA: Diagnosis not present

## 2015-12-30 DIAGNOSIS — X501XXA Overexertion from prolonged static or awkward postures, initial encounter: Secondary | ICD-10-CM | POA: Diagnosis not present

## 2015-12-30 DIAGNOSIS — S39012A Strain of muscle, fascia and tendon of lower back, initial encounter: Secondary | ICD-10-CM | POA: Diagnosis not present

## 2015-12-30 DIAGNOSIS — M545 Low back pain: Secondary | ICD-10-CM | POA: Diagnosis present

## 2015-12-30 DIAGNOSIS — Y9389 Activity, other specified: Secondary | ICD-10-CM | POA: Insufficient documentation

## 2015-12-30 DIAGNOSIS — Y999 Unspecified external cause status: Secondary | ICD-10-CM | POA: Diagnosis not present

## 2015-12-30 DIAGNOSIS — F17210 Nicotine dependence, cigarettes, uncomplicated: Secondary | ICD-10-CM | POA: Diagnosis not present

## 2015-12-30 DIAGNOSIS — M62830 Muscle spasm of back: Secondary | ICD-10-CM

## 2015-12-30 MED ORDER — KETOROLAC TROMETHAMINE 30 MG/ML IJ SOLN
30.0000 mg | Freq: Once | INTRAMUSCULAR | Status: AC
  Administered 2015-12-30: 30 mg via INTRAMUSCULAR
  Filled 2015-12-30: qty 1

## 2015-12-30 MED ORDER — CYCLOBENZAPRINE HCL 10 MG PO TABS: 10.0000 mg | ORAL_TABLET | Freq: Three times a day (TID) | ORAL | 0 refills | Status: DC | PRN

## 2015-12-30 MED ORDER — NAPROXEN SODIUM 550 MG PO TABS: 550.0000 mg | ORAL_TABLET | Freq: Two times a day (BID) | ORAL | 0 refills | Status: AC

## 2015-12-30 NOTE — ED Notes (Signed)
States she turned slightly to answer her phone   Felt a pull in her left lower back  States pain is non radiating.

## 2015-12-30 NOTE — ED Triage Notes (Signed)
Pt reports rt lower back pain that began yesterday after a quick movement.

## 2015-12-30 NOTE — Discharge Instructions (Signed)
Please keep your appointment with your PCP for tomorrow to discuss chronic pain and to reassess current acute pain.   May continue Tylenol as needed.

## 2015-12-30 NOTE — ED Provider Notes (Signed)
[facility] Emergency Department Provider Note  ____________________________________________  Time seen: Approximately 7:20 AM  I have reviewed the triage vital signs and the nursing notes.   HISTORY  Chief Complaint Back Pain    HPI Ariel White is a 55 y.o. female , NAD, presents to the emergency department with one-day history of left lower back pain. Patient states she had onset of left lower back pain yesterday when she turned quickly to pick up the phone. Has had tightness in the left lower back since that time. Has been taking over-the-counter Tylenol and ibuprofen without alleviation. Denies any radiation of pain, numbness, weakness, tingling. Has had no saddle paresthesias nor loss of bowel or bladder control. Denies dysuria, hematuria or change in urinary frequency. Has had no abdominal pain, nausea, vomiting, diarrhea. No fevers, chills, body aches. Has been able to ambulate per her usual. Patient does have chronic pain as well as fibromyalgia but is not currently in pain management. Patient states that her pain management physician retired and she has an appointment tomorrow with her primary care provider to discuss further pain management options.   Past Medical History:  Diagnosis Date  . Anxiety   . Bipolar 1 disorder (HCC)   . Chronic pain   . Depression   . Eczema   . GERD (gastroesophageal reflux disease)   . Headache    migraines  . Seizures [facility](HCC)     Patient Active Problem List   Diagnosis Date Noted  . Fibromyalgia 09/17/2015  . Myofascial pain syndrome 08/29/2015  . DDD (degenerative disc disease), cervical 08/29/2015  . Pain in joint, shoulder region 08/29/2015  . Chronic thoracic back pain 08/29/2015  . Depression, unspecified 02/01/2015  . Alcohol use disorder, mild, abuse 02/01/2015  . 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  ABDOMINAL HYSTERECTOMY    . APPENDECTOMY    . BRAIN TUMOR EXCISION  1985  . TUBAL LIGATION      Prior to Admission medications   Medication Sig Start Date End Date Taking? Authorizing Provider  acetaminophen (TYLENOL ARTHRITIS PAIN) 650 MG CR tablet Take 650 mg by mouth every 8 (eight) hours as needed for pain.    Historical Provider, MD  cyclobenzaprine (FLEXERIL) 10 MG tablet Take 1 tablet (10 mg total) by mouth 3 (three) times daily as needed for muscle spasms. 12/30/15   Madisynn Plair L Dayvin Aber, PA-C  loperamide (IMODIUM A-D) 2 MG tablet Take 1 tablet (2 mg total) by mouth 4 (four) times daily as needed for diarrhea or loose stools. Patient not taking: Reported on 08/29/2015 08/04/15   Rockne MenghiniAnne-Caroline Norman, MD  naproxen sodium (ANAPROX) 550 MG tablet Take 1 tablet (550 mg total) by mouth 2 (two) times daily with a meal. 12/30/15 01/06/16  Trevontae Lindahl L Miamarie Moll, PA-C  ondansetron (ZOFRAN ODT) 4 MG disintegrating tablet Take 1 tablet (4 mg total) by mouth every 8 (eight) hours as needed for nausea or vomiting. Patient not taking: Reported on 08/29/2015 08/04/15   Rockne MenghiniAnne-Caroline Norman, MD  pantoprazole (PROTONIX) 40 MG tablet Take 40 mg by mouth daily.    Historical Provider, MD    Allergies Compazine [prochlorperazine edisylate]; Ivp dye [iodinated diagnostic agents]; Baclofen; Imitrex [sumatriptan]; and Voltaren [diclofenac]  Family History  Problem Relation Age of Onset  . Breast cancer Mother     7270's    Social History Social History  Substance Use Topics  . Smoking status: Current  Every Day Smoker    Packs/day: 0.25    Types: Cigarettes    Last attempt to quit: 10/10/2015  . Smokeless tobacco: Never Used  . Alcohol use No     Review of Systems  Constitutional: No fever/chills Cardiovascular: No chest pain. Respiratory: No shortness of breath.  Gastrointestinal: No abdominal pain.  No nausea, vomiting.  Genitourinary: Negative for dysuria, hematuria. No urinary hesitancy, urgency or increased  frequency. Musculoskeletal: Positive for back pain.  Skin: Negative for rash, redness, swelling, bruising, skin sores. Neurological: Negative for headaches, focal weakness or numbness. No tingling, saddle paresthesias, loss of bowel or bladder control. 10-point ROS otherwise negative.  ____________________________________________   PHYSICAL EXAM:  VITAL SIGNS: ED Triage Vitals [12/30/15 0716]  Enc Vitals Group     BP      Pulse      Resp      Temp      Temp src      SpO2      Weight 110 lb (49.9 kg)     Height 5\' 3"  (1.6 m)     Head Circumference      Peak Flow      Pain Score 10     Pain Loc      Pain Edu?      Excl. in GC?      Constitutional: Alert and oriented. Well appearing and in no acute distress. Eyes: Conjunctivae are normal without icterus or injection  Head: Atraumatic. Neck: Supple with full range of motion. No cervical spine tenderness to palpation. No trapezial muscle spasms. Hematological/Lymphatic/Immunilogical: No cervical lymphadenopathy. Cardiovascular: Normal rate, regular rhythm. Normal S1 and S2.  Good peripheral circulation with 2+ pulses noted in bilateral lower extremities. Respiratory: Normal respiratory effort without tachypnea or retractions. Lungs CTAB with breath sounds noted in all lung fields. Musculoskeletal: Tenderness and muscle spasm noted to the left lower back about the mid to lower lumbar region. No central lumbar tenderness to palpation. No thoracic central spinal no paraspinal tenderness to palpation. No SI joint tenderness. Negative bilateral straight leg raise. No lower extremity tenderness nor edema.  No joint effusions. Neurologic:  Normal speech and language. No gross focal neurologic deficits are appreciated. Gait and posture normal Skin:  Skin is warm, dry and intact. No rash, bruising, redness, swelling noted. Psychiatric: Mood and affect are normal. Speech and behavior are normal. Patient exhibits appropriate insight and  judgement.   ____________________________________________   LABS  None ____________________________________________  EKG  None ____________________________________________  RADIOLOGY  None ____________________________________________    PROCEDURES  Procedure(s) performed: None   Procedures   Medications  ketorolac (TORADOL) 30 MG/ML injection 30 mg (30 mg Intramuscular Given 12/30/15 0807)     ____________________________________________   INITIAL IMPRESSION / ASSESSMENT AND PLAN / ED COURSE  Pertinent labs & imaging results that were available during my care of the patient were reviewed by me and considered in my medical decision making (see chart for details).  Clinical Course    Patient's diagnosis is consistent with lumbar strain with muscle spasm of the back. Patient will be discharged home with prescriptions for Flexeril and Anaprox to take as directed. Patient is to follow-up with her primary care provider tomorrow as currently scheduled for further pain management options as well as follow-up on current pain. Patient is given ED precautions to return to the ED for any worsening or new symptoms.      ____________________________________________  FINAL CLINICAL IMPRESSION(S) / ED DIAGNOSES  Final diagnoses:  Lumbar strain, initial encounter  Muscle spasm of back      NEW MEDICATIONS STARTED DURING THIS VISIT:  Discharge Medication List as of 12/30/2015  8:21 AM    START taking these medications   Details  cyclobenzaprine (FLEXERIL) 10 MG tablet Take 1 tablet (10 mg total) by mouth 3 (three) times daily as needed for muscle spasms., Starting Tue 12/30/2015, Print    naproxen sodium (ANAPROX) 550 MG tablet Take 1 tablet (550 mg total) by mouth 2 (two) times daily with a meal., Starting Tue 12/30/2015, Until Tue 01/06/2016, Print             Ernestene Kiel Novi, PA-C 12/30/15 0960    Emily Filbert, MD 12/30/15 1306

## 2016-10-05 IMAGING — CT CT HEAD W/O CM
1 of 2 series · 16 of 30 positions shown, 20 images · non-contrast
Comparison: Head CT 11/01/2014

CLINICAL DATA: Dizziness.

EXAM:
CT HEAD WITHOUT CONTRAST
TECHNIQUE: Contiguous axial images were obtained from the base of the skull
through the vertex without intravenous contrast.

[Series 2: head wo · axial · 0.45mm/px · z∈[-182,-56]mm · 16 of 32 slices shown, 20 images]
[im 2/32  brain]
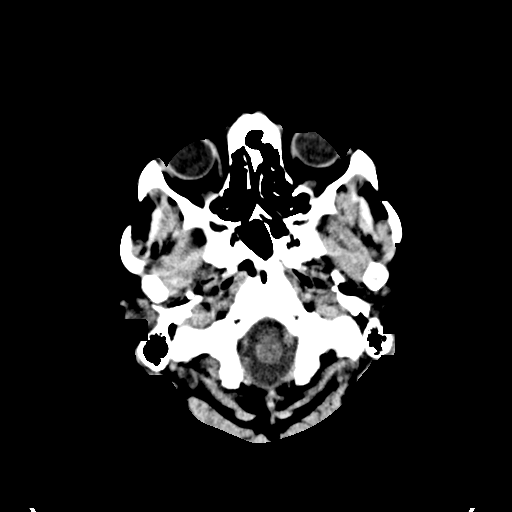
[im 2/32  bone]
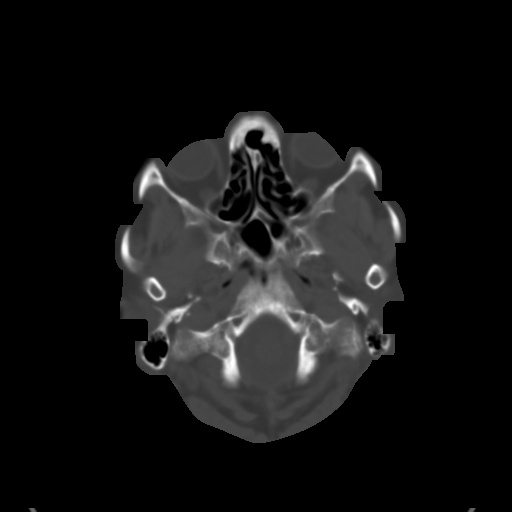
[im 4/32  brain]
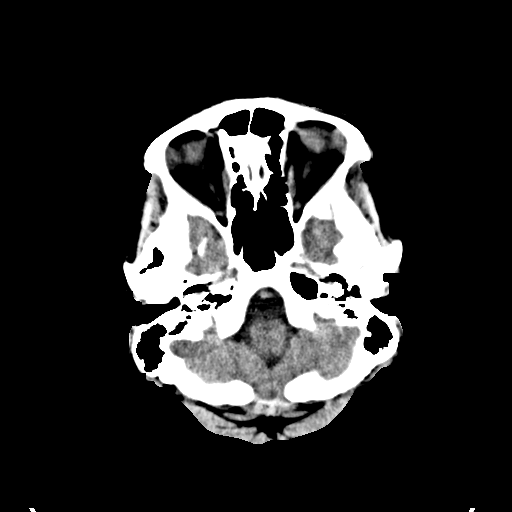
[im 5/32  brain]
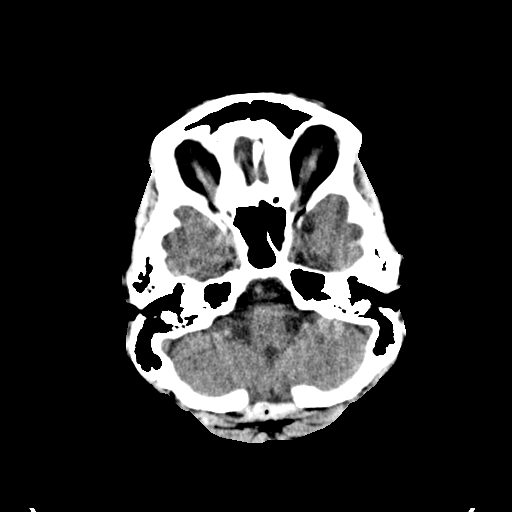
[im 8/32  brain]
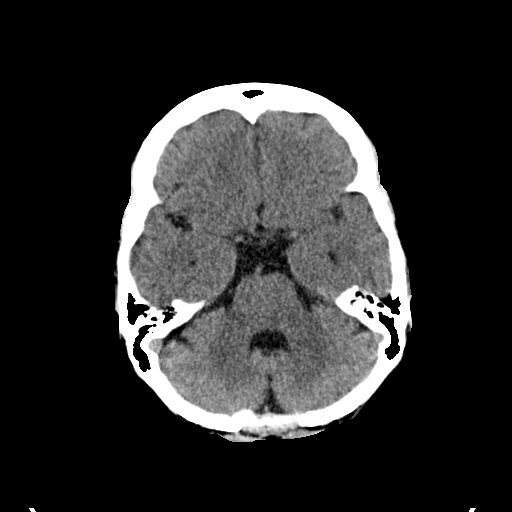
[im 10/32  brain]
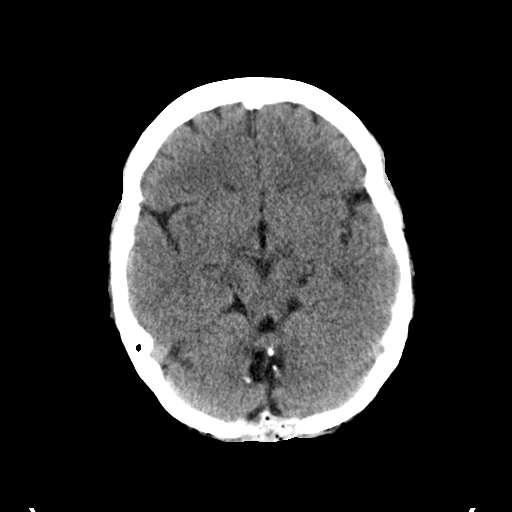
[im 10/32  bone]
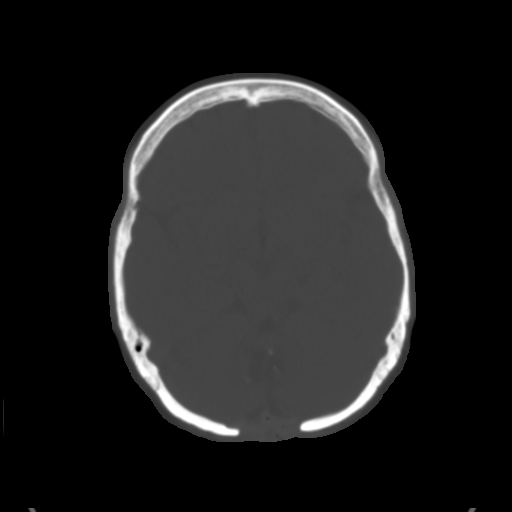
[im 11/32  brain]
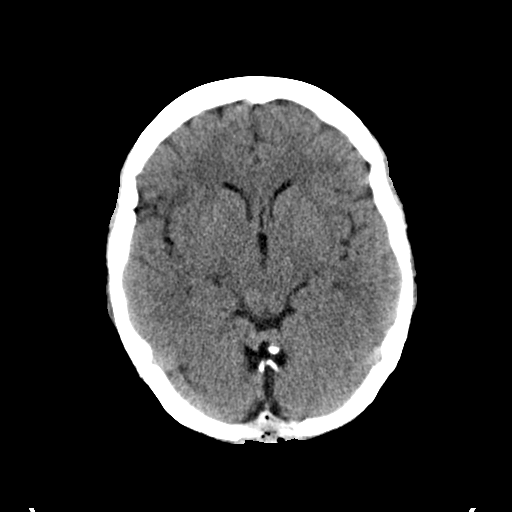
[im 14/32  brain]
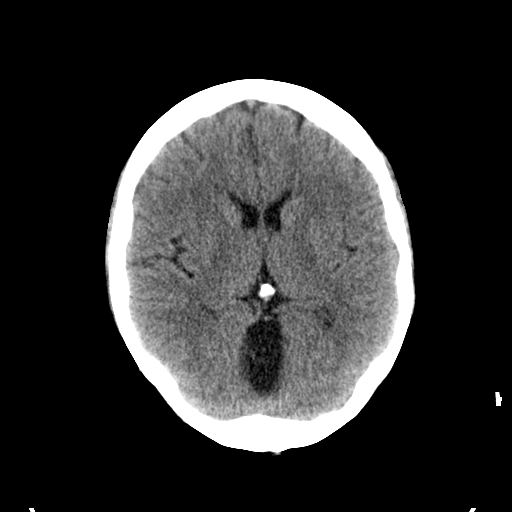
[im 15/32  brain]
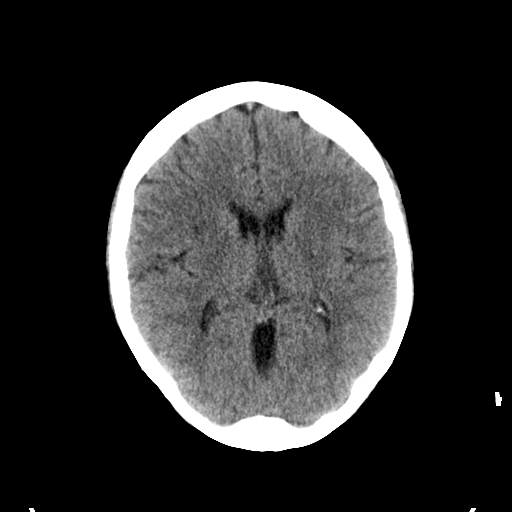
[im 17/32  brain]
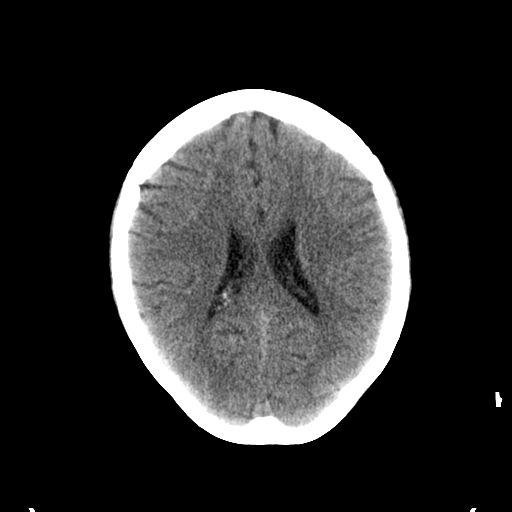
[im 17/32  bone]
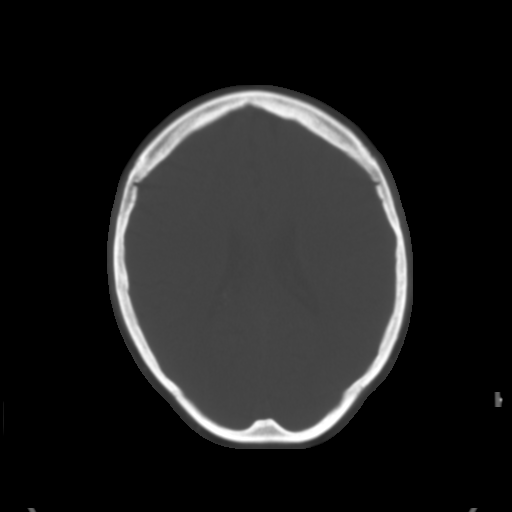
[im 18/32  brain]
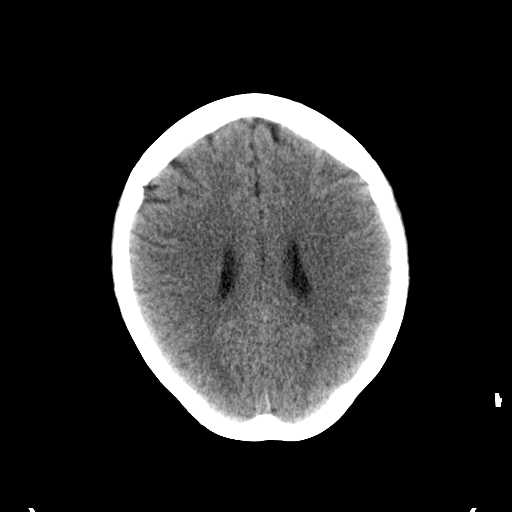
[im 21/32  brain]
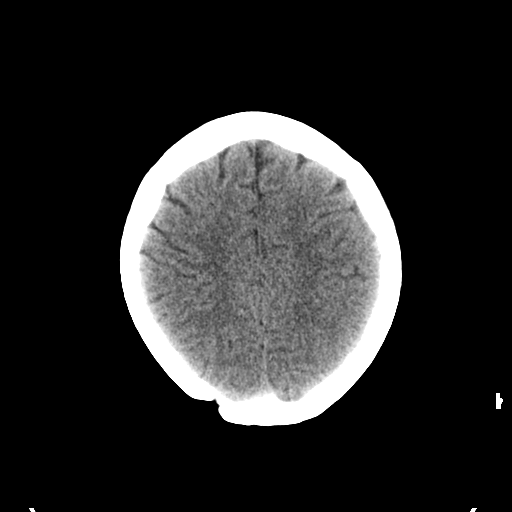
[im 22/32  brain]
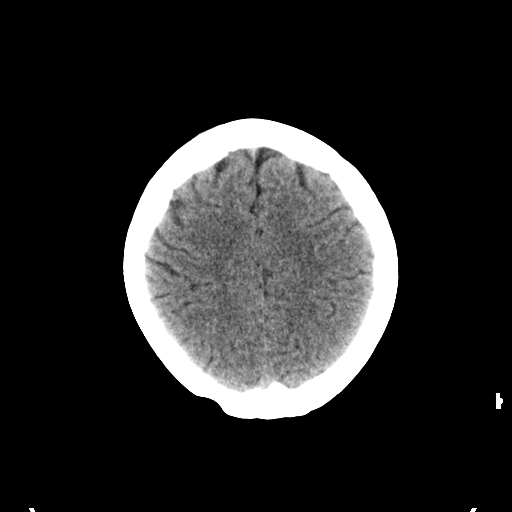
[im 24/32  brain]
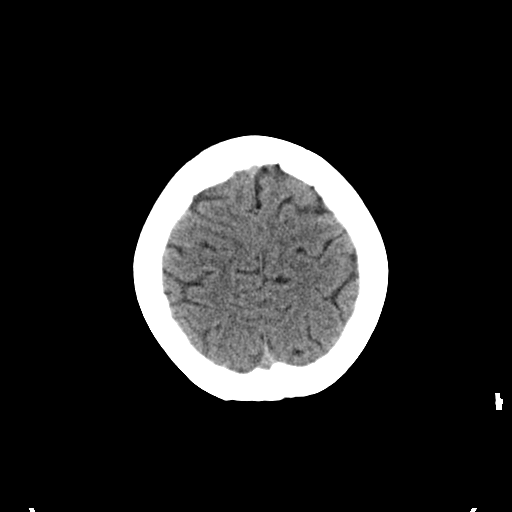
[im 24/32  bone]
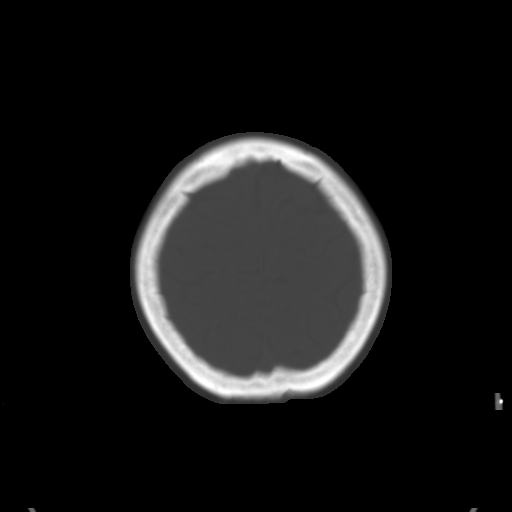
[im 27/32  brain]
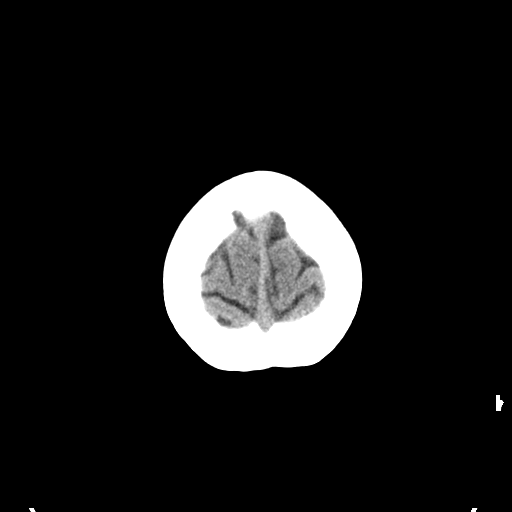
[im 28/32  brain]
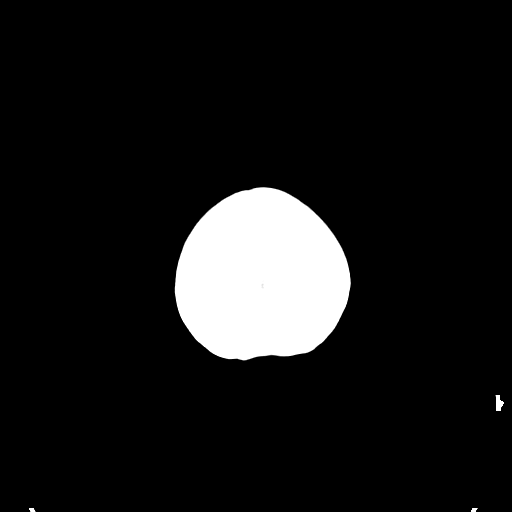
[im 30/32  brain]
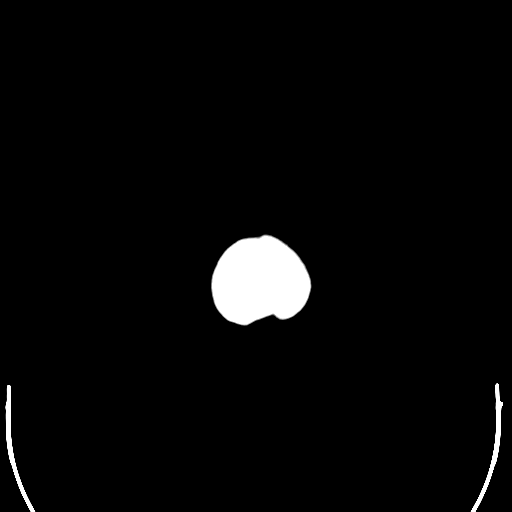

[16 of 30 positions shown; findings below may reference images not displayed]

FINDINGS: Brain: No evidence of acute infarction, hemorrhage, extra-axial
collection, ventriculomegaly, or mass effect.

Vascular: No hyperdense vessel or unexpected calcification.

Skull: Post occipital craniotomy and reconstruction, unchanged in
appearance. Right parietal burr hole. Negative for fracture or focal
lesion.

Sinuses/Orbits: No acute findings.

Other: None.
IMPRESSION: 1.  No acute intracranial abnormality.
2. Post occipital craniotomy and right parietal burr hole,
unchanged.

## 2017-05-10 ENCOUNTER — Other Ambulatory Visit: Payer: Self-pay | Admitting: Student

## 2017-05-10 DIAGNOSIS — Z1231 Encounter for screening mammogram for malignant neoplasm of breast: Secondary | ICD-10-CM

## 2017-05-25 ENCOUNTER — Ambulatory Visit
Admission: RE | Admit: 2017-05-25 | Discharge: 2017-05-25 | Disposition: A | Discharge status: Home / Self Care | Payer: Medicare HMO | Source: Ambulatory Visit | Attending: Student | Admitting: Student

## 2017-05-25 DIAGNOSIS — Z1231 Encounter for screening mammogram for malignant neoplasm of breast: Secondary | ICD-10-CM | POA: Insufficient documentation

## 2018-09-05 ENCOUNTER — Other Ambulatory Visit: Payer: Self-pay | Admitting: Student

## 2018-09-05 DIAGNOSIS — M7521 Bicipital tendinitis, right shoulder: Secondary | ICD-10-CM

## 2018-09-05 DIAGNOSIS — M7581 Other shoulder lesions, right shoulder: Secondary | ICD-10-CM

## 2018-09-12 ENCOUNTER — Other Ambulatory Visit: Payer: Self-pay

## 2018-09-12 ENCOUNTER — Ambulatory Visit
Admission: RE | Admit: 2018-09-12 | Discharge: 2018-09-12 | Disposition: A | Discharge status: Home / Self Care | Payer: Medicare HMO | Source: Ambulatory Visit | Attending: Student | Admitting: Student

## 2018-09-12 DIAGNOSIS — M7521 Bicipital tendinitis, right shoulder: Secondary | ICD-10-CM | POA: Insufficient documentation

## 2018-09-12 DIAGNOSIS — M7581 Other shoulder lesions, right shoulder: Secondary | ICD-10-CM | POA: Insufficient documentation

## 2018-09-14 ENCOUNTER — Ambulatory Visit (INDEPENDENT_AMBULATORY_CARE_PROVIDER_SITE_OTHER): Payer: Medicare HMO | Admitting: Licensed Clinical Social Worker

## 2018-09-14 ENCOUNTER — Other Ambulatory Visit: Payer: Self-pay

## 2018-09-14 DIAGNOSIS — F3162 Bipolar disorder, current episode mixed, moderate: Secondary | ICD-10-CM

## 2018-09-14 DIAGNOSIS — F4310 Post-traumatic stress disorder, unspecified: Secondary | ICD-10-CM
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
## 2018-09-15 ENCOUNTER — Other Ambulatory Visit: Payer: Self-pay | Admitting: Student

## 2018-09-15 DIAGNOSIS — N644 Mastodynia: Secondary | ICD-10-CM

## 2018-10-10 ENCOUNTER — Encounter: Payer: Self-pay | Admitting: Licensed Clinical Social Worker

## 2018-10-10 ENCOUNTER — Ambulatory Visit (INDEPENDENT_AMBULATORY_CARE_PROVIDER_SITE_OTHER): Payer: Medicare HMO | Admitting: Licensed Clinical Social Worker

## 2018-10-10 ENCOUNTER — Other Ambulatory Visit: Payer: Self-pay

## 2018-10-10 DIAGNOSIS — F3162 Bipolar disorder, current episode mixed, moderate: Secondary | ICD-10-CM | POA: Diagnosis not present

## 2018-10-10 DIAGNOSIS — F4310 Post-traumatic stress disorder, unspecified: Secondary | ICD-10-CM | POA: Diagnosis not present
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
## 2018-10-23 ENCOUNTER — Ambulatory Visit: Payer: Medicare HMO | Admitting: Psychiatry

## 2018-10-23 ENCOUNTER — Other Ambulatory Visit: Payer: Self-pay

## 2018-10-23 NOTE — Progress Notes (Signed)
Patient reported she did not want to keep the appointment due to concerns about copay and hung up.

## 2018-10-31 ENCOUNTER — Ambulatory Visit (INDEPENDENT_AMBULATORY_CARE_PROVIDER_SITE_OTHER): Payer: Medicare HMO | Admitting: Licensed Clinical Social Worker

## 2018-10-31 ENCOUNTER — Other Ambulatory Visit: Payer: Self-pay

## 2018-10-31 ENCOUNTER — Encounter: Payer: Self-pay | Admitting: Licensed Clinical Social Worker

## 2018-10-31 DIAGNOSIS — F3162 Bipolar disorder, current episode mixed, moderate: Secondary | ICD-10-CM | POA: Diagnosis not present

## 2018-10-31 NOTE — Progress Notes (Signed)
Virtual Visit via Telephone Note  I connected with Ariel White on 10/31/18 at  2:30 PM EDT by telephone and verified that I am speaking with the correct person using two identifiers.   I discussed the limitations, risks, security and privacy concerns of performing an evaluation and management service by telephone and the availability of in person appointments. I also discussed with the patient that there may be a patient responsible charge related to this service. The patient expressed understanding and agreed to proceed.    I discussed the assessment and treatment plan with the patient. The patient was provided an opportunity to ask questions and all were answered. The patient agreed with the plan and demonstrated an understanding of the instructions.   The patient was advised to call back or seek an in-person evaluation if the symptoms worsen or if the condition fails to improve as anticipated.  I provided 10 minutes of non-face-to-face time during this encounter.   Ariel Hipp, LCSW    THERAPIST PROGRESS NOTE  Session Time: 1430  Participation Level: Minimal  Behavioral Response: NAAlertIrritable  Type of Therapy: Individual Therapy  Treatment Goals addressed: Coping  Interventions: Supportive  Summary: Ariel White is a 58 y.o. female who presents with continued symptoms of her diagnosis. Ariel White reported she had forgotten about our appointment today but was in agreement with continuing with the session. She went on to discuss how she had cancelled her appointment with the psychiatrist, "she called me and I told her I didn't have 40 dollars to spend every time one of you guys picks up the phone." LCSW asked if that copay applied to therapy sessions as well. Ariel White confirmed. Because of the discussion around her cancelling the appointment with MD due to copay, LCSW offered Ariel White the option of canceling today's session. She reported, "You're really quick to say we can cancel it." LCSW  reiiterated the reason for this offer was completely to be helpful. Ariel White reported, "I'm done with you guys. You don't to help if I can't pay. Have a nice life." Ariel White then hung the phone up.  Suicidal/Homicidal: No  Therapist Response: Ariel White was not willing to discuss options with LCSW to continue therapy sessions and was unwilling to participate in therapy after LCSW offered the option of rescheduling appointment. We will continue therapy if pt is willing.   Plan: Return again if willing to continue therapy sessions.   Diagnosis: Axis I: Bipolar Disorder    Axis II: No diagnosis    Ariel Hipp, LCSW 10/31/2018

## 2018-11-07 ENCOUNTER — Encounter
Admission: RE | Admit: 2018-11-07 | Discharge: 2018-11-07 | Disposition: A | Discharge status: Home / Self Care | Payer: Medicare HMO | Source: Ambulatory Visit | Attending: Surgery | Admitting: Surgery

## 2018-11-07 ENCOUNTER — Other Ambulatory Visit: Payer: Self-pay

## 2018-11-07 DIAGNOSIS — Z1159 Encounter for screening for other viral diseases: Secondary | ICD-10-CM | POA: Diagnosis not present

## 2018-11-07 DIAGNOSIS — Z01812 Encounter for preprocedural laboratory examination: Secondary | ICD-10-CM | POA: Insufficient documentation

## 2018-11-07 LAB — CBC
HCT: 41.5 % (ref 36.0–46.0)
Hemoglobin: 14.2 g/dL (ref 12.0–15.0)
MCH: 40.2 pg — ABNORMAL HIGH (ref 26.0–34.0)
MCHC: 34.2 g/dL (ref 30.0–36.0)
MCV: 117.6 fL — ABNORMAL HIGH (ref 80.0–100.0)
Platelets: 361 10*3/uL (ref 150–400)
RBC: 3.53 MIL/uL — ABNORMAL LOW (ref 3.87–5.11)
RDW: 14 % (ref 11.5–15.5)
WBC: 10.7 10*3/uL — ABNORMAL HIGH (ref 4.0–10.5)
nRBC: 0 % (ref 0.0–0.2)

## 2018-11-07 LAB — BASIC METABOLIC PANEL
Anion gap: 17 — ABNORMAL HIGH (ref 5–15)
BUN: 18 mg/dL (ref 6–20)
CO2: 18 mmol/L — ABNORMAL LOW (ref 22–32)
Calcium: 9.2 mg/dL (ref 8.9–10.3)
Chloride: 103 mmol/L (ref 98–111)
Creatinine, Ser: 0.76 mg/dL (ref 0.44–1.00)
GFR calc Af Amer: >60 mL/min (ref >60)
GFR calc non Af Amer: >60 mL/min (ref >60)
Glucose, Bld: 80 mg/dL (ref 70–99)
Potassium: 3.9 mmol/L (ref 3.5–5.1)
Sodium: 138 mmol/L (ref 135–145)

## 2018-11-07 LAB — URINALYSIS, ROUTINE W REFLEX MICROSCOPIC
Glucose, UA: NEGATIVE mg/dL
Hgb urine dipstick: NEGATIVE
Ketones, ur: 80 mg/dL — AB
Nitrite: POSITIVE — AB
Protein, ur: 30 mg/dL — AB
Specific Gravity, Urine: 1.025 (ref 1.005–1.030)
pH: 6 (ref 5.0–8.0)

## 2018-11-07 LAB — URINALYSIS, MICROSCOPIC (REFLEX): RBC / HPF: NONE SEEN RBC/hpf (ref 0–5)

## 2018-11-07 LAB — TYPE AND SCREEN
ABO/RH(D): O POS
Antibody Screen: NEGATIVE

## 2018-11-07 LAB — SURGICAL PCR SCREEN
MRSA, PCR: NEGATIVE
Staphylococcus aureus: NEGATIVE

## 2018-11-07 NOTE — Patient Instructions (Signed)
Your procedure is scheduled on: 11/14/18 Report to Day Surgery. [facility]OND FLOOR To find out your arrival time please call [phone] between 1PM - 3PM on 11/13/18.  Remember: Instructions that are not followed completely may result in serious medical risk,  up to and including death, or upon the discretion of your surgeon and anesthesiologist your  surgery may need to be rescheduled.     _X__ 1. Do not eat food after midnight the night before your procedure.                 No gum chewing or hard candies. You may drink clear liquids up to 2 hours                 before you are scheduled to arrive for your surgery- DO not drink clear                 liquids within 2 hours of the start of your surgery.                 Clear Liquids include:  water, apple juice without pulp, clear carbohydrate                 drink such as Clearfast of Gatorade, Black Coffee or Tea (Do not add                 anything to coffee or tea).  __X__2.  On the morning of surgery brush your teeth with toothpaste and water, you                may rinse your mouth with mouthwash if you wish.  Do not swallow any toothpaste of mouthwash.     _X__ 3.  No Alcohol for 24 hours before or after surgery.   _X__ 4.  Do Not Smoke or use e-cigarettes For 24 Hours Prior to Your Surgery.                 Do not use any chewable tobacco products for at least 6 hours prior to                 surgery.  ____  5.  Bring all medications with you on the day of surgery if instructed.   __X__  6.  Notify your doctor if there is any change in your medical condition      (cold, fever, infections).     Do not wear jewelry, make-up, hairpins, clips or nail polish. Do not wear lotions, powders, or perfumes. You may wear deodorant. Do not shave 48 hours prior to surgery. Men may shave face and neck. Do not bring valuables to the hospital.    [facility] is not responsible for any belongings or  valuables.  Contacts, dentures or bridgework may not be worn into surgery. Leave your suitcase in the car. After surgery it may be brought to your room. For patients admitted to the hospital, discharge time is determined by your treatment team.   Patients discharged the day of surgery will not be allowed to drive home.   Please read over the following fact sheets that you were given:   Surgical Site Infection Prevention          _X__ Take these medicines the morning of surgery with A SIP OF WATER:    1. PANTOPRAZOLE  2.   3.   4.  5.  6.  ____ Fleet Enema (as directed)  __X__ Use CHG Soap as directed  ____ Use inhalers on the day of surgery  ____ Stop metformin 2 days prior to surgery    ____ Take 1/2 of usual insulin dose the night before surgery. No insulin the morning          of surgery.   ____ Stop Coumadin/Plavix/aspirin on   ____ Stop Anti-inflammatories on    ____ Stop supplements until after surgery.    ____ Bring C-Pap to the hospital.

## 2018-11-09 LAB — URINE CULTURE: Culture: >=100000 — AB

## 2018-11-10 ENCOUNTER — Other Ambulatory Visit: Payer: Self-pay

## 2018-11-10 ENCOUNTER — Other Ambulatory Visit
Admission: RE | Admit: 2018-11-10 | Discharge: 2018-11-10 | Disposition: A | Discharge status: Home / Self Care | Payer: Medicare HMO | Source: Ambulatory Visit | Attending: Surgery | Admitting: Surgery

## 2018-11-10 DIAGNOSIS — Z01812 Encounter for preprocedural laboratory examination: Secondary | ICD-10-CM | POA: Diagnosis not present

## 2018-11-10 LAB — SARS CORONAVIRUS 2 (TAT 6-24 HRS): SARS Coronavirus 2: NEGATIVE

## 2018-11-13 MED ORDER — CEFAZOLIN SODIUM-DEXTROSE 2-4 GM/100ML-% IV SOLN
2.0000 g | Freq: Once | INTRAVENOUS | Status: AC
  Administered 2018-11-14: 2 g via INTRAVENOUS

## 2018-11-14 ENCOUNTER — Inpatient Hospital Stay
Admission: RE | Admit: 2018-11-14 | Discharge: 2018-11-15 | DRG: 483 | Disposition: A | Discharge status: Home Health | Payer: Medicare HMO | Attending: Surgery | Admitting: Surgery

## 2018-11-14 ENCOUNTER — Inpatient Hospital Stay: Payer: Medicare HMO | Admitting: Anesthesiology

## 2018-11-14 ENCOUNTER — Observation Stay: Payer: Medicare HMO

## 2018-11-14 ENCOUNTER — Other Ambulatory Visit: Payer: Self-pay

## 2018-11-14 ENCOUNTER — Encounter: Payer: Self-pay | Admitting: *Deleted

## 2018-11-14 ENCOUNTER — Encounter
Admission: RE | Disposition: A | Discharge status: Home Health | Payer: Self-pay | Source: Home / Self Care | Attending: Surgery

## 2018-11-14 DIAGNOSIS — Z91041 Radiographic dye allergy status: Secondary | ICD-10-CM

## 2018-11-14 DIAGNOSIS — M87029 Idiopathic aseptic necrosis of unspecified humerus: Secondary | ICD-10-CM | POA: Diagnosis present

## 2018-11-14 DIAGNOSIS — K219 Gastro-esophageal reflux disease without esophagitis: Secondary | ICD-10-CM | POA: Diagnosis present

## 2018-11-14 DIAGNOSIS — Z825 Family history of asthma and other chronic lower respiratory diseases: Secondary | ICD-10-CM

## 2018-11-14 DIAGNOSIS — Z820 Family history of epilepsy and other diseases of the nervous system: Secondary | ICD-10-CM

## 2018-11-14 DIAGNOSIS — Z79891 Long term (current) use of opiate analgesic: Secondary | ICD-10-CM

## 2018-11-14 DIAGNOSIS — M879 Osteonecrosis, unspecified: Principal | ICD-10-CM | POA: Diagnosis present

## 2018-11-14 DIAGNOSIS — Z800 Family history of malignant neoplasm of digestive organs: Secondary | ICD-10-CM

## 2018-11-14 DIAGNOSIS — Z79899 Other long term (current) drug therapy: Secondary | ICD-10-CM

## 2018-11-14 DIAGNOSIS — Z8249 Family history of ischemic heart disease and other diseases of the circulatory system: Secondary | ICD-10-CM

## 2018-11-14 DIAGNOSIS — Z833 Family history of diabetes mellitus: Secondary | ICD-10-CM

## 2018-11-14 DIAGNOSIS — G8929 Other chronic pain: Secondary | ICD-10-CM | POA: Diagnosis present

## 2018-11-14 DIAGNOSIS — Z888 Allergy status to other drugs, medicaments and biological substances status: Secondary | ICD-10-CM

## 2018-11-14 DIAGNOSIS — Z803 Family history of malignant neoplasm of breast: Secondary | ICD-10-CM

## 2018-11-14 HISTORY — PX: SHOULDER HEMI-ARTHROPLASTY: SHX5049

## 2018-11-14 LAB — URINE DRUG SCREEN, QUALITATIVE (ARMC ONLY)
Amphetamines, Ur Screen: NOT DETECTED
Barbiturates, Ur Screen: NOT DETECTED
Benzodiazepine, Ur Scrn: NOT DETECTED
Cannabinoid 50 Ng, Ur ~~LOC~~: NOT DETECTED
Cocaine Metabolite,Ur ~~LOC~~: NOT DETECTED
MDMA (Ecstasy)Ur Screen: NOT DETECTED
Methadone Scn, Ur: NOT DETECTED
Opiate, Ur Screen: POSITIVE — AB
Phencyclidine (PCP) Ur S: NOT DETECTED
Tricyclic, Ur Screen: POSITIVE — AB

## 2018-11-14 LAB — ABO/RH: ABO/RH(D): O POS

## 2018-11-14 SURGERY — HEMIARTHROPLASTY, SHOULDER
Anesthesia: General | Laterality: Right

## 2018-11-14 MED ORDER — MIDAZOLAM HCL 2 MG/2ML IJ SOLN
INTRAMUSCULAR | Status: DC | PRN
  Administered 2018-11-14: 2 mg via INTRAVENOUS

## 2018-11-14 MED ORDER — BISACODYL 10 MG RE SUPP: 10.0000 mg | Freq: Every day | RECTAL | Status: DC | PRN

## 2018-11-14 MED ORDER — ALUM HYDROXIDE-MAG CARBONATE 95-358 MG/15ML PO SUSP: 15.0000 mL | Freq: Two times a day (BID) | ORAL | Status: DC

## 2018-11-14 MED ORDER — PROPOFOL 10 MG/ML IV BOLUS
INTRAVENOUS | Status: AC
  Filled 2018-11-14: qty 20

## 2018-11-14 MED ORDER — ONDANSETRON HCL 4 MG PO TABS: 4.0000 mg | ORAL_TABLET | Freq: Four times a day (QID) | ORAL | Status: DC | PRN

## 2018-11-14 MED ORDER — FENTANYL CITRATE (PF) 100 MCG/2ML IJ SOLN
INTRAMUSCULAR | Status: DC | PRN
  Administered 2018-11-14 (×6): 50 ug via INTRAVENOUS

## 2018-11-14 MED ORDER — MIDAZOLAM HCL 2 MG/2ML IJ SOLN
INTRAMUSCULAR | Status: AC
  Filled 2018-11-14: qty 2

## 2018-11-14 MED ORDER — ONDANSETRON HCL 4 MG/2ML IJ SOLN: 4.0000 mg | Freq: Four times a day (QID) | INTRAMUSCULAR | Status: DC | PRN

## 2018-11-14 MED ORDER — ACETAMINOPHEN 10 MG/ML IV SOLN
INTRAVENOUS | Status: AC
  Filled 2018-11-14: qty 100

## 2018-11-14 MED ORDER — HYDROMORPHONE HCL 1 MG/ML IJ SOLN
INTRAMUSCULAR | Status: AC
  Administered 2018-11-14: 0.25 mg via INTRAVENOUS
  Filled 2018-11-14: qty 1

## 2018-11-14 MED ORDER — PROPOFOL 10 MG/ML IV BOLUS
INTRAVENOUS | Status: DC | PRN
  Administered 2018-11-14: 150 mg via INTRAVENOUS

## 2018-11-14 MED ORDER — DEXAMETHASONE SODIUM PHOSPHATE 10 MG/ML IJ SOLN
INTRAMUSCULAR | Status: AC
  Filled 2018-11-14: qty 1

## 2018-11-14 MED ORDER — ACETAMINOPHEN 325 MG PO TABS: 325.0000 mg | ORAL_TABLET | Freq: Four times a day (QID) | ORAL | Status: DC | PRN

## 2018-11-14 MED ORDER — FLEET ENEMA 7-19 GM/118ML RE ENEM: 1.0000 | ENEMA | Freq: Once | RECTAL | Status: DC | PRN

## 2018-11-14 MED ORDER — METOPROLOL TARTRATE 5 MG/5ML IV SOLN
INTRAVENOUS | Status: AC
  Filled 2018-11-14: qty 5

## 2018-11-14 MED ORDER — LIDOCAINE HCL (PF) 2 % IJ SOLN
INTRAMUSCULAR | Status: AC
  Filled 2018-11-14: qty 10

## 2018-11-14 MED ORDER — KETOROLAC TROMETHAMINE 15 MG/ML IJ SOLN
15.0000 mg | Freq: Once | INTRAMUSCULAR | Status: AC
  Administered 2018-11-14: 10:00:00 15 mg via INTRAVENOUS

## 2018-11-14 MED ORDER — BUPIVACAINE LIPOSOME 1.3 % IJ SUSP
INTRAMUSCULAR | Status: AC
  Filled 2018-11-14: qty 20

## 2018-11-14 MED ORDER — SODIUM CHLORIDE FLUSH 0.9 % IV SOLN
INTRAVENOUS | Status: AC
  Filled 2018-11-14: qty 40

## 2018-11-14 MED ORDER — ROCURONIUM BROMIDE 50 MG/5ML IV SOLN
INTRAVENOUS | Status: AC
  Filled 2018-11-14: qty 1

## 2018-11-14 MED ORDER — DEXAMETHASONE SODIUM PHOSPHATE 10 MG/ML IJ SOLN
INTRAMUSCULAR | Status: DC | PRN
  Administered 2018-11-14: 5 mg via INTRAVENOUS

## 2018-11-14 MED ORDER — OXYCODONE HCL 5 MG PO TABS
5.0000 mg | ORAL_TABLET | ORAL | Status: DC | PRN
  Administered 2018-11-14: 10 mg via ORAL
  Administered 2018-11-14: 5 mg via ORAL
  Administered 2018-11-15 (×4): 10 mg via ORAL
  Filled 2018-11-14: qty 2
  Filled 2018-11-14: qty 1
  Filled 2018-11-14 (×4): qty 2

## 2018-11-14 MED ORDER — PHENYLEPHRINE HCL (PRESSORS) 10 MG/ML IV SOLN
INTRAVENOUS | Status: AC
  Filled 2018-11-14: qty 1

## 2018-11-14 MED ORDER — METOPROLOL TARTRATE 5 MG/5ML IV SOLN
INTRAVENOUS | Status: DC | PRN
  Administered 2018-11-14: 1 mg via INTRAVENOUS

## 2018-11-14 MED ORDER — CEFAZOLIN SODIUM-DEXTROSE 2-4 GM/100ML-% IV SOLN
INTRAVENOUS | Status: AC
  Filled 2018-11-14: qty 100

## 2018-11-14 MED ORDER — METOCLOPRAMIDE HCL 5 MG/ML IJ SOLN: 5.0000 mg | Freq: Three times a day (TID) | INTRAMUSCULAR | Status: DC | PRN

## 2018-11-14 MED ORDER — ONDANSETRON HCL 4 MG/2ML IJ SOLN
INTRAMUSCULAR | Status: DC | PRN
  Administered 2018-11-14: 4 mg via INTRAVENOUS

## 2018-11-14 MED ORDER — ACETAMINOPHEN 500 MG PO TABS
1000.0000 mg | ORAL_TABLET | Freq: Four times a day (QID) | ORAL | Status: AC
  Administered 2018-11-14 – 2018-11-15 (×3): 1000 mg via ORAL
  Filled 2018-11-14 (×4): qty 2

## 2018-11-14 MED ORDER — LACTATED RINGERS IV SOLN
INTRAVENOUS | Status: DC
  Administered 2018-11-14: 07:00:00 via INTRAVENOUS

## 2018-11-14 MED ORDER — SODIUM CHLORIDE 0.9 % IV SOLN
INTRAVENOUS | Status: DC
  Administered 2018-11-14 (×2): via INTRAVENOUS

## 2018-11-14 MED ORDER — FENTANYL CITRATE (PF) 100 MCG/2ML IJ SOLN
INTRAMUSCULAR | Status: AC
  Administered 2018-11-14: 25 ug via INTRAVENOUS
  Filled 2018-11-14: qty 2

## 2018-11-14 MED ORDER — ONDANSETRON HCL 4 MG/2ML IJ SOLN
INTRAMUSCULAR | Status: AC
  Filled 2018-11-14: qty 2

## 2018-11-14 MED ORDER — KETOROLAC TROMETHAMINE 15 MG/ML IJ SOLN
7.5000 mg | Freq: Four times a day (QID) | INTRAMUSCULAR | Status: AC
  Administered 2018-11-14 – 2018-11-15 (×4): 7.5 mg via INTRAVENOUS
  Filled 2018-11-14 (×4): qty 1

## 2018-11-14 MED ORDER — FENTANYL CITRATE (PF) 100 MCG/2ML IJ SOLN
INTRAMUSCULAR | Status: AC
  Filled 2018-11-14: qty 2

## 2018-11-14 MED ORDER — ONDANSETRON HCL 4 MG/2ML IJ SOLN: 4.0000 mg | Freq: Once | INTRAMUSCULAR | Status: DC | PRN

## 2018-11-14 MED ORDER — HYDROMORPHONE HCL 1 MG/ML IJ SOLN
0.2500 mg | INTRAMUSCULAR | Status: DC | PRN
  Administered 2018-11-14: 0.25 mg via INTRAVENOUS

## 2018-11-14 MED ORDER — ROCURONIUM BROMIDE 100 MG/10ML IV SOLN
INTRAVENOUS | Status: DC | PRN
  Administered 2018-11-14: 50 mg via INTRAVENOUS
  Administered 2018-11-14: 10 mg via INTRAVENOUS

## 2018-11-14 MED ORDER — SODIUM CHLORIDE 0.9 % IV SOLN
INTRAVENOUS | Status: DC | PRN
  Administered 2018-11-14: 09:00:00 60 mL

## 2018-11-14 MED ORDER — DIPHENHYDRAMINE HCL 12.5 MG/5ML PO ELIX: 12.5000 mg | ORAL_SOLUTION | ORAL | Status: DC | PRN

## 2018-11-14 MED ORDER — CEFAZOLIN SODIUM-DEXTROSE 2-4 GM/100ML-% IV SOLN
2.0000 g | Freq: Four times a day (QID) | INTRAVENOUS | Status: AC
  Administered 2018-11-14 – 2018-11-15 (×3): 2 g via INTRAVENOUS
  Filled 2018-11-14 (×3): qty 100

## 2018-11-14 MED ORDER — SUCCINYLCHOLINE CHLORIDE 20 MG/ML IJ SOLN
INTRAMUSCULAR | Status: AC
  Filled 2018-11-14: qty 1

## 2018-11-14 MED ORDER — TRANEXAMIC ACID 1000 MG/10ML IV SOLN
INTRAVENOUS | Status: AC
  Filled 2018-11-14: qty 10

## 2018-11-14 MED ORDER — KETOROLAC TROMETHAMINE 15 MG/ML IJ SOLN
INTRAMUSCULAR | Status: AC
  Filled 2018-11-14: qty 1

## 2018-11-14 MED ORDER — PANTOPRAZOLE SODIUM 40 MG PO TBEC
40.0000 mg | DELAYED_RELEASE_TABLET | Freq: Every day | ORAL | Status: DC
  Administered 2018-11-15: 40 mg via ORAL
  Filled 2018-11-14: qty 1

## 2018-11-14 MED ORDER — METOCLOPRAMIDE HCL 10 MG PO TABS: 5.0000 mg | ORAL_TABLET | Freq: Three times a day (TID) | ORAL | Status: DC | PRN

## 2018-11-14 MED ORDER — LIDOCAINE HCL (CARDIAC) PF 100 MG/5ML IV SOSY
PREFILLED_SYRINGE | INTRAVENOUS | Status: DC | PRN
  Administered 2018-11-14: 60 mg via INTRAVENOUS

## 2018-11-14 MED ORDER — BUPIVACAINE-EPINEPHRINE (PF) 0.5% -1:200000 IJ SOLN
INTRAMUSCULAR | Status: AC
  Filled 2018-11-14: qty 30

## 2018-11-14 MED ORDER — ACETAMINOPHEN 10 MG/ML IV SOLN
INTRAVENOUS | Status: DC | PRN
  Administered 2018-11-14: 1000 mg via INTRAVENOUS

## 2018-11-14 MED ORDER — BUPIVACAINE-EPINEPHRINE (PF) 0.5% -1:200000 IJ SOLN
INTRAMUSCULAR | Status: DC | PRN
  Administered 2018-11-14: 30 mL

## 2018-11-14 MED ORDER — FENTANYL CITRATE (PF) 100 MCG/2ML IJ SOLN
25.0000 ug | INTRAMUSCULAR | Status: DC | PRN
  Administered 2018-11-14 (×4): 25 ug via INTRAVENOUS

## 2018-11-14 MED ORDER — ENOXAPARIN SODIUM 40 MG/0.4ML ~~LOC~~ SOLN
40.0000 mg | SUBCUTANEOUS | Status: DC
  Administered 2018-11-15: 40 mg via SUBCUTANEOUS
  Filled 2018-11-14: qty 0.4

## 2018-11-14 MED ORDER — DOCUSATE SODIUM 100 MG PO CAPS
100.0000 mg | ORAL_CAPSULE | Freq: Two times a day (BID) | ORAL | Status: DC
  Administered 2018-11-14 – 2018-11-15 (×2): 100 mg via ORAL
  Filled 2018-11-14 (×2): qty 1

## 2018-11-14 MED ORDER — MAGNESIUM HYDROXIDE 400 MG/5ML PO SUSP
30.0000 mL | Freq: Every day | ORAL | Status: DC | PRN
  Filled 2018-11-14: qty 30

## 2018-11-14 MED ORDER — TRAMADOL HCL 50 MG PO TABS
50.0000 mg | ORAL_TABLET | Freq: Four times a day (QID) | ORAL | Status: DC | PRN
  Administered 2018-11-14 – 2018-11-15 (×2): 50 mg via ORAL
  Filled 2018-11-14 (×2): qty 1

## 2018-11-14 MED ORDER — HYDROMORPHONE HCL 1 MG/ML IJ SOLN: 0.2500 mg | INTRAMUSCULAR | Status: DC | PRN

## 2018-11-14 SURGICAL SUPPLY — 86 items
ANCH HUM STM FR STRL LF SHLDR (Anchor) ×1 IMPLANT
ANCHOR HUMERAL S STEM FREE (Anchor) ×2 IMPLANT
APL PRP STRL LF DISP 70% ISPRP (MISCELLANEOUS) ×2
BAG DECANTER FOR FLEXI CONT (MISCELLANEOUS) ×3 IMPLANT
BLADE SAGITTAL WIDE XTHICK NO (BLADE) ×3 IMPLANT
BOWL CEMENT MIX W/ADAPTER (MISCELLANEOUS) ×1 IMPLANT
CANISTER SUCT 1200ML W/VALVE (MISCELLANEOUS) ×3 IMPLANT
CANISTER SUCT 3000ML PPV (MISCELLANEOUS) ×6 IMPLANT
CHLORAPREP W/TINT 26 (MISCELLANEOUS) ×6 IMPLANT
COOLER POLAR GLACIER W/PUMP (MISCELLANEOUS) ×3 IMPLANT
COVER BACK TABLE REUSABLE LG (DRAPES) ×3 IMPLANT
COVER WAND RF STERILE (DRAPES) ×3 IMPLANT
DRAPE 3/4 80X56 (DRAPES) ×6 IMPLANT
DRAPE FLUOR MINI C-ARM 54X84 (DRAPES) ×1 IMPLANT
DRAPE INCISE IOBAN 66X45 STRL (DRAPES) ×6 IMPLANT
DRAPE INCISE IOBAN 66X60 STRL (DRAPES) ×3 IMPLANT
DRAPE SPLIT 6X30 W/TAPE (DRAPES) ×6 IMPLANT
DRSG OPSITE POSTOP 4X8 (GAUZE/BANDAGES/DRESSINGS) ×5 IMPLANT
ELECT BLADE 6.5 EXT (BLADE) ×3 IMPLANT
ELECT CAUTERY BLADE 6.4 (BLADE) ×3 IMPLANT
GAUZE PACK 2X3YD (GAUZE/BANDAGES/DRESSINGS) ×3 IMPLANT
GLOVE BIO SURGEON STRL SZ7.5 (GLOVE) ×4 IMPLANT
GLOVE BIO SURGEON STRL SZ8 (GLOVE) ×6 IMPLANT
GLOVE BIOGEL PI IND STRL 8 (GLOVE) IMPLANT
GLOVE BIOGEL PI INDICATOR 8 (GLOVE) ×2
GLOVE INDICATOR 8.0 STRL GRN (GLOVE) ×3 IMPLANT
GOWN L4 XLG 20 PK N/S (GOWN DISPOSABLE) ×3 IMPLANT
GOWN STRL REUS W/ TWL LRG LVL3 (GOWN DISPOSABLE) ×2 IMPLANT
GOWN STRL REUS W/TWL LRG LVL3 (GOWN DISPOSABLE) ×6
HEAD HUMERAL 42-15 (Head) IMPLANT
HOOD PEEL AWAY FLYTE STAYCOOL (MISCELLANEOUS) ×9 IMPLANT
HUMERAL HEAD 42-15 (Head) ×3 IMPLANT
IV NS 100ML SINGLE PACK (IV SOLUTION) ×1 IMPLANT
KIT STABILIZATION SHOULDER (MISCELLANEOUS) ×3 IMPLANT
MASK FACE SPIDER DISP (MASK) ×3 IMPLANT
MAT ABSORB  FLUID 56X50 GRAY (MISCELLANEOUS) ×2
MAT ABSORB FLUID 56X50 GRAY (MISCELLANEOUS) ×1 IMPLANT
NDL HYPO 25X1 1.5 SAFETY (NEEDLE) ×1 IMPLANT
NDL MAYO 6 CRC TAPER PT (NEEDLE) ×1 IMPLANT
NDL MAYO CATGUT SZ1 (NEEDLE) ×3 IMPLANT
NDL MAYO CATGUT SZ4 TPR NDL (NEEDLE) ×1 IMPLANT
NDL MAYO CATGUT SZ5 (NEEDLE) ×3
NDL SAFETY ECLIPSE 18X1.5 (NEEDLE) ×2 IMPLANT
NDL SPNL 20GX3.5 QUINCKE YW (NEEDLE) ×1 IMPLANT
NDL SUT 5 .5 CRC TPR PNT MAYO (NEEDLE) ×1 IMPLANT
NEEDLE HYPO 18GX1.5 SHARP (NEEDLE) ×6
NEEDLE HYPO 22GX1.5 SAFETY (NEEDLE) ×3 IMPLANT
NEEDLE HYPO 25X1 1.5 SAFETY (NEEDLE) ×3 IMPLANT
NEEDLE MAYO 6 CRC TAPER PT (NEEDLE) ×3 IMPLANT
NEEDLE MAYO CATGUT SZ1 (NEEDLE) ×1 IMPLANT
NEEDLE MAYO CATGUT SZ4 (NEEDLE) ×3 IMPLANT
NEEDLE SPNL 20GX3.5 QUINCKE YW (NEEDLE) ×3 IMPLANT
NS IRRIG 1000ML POUR BTL (IV SOLUTION) ×3 IMPLANT
PACK ARTHROSCOPY SHOULDER (MISCELLANEOUS) ×3 IMPLANT
PAD WRAPON POLAR SHDR UNIV (MISCELLANEOUS) ×1 IMPLANT
PIN GUIDE 2.0 (PIN) ×6 IMPLANT
PIN GUIDE 3.2 (PIN) ×3
PIN GUIDE CNTRL SIDUS 3.2 (PIN) IMPLANT
PULSAVAC PLUS IRRIG FAN TIP (DISPOSABLE) ×3
SLING ULTRA II M (MISCELLANEOUS) ×3 IMPLANT
SOL .9 NS 3000ML IRR  AL (IV SOLUTION) ×2
SOL .9 NS 3000ML IRR AL (IV SOLUTION) ×1
SOL .9 NS 3000ML IRR UROMATIC (IV SOLUTION) ×1 IMPLANT
SPONGE LAP 18X18 RF (DISPOSABLE) ×3 IMPLANT
STAPLER SKIN PROX 35W (STAPLE) ×3 IMPLANT
STRAP SAFETY 5IN WIDE (MISCELLANEOUS) ×3 IMPLANT
SUT ETHIBOND #5 BRAIDED 30INL (SUTURE) ×9 IMPLANT
SUT ETHIBOND 0 MO6 C/R (SUTURE) ×3 IMPLANT
SUT ETHIBOND NAB CT1 #1 30IN (SUTURE) ×3 IMPLANT
SUT FIBERWIRE #2 38 BLUE 1/2 (SUTURE) ×3
SUT STEEL 5 (SUTURE) IMPLANT
SUT TICRON 2-0 30IN 311381 (SUTURE) ×15 IMPLANT
SUT VIC AB 0 CT1 36 (SUTURE) ×6 IMPLANT
SUT VIC AB 2-0 CT1 27 (SUTURE) ×6
SUT VIC AB 2-0 CT1 TAPERPNT 27 (SUTURE) ×2 IMPLANT
SUTURE FIBERWR #2 38 BLUE 1/2 (SUTURE) ×1 IMPLANT
SYR 10ML LL (SYRINGE) ×3 IMPLANT
SYR 30ML LL (SYRINGE) ×6 IMPLANT
SYR TB 1ML 27GX1/2 LL (SYRINGE) ×6 IMPLANT
TIP FAN IRRIG PULSAVAC PLUS (DISPOSABLE) ×1 IMPLANT
TRAY FOLEY MTR SLVR 16FR STAT (SET/KITS/TRAYS/PACK) ×3 IMPLANT
TUBE CONNECTING  6'X3/16 (MISCELLANEOUS) ×1
TUBE CONNECTING 6X3/16 (MISCELLANEOUS) ×2 IMPLANT
WATER STERILE IRR 1000ML POUR (IV SOLUTION) ×3 IMPLANT
WRAPON POLAR PAD SHDR UNIV (MISCELLANEOUS) ×3
central pin ×2 IMPLANT

## 2018-11-14 NOTE — Op Note (Signed)
11/14/2018  9:29 AM  Patient:   Ariel White  Pre-Op Diagnosis:   Avascular necrosis of humeral head, right shoulder.  Post-Op Diagnosis:   Same.  Procedure:   Humeral head resurfacing hemiarthroplasty, right shoulder.  Surgeon:   Maryagnes AmosJ. Jeffrey Poggi, MD  Assistant:   Horris LatinoLance McGhee, PA-C  Anesthesia:   GET  Findings:   As above. The rotator cuff was in satisfactory condition.  Complications:   None  EBL:   50 cc  Fluids:   700 cc crystalloid  UOP:   None  TT:   None  Drains:   None  Closure:   Staples  Implants:   Biomet Comprehensive system with a pressfit Sidus small humeral anchor and a 15 x 42 mm humeral head.  Brief Clinical Note:   The patient is a 58 year old female with a history of right shoulder pain. Her symptoms have worsened despite medications, activity modification, etc. Her plain radiographs suggested avascular necrosis of the humeral head which was confirmed by MRI scan. The patient presents at this time for a humeral head resurfacing arthroplasty of her right shoulder.  Procedure:   The patient was brought into the operating room and lain in the supine position. After satisfactory general endotracheal intubation and anesthesia was achieved, the patient was repositioned in the beach chair position using the beach chair positioner. The right shoulder and upper extremity were prepped with ChloraPrep solution before being draped sterilely. Preoperative antibiotics were administered. A standard anterior approach to the shoulder was made through an approximately 4-5 inch incision. The incision was carried down through the subcutaneous tissues to expose the deltopectoral fascia. The interval between the deltoid and pectoralis muscles was identified and this plane developed, retracting the cephalic vein laterally with the deltoid muscle. The conjoined tendon was identified. The lateral margin was dissected and the Kolbel self-retraining retractor inserted. The "three  sisters" were identified and cauterized. Bursal tissues were removed to improve visualization.  The biceps tendon was identified in the bicipital groove, and a soft tissue biceps tenodesis performed using two #0 Ethibond interrupted sutures, tacking the distal portion of the biceps tendon to the adjacent pectoralis major tendon tissues.  The subscapularis tendon was released from its attachment to the lesser tuberosity 1 cm proximal to its insertion and several tagging sutures placed. The inferior capsule was released with care after identifying and protecting the axillary nerve.   The humeral head was dislocated and, using the appropriate guide, a guidewire placed along the articular surface at the appropriate version near the supraspinatus footprint laterally.  Using the appropriate guide, two additional guide pins were introduced in parallel fashion before this platform was used as a cutting guide to remove the humeral head.  The proximal humeral surface was sized to ensure central position of the guidepin.  This guidepin was left in place and the proximal humerus prepared using the appropriate reamer and punch before the small-sized situs humeral anchor was impacted into place.  Several trial reductions were performed using multiple head sizes before settling on the 15 x 42 mm trial humeral head.  After reducing the shoulder, the arm demonstrated excellent range of motion as the hand could be brought across the chest to the opposite shoulder and brought to the top of the patient's head and to the patient's ear. The shoulder remained stable throughout this range of motion, and was stable with abduction and external rotation. The joint was dislocated and the trial component was removed. The permanent 15 x  42 mm humeral head was impacted into place. Again, the [facility] taper locking mechanism was verified using manual distraction. The shoulder was relocated and again placed through a range of motion with the  findings as described above.  The wound was copiously irrigated with bacitracin saline solution using the jet lavage system before a total of 20 cc of Exparel diluted out to 60 cc with normal saline and 30 cc of 0.5% Sensorcaine with epinephrine was injected into the pericapsular and peri-incisional tissues to help with postoperative analgesia. The subscapularis tendon was reapproximated using #2 FiberWire interrupted sutures. Several additional #0 Ethibond interrupted sutures were used to close the rotator interval. The deltopectoral interval was closed using #0 Vicryl interrupted sutures before the subcutaneous tissues were closed using 2-0 Vicryl interrupted sutures. The skin was closed using staples. Prior to closing the skin, 1 g of transexemic acid in 10 cc of normal saline was injected intra-articularly to help with postoperative bleeding. A sterile occlusive dressing was applied to the wound before the arm was placed into a shoulder immobilizer with an abduction pillow. A polar care device also was applied to the shoulder. The patient was then transferred back to a hospital bed before being awakened, extubated, and returned to the recovery room in satisfactory condition after tolerating the procedure well.

## 2018-11-14 NOTE — Anesthesia Preprocedure Evaluation (Signed)
Anesthesia Evaluation  Patient identified by MRN, date of birth, ID band Patient awake    Reviewed: Allergy & Precautions, H&P , NPO status , Patient's Chart, lab work & pertinent test results, reviewed documented beta blocker date and time   Airway Mallampati: II  TM Distance: >3 FB Neck ROM: Full   Comment: Full rom without discomfort.   Dental  (+) Teeth Intact   Pulmonary shortness of breath and with exertion, Current Smoker,    Pulmonary exam normal        Cardiovascular Exercise Tolerance: Poor negative cardio ROS Normal cardiovascular exam Rhythm:regular Rate:Normal     Neuro/Psych  Headaches, Seizures -,  PSYCHIATRIC DISORDERS Anxiety Depression Bipolar Disorder Hx of brain surgery.  Reports diminished corrordination.   Neuromuscular disease    GI/Hepatic Neg liver ROS, GERD  Medicated,  Endo/Other  negative endocrine ROS  Renal/GU negative Renal ROS  negative genitourinary   Musculoskeletal   Abdominal   Peds  Hematology negative hematology ROS (+)   Anesthesia Other Findings Past Medical History: No date: Anxiety No date: Bipolar 1 disorder (HCC) No date: Chronic pain No date: Depression No date: Eczema No date: GERD (gastroesophageal reflux disease) No date: Headache     Comment:  migraines No date: Seizures ([place]) Past Surgical History: No date: ABDOMINAL HYSTERECTOMY No date: APPENDECTOMY 1985: BRAIN TUMOR EXCISION No date: TUBAL LIGATION BMI    Body Mass Index: 20.30 kg/m     Reproductive/Obstetrics negative OB ROS                             Anesthesia Physical Anesthesia Plan  ASA: III  Anesthesia Plan: General ETT   Post-op Pain Management:    Induction:   PONV Risk Score and Plan:   Airway Management Planned:   Additional Equipment:   Intra-op Plan:   Post-operative Plan:   Informed Consent: I have reviewed the patients History and Physical,  chart, labs and discussed the procedure including the risks, benefits and alternatives for the proposed anesthesia with the patient or authorized representative who has indicated his/her understanding and acceptance.     Dental Advisory Given  Plan Discussed with: CRNA  Anesthesia Plan Comments: (Pt prefers to avoid isnb secondary to current breathing, If she is exceedingly uncormfortable in recovery, she will accept the injection if we deem it appropriate.  Situation discussed with poggi.  JA)        Anesthesia Quick Evaluation

## 2018-11-14 NOTE — TOC Initial Note (Signed)
Transition of Care [facility]) - Initial/Assessment Note    Patient Details  Name: Ariel White MRN: [id number] Date of Birth: April 16, 1961  [facility]) CM/SW Contact:    Su Hilt, RN Phone Number: 11/14/2018, 3:23 PM  Clinical Narrative:                 Met with the patient to discuss DC plan and needs She lives alone and has family that can help some, she has a cane at home She wants to use [facility] that is INN with [place], I contacted [place] and set up Home health services at 0$ copay The patient was very happy with that. No further needs at this time  Expected Discharge Plan: [place] Barriers to Discharge: Continued Medical Work up   Patient Goals and CMS Choice Patient states their goals for this hospitalization and ongoing recovery are:: go home with home health CMS Medicare.gov Compare Post Acute Care list provided to:: Patient Choice offered to / list presented to : Patient  Expected Discharge Plan and Services Expected Discharge Plan: [place]   Discharge Planning Services: CM Consult Post Acute Care Choice: [place] arrangements for the past 2 months: Single Family Home                 DME Arranged: N/A         HH Arranged: PT, OT, Nurse's Aide HH Agency: [facility] Date [place] Agency Contacted: 11/14/18 Time [place]: 1522 Representative spoke with at [place]: [place] Arrangements/Services Living arrangements for the past 2 months: [place] with:: Self Patient language and need for interpreter reviewed:: No Do you feel safe going back to the place where you live?: Yes      Need for Family Participation in Patient Care: No (Comment) Care giver support system in place?: Yes (comment) Current home services: DME(cane) Criminal Activity/Legal Involvement Pertinent to Current Situation/Hospitalization: No - Comment as needed  Activities of Daily  Living Home Assistive Devices/Equipment: None ADL Screening (condition at time of admission) Patient's cognitive ability adequate to safely complete daily activities?: Yes Is the patient deaf or have difficulty hearing?: No Does the patient have difficulty seeing, even when wearing glasses/contacts?: No Does the patient have difficulty concentrating, remembering, or making decisions?: No Patient able to express need for assistance with ADLs?: Yes Does the patient have difficulty dressing or bathing?: Yes Independently performs ADLs?: Yes (appropriate for developmental age) Does the patient have difficulty walking or climbing stairs?: No Weakness of Legs: None Weakness of Arms/Hands: Right  Permission Sought/Granted   Permission granted to share information with : Yes, Verbal Permission Granted     Permission granted to share info w AGENCY: [facility]        Emotional Assessment Appearance:: Appears stated age Attitude/Demeanor/Rapport: Engaged Affect (typically observed): Accepting, Appropriate, Calm, Happy, Hopeful, Pleasant Orientation: : Oriented to  Time, Oriented to Place, Oriented to Self, Oriented to Situation Alcohol / Substance Use: Not Applicable Psych Involvement: No (comment)  Admission diagnosis:  AVASCULAR NECROSIS OF RIGHT HUMERAL HEAD Patient Active Problem List   Diagnosis Date Noted  . Avascular necrosis of head of humerus ([place]) 11/14/2018  . Fibromyalgia 09/17/2015  . Myofascial pain syndrome 08/29/2015  . DDD (degenerative disc disease), cervical 08/29/2015  . Pain in joint, shoulder region 08/29/2015  . Chronic thoracic back pain 08/29/2015  . Depression, unspecified 02/01/2015  . Alcohol use disorder,  mild, abuse 02/01/2015  . 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, [place] - [place] [address] [place] [address] Phone:  [phone] Fax: [phone]     Social Determinants of Health (SDOH) Interventions    Readmission Risk Interventions No flowsheet data found.

## 2018-11-14 NOTE — Anesthesia Procedure Notes (Signed)
Procedure Name: Intubation Date/Time: 11/14/2018 7:29 AM Performed by: Fredderick Phenix, CRNA Pre-anesthesia Checklist: Patient identified and Emergency Drugs available Patient Re-evaluated:Patient Re-evaluated prior to induction Oxygen Delivery Method: Circle system utilized Preoxygenation: Pre-oxygenation with 100% oxygen Induction Type: IV induction Ventilation: Mask ventilation without difficulty Laryngoscope Size: McGraph and 4 (elective intubation with mcgraph with c spine stabilization) Tube type: Oral Tube size: 7.0 mm Number of attempts: 1 Airway Equipment and Method: Stylet and Oral airway Placement Confirmation: ETT inserted through vocal cords under direct vision,  positive ETCO2 and breath sounds checked- equal and bilateral Tube secured with: Tape Dental Injury: Teeth and Oropharynx as per pre-operative assessment

## 2018-11-14 NOTE — Evaluation (Signed)
Physical Therapy Evaluation Patient Details Name: Ariel White MRN: [id number] DOB: 12/09/1960 Today's Date: 11/14/2018   History of Present Illness  Pt admitted for avascular necrosis of the head of humerus and is now s/p R shoulder hemiarthroplasty. History of headaches, seizures, anxiety, depression, brain surgery, and GERD.  Clinical Impression  Pt is a pleasant 58 year old female who was admitted for R shoulder hemiarthroplasty. Pt performs bed mobility with supervision, transfers with cga, and ambulation with supervision without AD use. Pt demonstrates deficits with strength/pain/mobility. Will need to perform stair training prior to discharge home. Would benefit from skilled PT to address above deficits and promote optimal return to PLOF. Recommend transition to HHPT upon discharge from acute hospitalization.     Follow Up Recommendations Home health PT    Equipment Recommendations  None recommended by PT    Recommendations for Other Services       Precautions / Restrictions Precautions Precautions: Fall Restrictions Weight Bearing Restrictions: Yes RUE Weight Bearing: Non weight bearing      Mobility  Bed Mobility Overal bed mobility: Needs Assistance Bed Mobility: Supine to Sit     Supine to sit: Supervision     General bed mobility comments: safe technique  Transfers Overall transfer level: Needs assistance Equipment used: None Transfers: Sit to/from Stand Sit to Stand: Min guard         General transfer comment: safe technique without AD  Ambulation/Gait Ambulation/Gait assistance: Supervision Gait Distance (Feet): 5 Feet Assistive device: None Gait Pattern/deviations: Step-to pattern     General Gait Details: ambulated to recliner. Deferrs further ambulation as she just finished walking to bathroom and wants to get to chair to eat meal  Stairs            Wheelchair Mobility    Modified Rankin (Stroke Patients Only)       Balance  Overall balance assessment: History of Falls;Mild deficits observed, not formally tested                                           Pertinent Vitals/Pain Pain Assessment: 0-10 Pain Score: 6  Pain Location: R shoulder Pain Descriptors / Indicators: Operative site guarding Pain Intervention(s): Limited activity within patient's tolerance;Ice applied    Home Living Family/patient expects to be discharged to:: Private residence Living Arrangements: Alone Available Help at Discharge: Family;Available PRN/intermittently Type of Home: Mobile home Home Access: Stairs to enter Entrance Stairs-Rails: Can reach both Entrance Stairs-Number of Steps: 4 Home Layout: One level Home Equipment: Cane - single point;Cane - quad      Prior Function Level of Independence: Independent         Comments: able to perform IADLs with independence     Hand Dominance        Extremity/Trunk Assessment   Upper Extremity Assessment Upper Extremity Assessment: (R UE not tested, L UE grossly 5/5)    Lower Extremity Assessment Lower Extremity Assessment: Overall WFL for tasks assessed       Communication   Communication: No difficulties  Cognition Arousal/Alertness: Awake/alert Behavior During Therapy: WFL for tasks assessed/performed Overall Cognitive Status: Within Functional Limits for tasks assessed                                        General  Comments      Exercises Other Exercises Other Exercises: abduction brace managment while seated at EOB, reviewed precautions and discussed home situation as she lives alone. Pt anxious to return home alone   Assessment/Plan    PT Assessment Patient needs continued PT services  PT Problem List Decreased strength;Decreased mobility;Pain       PT Treatment Interventions Gait training;Stair training;Therapeutic exercise    PT Goals (Current goals can be found in the Care Plan section)  Acute Rehab PT  Goals Patient Stated Goal: to go home with adequate support PT Goal Formulation: With patient Time For Goal Achievement: 11/28/18 Potential to Achieve Goals: Good    Frequency BID   Barriers to discharge Decreased caregiver support      Co-evaluation               AM-PAC PT "6 Clicks" Mobility  Outcome Measure Help needed turning from your back to your side while in a flat bed without using bedrails?: A Little Help needed moving from lying on your back to sitting on the side of a flat bed without using bedrails?: None Help needed moving to and from a bed to a chair (including a wheelchair)?: A Little Help needed standing up from a chair using your arms (e.g., wheelchair or bedside chair)?: A Little Help needed to walk in hospital room?: A Little Help needed climbing 3-5 steps with a railing? : A Little 6 Click Score: 19    End of Session   Activity Tolerance: Patient tolerated treatment well Patient left: in chair;with chair alarm set Nurse Communication: Mobility status PT Visit Diagnosis: Muscle weakness (generalized) (M62.81);Difficulty in walking, not elsewhere classified (R26.2);Pain Pain - Right/Left: Right Pain - part of body: Shoulder    Time: 7482-7078 PT Time Calculation (min) (ACUTE ONLY): 22 min   Charges:   PT Evaluation $PT Eval Low Complexity: 1 Low PT Treatments $Therapeutic Activity: 8-22 mins        Greggory Stallion, PT, DPT [phone]   Ariel White 11/14/2018, 4:47 PM

## 2018-11-14 NOTE — Transfer of Care (Signed)
Immediate Anesthesia Transfer of Care Note  Patient: Ariel White  Procedure(s) Performed: SHOULDER HEMI-ARTHROPLASTY (Right )  Patient Location: PACU  Anesthesia Type:General  Level of Consciousness: awake, alert  and oriented  Airway & Oxygen Therapy: Patient Spontanous Breathing and Patient connected to face mask oxygen  Post-op Assessment: Report given to RN and Post -op Vital signs reviewed and stable  Post vital signs: Reviewed and stable  Last Vitals:  Vitals Value Taken Time  BP 154/74 11/14/18 0936  Temp 37 C 11/14/18 0936  Pulse 101 11/14/18 0941  Resp 21 11/14/18 0941  SpO2 100 % 11/14/18 0941  Vitals shown include unvalidated device data.  Last Pain:  Vitals:   11/14/18 0622  TempSrc: Oral  PainSc: 8       Patients Stated Pain Goal: 3 (57/90/38 3338)  Complications: No apparent anesthesia complications

## 2018-11-14 NOTE — H&P (Signed)
Paper H&P to be scanned into permanent record. H&P reviewed and patient re-examined. No changes. 

## 2018-11-14 NOTE — Anesthesia Post-op Follow-up Note (Signed)
Anesthesia QCDR form completed.        

## 2018-11-14 NOTE — Anesthesia Postprocedure Evaluation (Signed)
Anesthesia Post Note  Patient: Ariel White  Procedure(s) Performed: SHOULDER HEMI-ARTHROPLASTY (Right )  Anesthesia Post Evaluation   Last Vitals:  Vitals:   11/14/18 1113 11/14/18 1207  BP: (!) 149/71 (!) 141/71  Pulse: 88 97  Resp: 18 20  Temp: 36.6 C 36.5 C  SpO2: 100% 100%    Last Pain:  Vitals:   11/14/18 1153  TempSrc:   PainSc: [name]

## 2018-11-14 NOTE — Anesthesia Postprocedure Evaluation (Signed)
Anesthesia Post Note  Patient: Ariel White  Procedure(s) Performed: SHOULDER HEMI-ARTHROPLASTY (Right )  Anesthesia Post Evaluation   Last Vitals:  Vitals:   11/14/18 1113 11/14/18 1207  BP: (!) 149/71 (!) 141/71  Pulse: 88 97  Resp: 18 20  Temp: 36.6 C 36.5 C  SpO2: 100% 100%    Last Pain:  Vitals:   11/14/18 1153  TempSrc:   PainSc: 7                  Lindley Hiney G Makalya Nave     

## 2018-11-15 DIAGNOSIS — Z800 Family history of malignant neoplasm of digestive organs: Secondary | ICD-10-CM | POA: Diagnosis not present

## 2018-11-15 DIAGNOSIS — Z91041 Radiographic dye allergy status: Secondary | ICD-10-CM | POA: Diagnosis not present

## 2018-11-15 DIAGNOSIS — Z888 Allergy status to other drugs, medicaments and biological substances status: Secondary | ICD-10-CM | POA: Diagnosis not present

## 2018-11-15 DIAGNOSIS — Z79899 Other long term (current) drug therapy: Secondary | ICD-10-CM | POA: Diagnosis not present

## 2018-11-15 DIAGNOSIS — Z803 Family history of malignant neoplasm of breast: Secondary | ICD-10-CM | POA: Diagnosis not present

## 2018-11-15 DIAGNOSIS — Z79891 Long term (current) use of opiate analgesic: Secondary | ICD-10-CM | POA: Diagnosis not present

## 2018-11-15 DIAGNOSIS — Z820 Family history of epilepsy and other diseases of the nervous system: Secondary | ICD-10-CM | POA: Diagnosis not present

## 2018-11-15 DIAGNOSIS — Z8249 Family history of ischemic heart disease and other diseases of the circulatory system: Secondary | ICD-10-CM | POA: Diagnosis not present

## 2018-11-15 DIAGNOSIS — K219 Gastro-esophageal reflux disease without esophagitis: Secondary | ICD-10-CM | POA: Diagnosis present

## 2018-11-15 DIAGNOSIS — M879 Osteonecrosis, unspecified: Secondary | ICD-10-CM | POA: Diagnosis present

## 2018-11-15 DIAGNOSIS — Z825 Family history of asthma and other chronic lower respiratory diseases: Secondary | ICD-10-CM | POA: Diagnosis not present

## 2018-11-15 DIAGNOSIS — G8929 Other chronic pain: Secondary | ICD-10-CM | POA: Diagnosis present

## 2018-11-15 DIAGNOSIS — Z833 Family history of diabetes mellitus: Secondary | ICD-10-CM | POA: Diagnosis not present

## 2018-11-15 LAB — BASIC METABOLIC PANEL
Anion gap: 13 (ref 5–15)
BUN: 12 mg/dL (ref 6–20)
CO2: 20 mmol/L — ABNORMAL LOW (ref 22–32)
Calcium: 8.9 mg/dL (ref 8.9–10.3)
Chloride: 103 mmol/L (ref 98–111)
Creatinine, Ser: 0.83 mg/dL (ref 0.44–1.00)
GFR calc Af Amer: >60 mL/min (ref >60)
GFR calc non Af Amer: >60 mL/min (ref >60)
Glucose, Bld: 104 mg/dL — ABNORMAL HIGH (ref 70–99)
Potassium: 3.5 mmol/L (ref 3.5–5.1)
Sodium: 136 mmol/L (ref 135–145)

## 2018-11-15 LAB — CBC
HCT: 41.3 % (ref 36.0–46.0)
Hemoglobin: 13.6 g/dL (ref 12.0–15.0)
MCH: 40.2 pg — ABNORMAL HIGH (ref 26.0–34.0)
MCHC: 32.9 g/dL (ref 30.0–36.0)
MCV: 122.2 fL — ABNORMAL HIGH (ref 80.0–100.0)
Platelets: 277 10*3/uL (ref 150–400)
RBC: 3.38 MIL/uL — ABNORMAL LOW (ref 3.87–5.11)
RDW: 13.3 % (ref 11.5–15.5)
WBC: 8.5 10*3/uL (ref 4.0–10.5)
nRBC: 0 % (ref 0.0–0.2)

## 2018-11-15 MED ORDER — TRAMADOL HCL 50 MG PO TABS: 50.0000 mg | ORAL_TABLET | Freq: Four times a day (QID) | ORAL | 0 refills | Status: DC | PRN

## 2018-11-15 MED ORDER — ASPIRIN EC 325 MG PO TBEC: 325.0000 mg | DELAYED_RELEASE_TABLET | Freq: Every day | ORAL | 0 refills | Status: DC

## 2018-11-15 MED ORDER — ONDANSETRON HCL 4 MG PO TABS: 4.0000 mg | ORAL_TABLET | Freq: Four times a day (QID) | ORAL | 0 refills | Status: DC | PRN

## 2018-11-15 MED ORDER — OXYCODONE HCL 5 MG PO TABS: 5.0000 mg | ORAL_TABLET | ORAL | 0 refills | Status: DC | PRN

## 2018-11-15 NOTE — Evaluation (Signed)
Occupational Therapy Evaluation Patient Details Name: Ariel White MRN: [id number] DOB: 03/17/1961 Today's Date: 11/15/2018    History of Present Illness Pt admitted for avascular necrosis of the head of humerus and is now s/p R shoulder hemiarthroplasty. History of headaches, seizures, anxiety, depression, brain surgery, and GERD.   Clinical Impression   Ariel White was seen for an OT evaluation this date. Pt lives alone in a 1-level home with 4 steps to enter and bilateral handrails. Prior to surgery, pt was active and independent. She states she is eager to get back to working in her yard and cooking for her friends and family again. Ariel White was pleasant and engaged throughout OT evaluation. Pt has family to assist her PRN, and generally in the mornings upon DC. Pt has orders for RUE to be immobilized and will be NWBing per MD. Patient presents with impaired strength/ROM, & pain These impairments result in a decreased ability to perform self care tasks requiring mod assist for UB/LB dressing and bathing and max assist for application of polar care, compression stockings, and sling/immobilizer. Pt instructed in polar care mgt, compression stockings mgt, sling/immobilizer mgt, ROM exercises for RUE, RUE precautions, adaptive strategies for bathing/dressing/toileting/grooming, positioning and considerations for sleep, and home/routines modifications to maximize falls prevention, safety, and independence. Handout provided. OT adjusted sling/immobilizer and polar care to improve comfort, optimize positioning, and to maximize skin integrity/safety. Pt verbalized understanding of all education/training provided. Pt will benefit from skilled OT services to address these limitations and improve independence in daily tasks. Recommend HHOT services to continue therapy to maximize return to PLOF, address home/routines modifications and safety, minimize falls risk, and minimize caregiver burden.       Follow Up  Recommendations  Home health OT;Supervision - Intermittent    Equipment Recommendations  3 in 1 bedside commode;Tub/shower bench    Recommendations for Other Services       Precautions / Restrictions Precautions Precautions: Fall;Shoulder Shoulder Interventions: Shoulder sling/immobilizer;Shoulder abduction pillow Precaution Booklet Issued: No Restrictions Weight Bearing Restrictions: Yes RUE Weight Bearing: Non weight bearing      Mobility Bed Mobility Overal bed mobility: Needs Assistance Bed Mobility: Supine to Sit     Supine to sit: Supervision     General bed mobility comments: safe technique  Transfers Overall transfer level: Needs assistance Equipment used: None Transfers: Sit to/from Stand Sit to Stand: Supervision         General transfer comment: safe technique without AD    Balance Overall balance assessment: History of Falls;Mild deficits observed, not formally tested                                         ADL either performed or assessed with clinical judgement   ADL Overall ADL's : Needs assistance/impaired                                       General ADL Comments: Pt max assist to don/doff polar care & sling immobilizer. Mod assist for UB bathing and dressing tasks. Mod I for functional mobility. Pt is R hand dominant, so eating and additional two handed tasks are more limited.     Vision Baseline Vision/History: Wears glasses Wears Glasses: At all times Patient Visual Report: No change from baseline  Perception     Praxis      Pertinent Vitals/Pain Pain Score: 9  Pain Location: R shoulder Pain Descriptors / Indicators: Operative site guarding;Aching;Sore;Grimacing;Guarding Pain Intervention(s): Limited activity within patient's tolerance;Monitored during session;Ice applied;Repositioned;Premedicated before session     Hand Dominance Right   Extremity/Trunk Assessment Upper Extremity  Assessment Upper Extremity Assessment: LUE deficits/detail;RUE deficits/detail RUE Deficits / Details: s/p R shoulder hemi-arthroplasty RUE: Unable to fully assess due to pain;Unable to fully assess due to immobilization LUE Deficits / Details: WNL   Lower Extremity Assessment Lower Extremity Assessment: Overall WFL for tasks assessed   Cervical / Trunk Assessment Cervical / Trunk Assessment: Normal   Communication Communication Communication: No difficulties   Cognition Arousal/Alertness: Awake/alert Behavior During Therapy: WFL for tasks assessed/performed Overall Cognitive Status: Within Functional Limits for tasks assessed                                     General Comments       Exercises Other Exercises Other Exercises: Pt educated in falls prevention strategies, polar care mgt, sling/immobilizer mgt, activity restrictions/limitations, & adapted techniques for bathing and dressing tasks. Pt return verbalized understanding of education provided but would benefit from reinforcement of education for improved safety upon DC.   Shoulder Instructions      Home Living Family/patient expects to be discharged to:: Private residence Living Arrangements: Alone Available Help at Discharge: Family;Available PRN/intermittently Type of Home: Mobile home Home Access: Stairs to enter Entrance Stairs-Number of Steps: 4 Entrance Stairs-Rails: Can reach both Home Layout: One level     Bathroom Shower/Tub: Tub/shower unit(garden tub)   Bathroom Toilet: Standard     Home Equipment: Cane - single point;Cane - quad;Hand held shower head          Prior Functioning/Environment Level of Independence: Independent        Comments: able to perform IADLs with independence        OT Problem List: Decreased strength;Decreased coordination;Decreased range of motion;Decreased activity tolerance;Decreased safety awareness;Decreased knowledge of use of DME or AE;Decreased  knowledge of precautions;Impaired UE functional use      OT Treatment/Interventions: Self-care/ADL training;Therapeutic exercise;Therapeutic activities;DME and/or AE instruction;Patient/family education;Balance training    OT Goals(Current goals can be found in the care plan section) Acute Rehab OT Goals Patient Stated Goal: To get back to cooking and working in the yard again. OT Goal Formulation: With patient Time For Goal Achievement: 11/29/18 Potential to Achieve Goals: Good ADL Goals Pt Will Perform Grooming: sitting;with set-up;with min assist(With LRAD for improved safety and functional independence.) Pt Will Perform Upper Body Bathing: sitting;with set-up;with min assist(With LRAD for improved safety and functional independence.) Pt Will Perform Upper Body Dressing: with min assist;sitting;with adaptive equipment(With LRAD for improved safety and functional independence.)  OT Frequency: Min 2X/week   Barriers to D/C: Inaccessible home environment;Decreased caregiver support          Co-evaluation              AM-PAC OT "6 Clicks" Daily Activity     Outcome Measure Help from another person eating meals?: None Help from another person taking care of personal grooming?: A Little Help from another person toileting, which includes using toliet, bedpan, or urinal?: A Little Help from another person bathing (including washing, rinsing, drying)?: A Lot Help from another person to put on and taking off regular upper body clothing?: A Lot Help from another  person to put on and taking off regular lower body clothing?: A Little 6 Click Score: 17   End of Session Equipment Utilized During Treatment: Gait belt  Activity Tolerance: Patient tolerated treatment well Patient left: in bed;with call bell/phone within reach;with bed alarm set;with SCD's reapplied  OT Visit Diagnosis: Other abnormalities of gait and mobility (R26.89);History of falling (Z91.81)                Time:  9244-6286 OT Time Calculation (min): 43 min Charges:  OT General Charges $OT Visit: 1 Visit OT Evaluation $OT Eval Low Complexity: 1 Low OT Treatments $Self Care/Home Management : 23-37 mins  Shara Blazing, M.S., OTR/L Ascom: [phone] 11/15/18, 12:40 PM

## 2018-11-15 NOTE — Progress Notes (Signed)
Subjective: 1 Day Post-Op Procedure(s) (LRB): SHOULDER HEMI-ARTHROPLASTY (Right) Patient reports pain as moderate.   Patient is well, and has had no acute complaints or problems Plan is to go Home after hospital stay. Negative for chest pain and shortness of breath Fever: no Gastrointestinal:Negative for nausea and vomiting  Objective: Vital signs in last 24 hours: Temp:  [97.6 F (36.4 C)-98.6 F (37 C)] 98 F (36.7 C) (07/22 0407) Pulse Rate:  [88-102] 89 (07/22 0407) Resp:  [14-20] 16 (07/22 0407) BP: (140-168)/(63-97) 151/87 (07/22 0407) SpO2:  [96 %-100 %] 100 % (07/22 0407)  Intake/Output from previous day:  Intake/Output Summary (Last 24 hours) at 11/15/2018 0752 Last data filed at 11/15/2018 0317 Gross per 24 hour  Intake 2441.21 ml  Output 50 ml  Net 2391.21 ml    Intake/Output this shift: No intake/output data recorded.  Labs: Recent Labs    11/15/18 0329  HGB 13.6   Recent Labs    11/15/18 0329  WBC 8.5  RBC 3.38*  HCT 41.3  PLT 277   Recent Labs    11/15/18 0329  NA 136  K 3.5  CL 103  CO2 20*  BUN 12  CREATININE 0.83  GLUCOSE 104*  CALCIUM 8.9   No results for input(s): LABPT, INR in the last 72 hours.   EXAM General - Patient is Alert, Appropriate and Oriented Extremity - ABD soft Dorsiflexion/Plantar flexion intact Incision: dressing C/D/I  Patient is intact to light touch to the right arm this AM. Dressing/Incision - clean, dry, no drainage Motor Function - intact, moving foot and toes well on exam.   Past Medical History:  Diagnosis Date  . Anxiety   . Bipolar 1 disorder ([place])   . Chronic pain   . Depression   . Eczema   . GERD (gastroesophageal reflux disease)   . Headache    migraines  . 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   Weight as of this encounter: 50.3 kg. Advance diet Up with therapy D/C IV fluids when tolerating po intake.  Labs reviewed. Up with therapy today, need to clear stairs. Plan for d/c home this afternoon with HHPT and HH aid.  DVT Prophylaxis - Lovenox, Foot Pumps and TED hose Non-weightbearing to the right arm.  Raquel , PA-C [facility] 11/15/2018, 7:52 AM

## 2018-11-15 NOTE — Progress Notes (Signed)
Physical Therapy Treatment Patient Details Name: Ariel White MRN: [id number] DOB: 07/26/1960 Today's Date: 11/15/2018    History of Present Illness Pt admitted for avascular necrosis of the head of humerus and is now s/p R shoulder hemiarthroplasty. History of headaches, seizures, anxiety, depression, brain surgery, and GERD.    PT Comments    Pt agreeable to PT. Pt reports 9/10 pain in R shoulder. Pt has numerous questions regarding polar care; length discussion regarding use and alternate cryotherapy options when pt does not have assist. Pt demonstrates independence with bed mobility and Mod I with STS transfers using rail to stand and steady briefly. Pt ambulates with Supervision over 200 feet subjectively at baseline quality and is able to negotiate up/down 6 steps with 1 rail and step to pattern (pt's baseline stair climbing).  Plan to see pt this afternoon for continued PT to progress functional mobility and assure a safe transition home.    Follow Up Recommendations  Home health PT     Equipment Recommendations  None recommended by PT    Recommendations for Other Services       Precautions / Restrictions Precautions Precautions: Fall;Shoulder Shoulder Interventions: Shoulder sling/immobilizer;Shoulder abduction pillow Precaution Booklet Issued: No Required Braces or Orthoses: (SHOULDER IMMOBILIZER) Restrictions Weight Bearing Restrictions: Yes RUE Weight Bearing: Non weight bearing    Mobility  Bed Mobility Overal bed mobility: Independent Bed Mobility: Supine to Sit     Supine to sit: Supervision     General bed mobility comments: safe technique  Transfers Overall transfer level: Modified independent Equipment used: None Transfers: Sit to/from Stand Sit to Stand: Modified independent (Device/Increase time)         General transfer comment: uses rail to stand and steady  Ambulation/Gait Ambulation/Gait assistance: Supervision Gait Distance (Feet): 290  Feet Assistive device: None Gait Pattern/deviations: Step-through pattern(B partial step through pattern)     General Gait Details: Pt baseline ambulation is a partial step through gait with stiff trunk per pt   Stairs Stairs: Yes Stairs assistance: Supervision Stair Management: One rail Left;Step to pattern Number of Stairs: 6 General stair comments: Performs as she did prior to surgery per pt   Wheelchair Mobility    Modified Rankin (Stroke Patients Only)       Balance Overall balance assessment: History of Falls;Mild deficits observed, not formally tested                                          Cognition Arousal/Alertness: Awake/alert Behavior During Therapy: WFL for tasks assessed/performed Overall Cognitive Status: Within Functional Limits for tasks assessed                                        Exercises Other Exercises Other Exercises: Pt educated in falls prevention strategies, polar care mgt, sling/immobilizer mgt, activity restrictions/limitations, & adapted techniques for bathing and dressing tasks. Pt return verbalized understanding of education provided but would benefit from reinforcement of education for improved safety upon DC.    General Comments        Pertinent Vitals/Pain Pain Assessment: 0-10 Pain Score: 9  Pain Location: R shoulder Pain Descriptors / Indicators: Constant;Aching Pain Intervention(s): Premedicated before session;Ice applied    Home Living Family/patient expects to be discharged to:: Private residence Living Arrangements: Alone Available Help  at Discharge: Family;Available PRN/intermittently Type of Home: Mobile home Home Access: Stairs to enter Entrance Stairs-Rails: Can reach both Home Layout: One level Home Equipment: Cane - single point;Cane - quad;Hand held shower head      Prior Function Level of Independence: Independent      Comments: able to perform IADLs with independence    PT Goals (current goals can now be found in the care plan section) Acute Rehab PT Goals Patient Stated Goal: To get back to cooking and working in the yard again. Progress towards PT goals: Progressing toward goals    Frequency    BID      PT Plan Current plan remains appropriate    Co-evaluation              AM-PAC PT "6 Clicks" Mobility   Outcome Measure  Help needed turning from your back to your side while in a flat bed without using bedrails?: None Help needed moving from lying on your back to sitting on the side of a flat bed without using bedrails?: None Help needed moving to and from a bed to a chair (including a wheelchair)?: None Help needed standing up from a chair using your arms (e.g., wheelchair or bedside chair)?: None Help needed to walk in hospital room?: None Help needed climbing 3-5 steps with a railing? : None 6 Click Score: 24    End of Session Equipment Utilized During Treatment: Gait belt Activity Tolerance: Patient tolerated treatment well Patient left: in bed;with call bell/phone within reach   PT Visit Diagnosis: Muscle weakness (generalized) (M62.81);Difficulty in walking, not elsewhere classified (R26.2);Pain Pain - Right/Left: Right Pain - part of body: Shoulder     Time: 2119-4174 PT Time Calculation (min) (ACUTE ONLY): 27 min  Charges:  $Gait Training: 23-37 mins                      Larae Grooms, PTA 11/15/2018, 4:05 PM

## 2018-11-15 NOTE — Progress Notes (Signed)
Physical Therapy Treatment Patient Details Name: Ariel White MRN: [id number] DOB: 03/06/1961 Today's Date: 11/15/2018    History of Present Illness Pt admitted for avascular necrosis of the head of humerus and is now s/p R shoulder hemiarthroplasty. History of headaches, seizures, anxiety, depression, brain surgery, and GERD.    PT Comments    Pt agreeable to PT this afternoon; pain slightly decreased from morning session to 7 post medication. Pt states, "I need to walk". Pt ambulates with supervision 300 feet and demonstrates static stand tolerance for 5 min to talk with SW during ambulation. Continues to demonstrate bed mobility and transfers with I and Mod I respectively. Pt notified SW that she will need a 3 in 1 to be able to use her tub. Pt notes she has groceries in place at home and that her sister is able to check in on her; pt feels able to go home at this time. Continue PT should pt stay to ensure safe transition home.  Follow Up Recommendations  Home health PT     Equipment Recommendations  Other (comment)(pt requesting 3 in 1)    Recommendations for Other Services       Precautions / Restrictions Precautions Precautions: Fall;Shoulder Shoulder Interventions: Shoulder sling/immobilizer;Shoulder abduction pillow Precaution Booklet Issued: No Required Braces or Orthoses: (SHOULDER IMMOBILIZER) Restrictions Weight Bearing Restrictions: Yes RUE Weight Bearing: Non weight bearing    Mobility  Bed Mobility Overal bed mobility: Independent Bed Mobility: Supine to Sit     Supine to sit: Supervision     General bed mobility comments: safe technique  Transfers Overall transfer level: Modified independent Equipment used: None Transfers: Sit to/from Stand Sit to Stand: Modified independent (Device/Increase time)         General transfer comment: uses rail to stand and steady  Ambulation/Gait Ambulation/Gait assistance: Supervision Gait Distance (Feet): 300  Feet Assistive device: None Gait Pattern/deviations: Step-through pattern(B partial step through pattern)     General Gait Details: Pt baseline ambulation is a partial step through gait with stiff trunk per pt   Stairs        Wheelchair Mobility    Modified Rankin (Stroke Patients Only)       Balance Overall balance assessment: History of Falls;Mild deficits observed, not formally tested                                          Cognition Arousal/Alertness: Awake/alert Behavior During Therapy: WFL for tasks assessed/performed Overall Cognitive Status: Within Functional Limits for tasks assessed                                        Exercises Other Exercises Other Exercises: Pt educated in falls prevention strategies, polar care mgt, sling/immobilizer mgt, activity restrictions/limitations, & adapted techniques for bathing and dressing tasks. Pt return verbalized understanding of education provided but would benefit from reinforcement of education for improved safety upon DC.    General Comments        Pertinent Vitals/Pain Pain Assessment: 0-10 Pain Score: 7  Pain Location: R shoulder Pain Descriptors / Indicators: Constant;Aching Pain Intervention(s): Premedicated before session    [place] expects to be discharged to:: Private residence Living Arrangements: Alone Available Help at Discharge: Family;Available PRN/intermittently Type of Home: Mobile home Home Access: Stairs to  enter Entrance Stairs-Rails: Can reach both Home Layout: One level Home Equipment: Cane - single point;Cane - quad;Hand held shower head      Prior Function Level of Independence: Independent      Comments: able to perform IADLs with independence   PT Goals (current goals can now be found in the care plan section) Acute Rehab PT Goals Patient Stated Goal: To get back to cooking and working in the yard again. Progress towards  PT goals: Progressing toward goals    Frequency    BID      PT Plan Current plan remains appropriate    Co-evaluation              AM-PAC PT "6 Clicks" Mobility   Outcome Measure  Help needed turning from your back to your side while in a flat bed without using bedrails?: None Help needed moving from lying on your back to sitting on the side of a flat bed without using bedrails?: None Help needed moving to and from a bed to a chair (including a wheelchair)?: None Help needed standing up from a chair using your arms (e.g., wheelchair or bedside chair)?: None Help needed to walk in hospital room?: None Help needed climbing 3-5 steps with a railing? : None 6 Click Score: 24    End of Session Equipment Utilized During Treatment: Gait belt Activity Tolerance: Patient tolerated treatment well Patient left: in bed;with call bell/phone within reach Nurse Communication: Other (comment)(SW regarding session) PT Visit Diagnosis: Muscle weakness (generalized) (M62.81);Difficulty in walking, not elsewhere classified (R26.2);Pain Pain - Right/Left: Right Pain - part of body: Shoulder     Time: [id number] PT Time Calculation (min) (ACUTE ONLY): 21 min  Charges:  $Gait Training: 8-22 mins                      Scot DockHeidi E , PTA 11/15/2018, 4:17 PM

## 2018-11-15 NOTE — Progress Notes (Signed)
Pt. Discharged to home via private vehicle. Discharge instructions and medication regimen reviewed at bedside with patient. Pt. verbalizes understanding of instructions and medication regimen. BSC delivered to rm. Prescriptions with pt. Patient assessment unchanged from this morning. IV discontinued per policy.

## 2018-11-15 NOTE — Discharge Summary (Signed)
Physician Discharge Summary  Patient ID: Ariel White MRN: [id number] DOB/AGE: 1961/03/05 58 y.o.  Admit date: 11/14/2018 Discharge date: 11/15/2018  Admission Diagnoses:  AVASCULAR NECROSIS OF RIGHT HUMERAL HEAD  Discharge Diagnoses: Patient Active Problem List   Diagnosis Date Noted  . Avascular necrosis of head of humerus ([place]) 11/14/2018  . Fibromyalgia 09/17/2015  . Myofascial pain syndrome 08/29/2015  . DDD (degenerative disc disease), cervical 08/29/2015  . Pain in joint, shoulder region 08/29/2015  . Chronic thoracic back pain 08/29/2015  . Depression, unspecified 02/01/2015  . Alcohol use disorder, mild, abuse 02/01/2015  . 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 Anxiety   . Bipolar 1 disorder ([place])   . Chronic pain   . Depression   . Eczema   . GERD (gastroesophageal reflux disease)   . Headache    migraines  . Seizures ([place])      Transfusion: None.   Consultants (if any):   Discharged Condition: Improved  Hospital Course: Ariel White is an 58 y.o. female who was admitted 11/14/2018 with a diagnosis of avascular necrosis of the right humeral head and went to the operating room on 11/14/2018 and underwent the above named procedures.    Surgeries: Procedure(s): SHOULDER HEMI-ARTHROPLASTY on 11/14/2018 Patient tolerated the surgery well. Taken to PACU where she was stabilized and then transferred to the orthopedic floor.  Started on Lovenox 40mg  q 24 hrs. Foot pumps applied bilaterally at 80 mm. Heels elevated on bed with rolled towels. No evidence of DVT. Negative [place]. Physical therapy started on day #1 for gait training and transfer. OT started day #1 for ADL and assisted devices.  Patient's IV was d/c on POD1.  Implants: Biomet Comprehensive system with a pressfit Sidus small humeral anchor and a 15 x 42 mm humeral head.  She was given perioperative antibiotics:   Anti-infectives (From admission, onward)   Start     Dose/Rate Route Frequency Ordered Stop   11/14/18 1345  ceFAZolin (ANCEF) IVPB 2g/100 mL premix     2 g 200 mL/hr over 30 Minutes Intravenous Every 6 hours 11/14/18 1117 11/15/18 0210   11/14/18 0646  ceFAZolin (ANCEF) 2-4 GM/100ML-% IVPB    Note to Pharmacy: Sylvester Harder   : cabinet override      11/14/18 0646 11/14/18 0743   11/13/18 2145  ceFAZolin (ANCEF) IVPB 2g/100 mL premix     2 g 200 mL/hr over 30 Minutes Intravenous  Once 11/13/18 2142 11/14/18 0743    .  She was given sequential compression devices, early ambulation, and Lovenox for DVT prophylaxis.  She benefited maximally from the hospital stay and there were no complications.    Recent vital signs:  Vitals:   11/15/18 0135 11/15/18 0407  BP: (!) 168/97 (!) 151/87  Pulse: 90 89  Resp: 18 16  Temp: 98.2 F (36.8 C) 98 F (36.7 C)  SpO2: 100% 100%    Recent laboratory studies:  Lab Results  Component Value Date   HGB 13.6 11/15/2018   HGB 14.2 11/07/2018   HGB 13.5 08/07/2015   Lab Results  Component Value Date   WBC 8.5 11/15/2018   PLT 277 11/15/2018   Lab Results  Component Value Date   INR 0.88 02/01/2015   Lab Results  Component Value Date   NA 136 11/15/2018   K 3.5 11/15/2018   CL 103 11/15/2018   CO2 20 (L) 11/15/2018  BUN 12 11/15/2018   CREATININE 0.83 11/15/2018   GLUCOSE 104 (H) 11/15/2018    Discharge Medications:   Allergies as of 11/15/2018      Reactions   Compazine [prochlorperazine Edisylate] Other (See Comments)   euphoria   Ivp Dye [iodinated Diagnostic Agents] Anaphylaxis   "stopped her heart"   Baclofen Other (See Comments)   dizziness   Imitrex [sumatriptan] Swelling   "throat swelling"   Voltaren [diclofenac]       Medication List    TAKE these medications   aluminum hydroxide-magnesium carbonate 95-358 MG/15ML Susp Commonly known as: GAVISCON Take by mouth.   Gaviscon Extra Strength 160-105 MG  Chew Generic drug: Alum Hydroxide-Mag Carbonate Chew 1 tablet by mouth 2 (two) times daily as needed.   aspirin EC 325 MG tablet Take 1 tablet (325 mg total) by mouth daily.   diphenhydramine-acetaminophen 25-500 MG Tabs tablet Commonly known as: TYLENOL PM Take 1 tablet by mouth at bedtime as needed.   ondansetron 4 MG tablet Commonly known as: ZOFRAN Take 1 tablet (4 mg total) by mouth every 6 (six) hours as needed for nausea.   oxyCODONE 5 MG immediate release tablet Commonly known as: Oxy IR/ROXICODONE Take 1-2 tablets (5-10 mg total) by mouth every 4 (four) hours as needed for moderate pain.   pantoprazole 40 MG tablet Commonly known as: PROTONIX Take 40 mg by mouth daily.   traMADol 50 MG tablet Commonly known as: ULTRAM Take 1 tablet (50 mg total) by mouth every 6 (six) hours as needed for moderate pain.   Tylenol Arthritis Pain 650 MG CR tablet Generic drug: acetaminophen Take 650 mg by mouth every 8 (eight) hours as needed for pain.      Diagnostic Studies: Dg Shoulder Right Port  Result Date: 11/14/2018 CLINICAL DATA:  Right shoulder hemiarthroplasty EXAM: PORTABLE RIGHT SHOULDER COMPARISON:  MRI 09/12/2018 FINDINGS: Changes of right shoulder hemiarthroplasty. Normal alignment. No visible complicating feature. IMPRESSION: As above. Electronically Signed   By: Charlett NoseKevin  Dover M.D.   On: 11/14/2018 10:22   Disposition: Plan for d/c home this afternoon pending progress with PT.  Follow-up Information    Anson OregonMcGhee, Raedyn Wenke Lance, PA-C Follow up in 14 day(s).   Specialty: Physician Assistant Why: Mindi [employer]. Contact information: [address] [facility] [place] [address] [phone]          Signed: Meriel PicaJames L Koreena Joost PA-C 11/15/2018, 7:57 AM

## 2018-11-15 NOTE — Discharge Instructions (Signed)
Diet: As you were doing prior to hospitalization  ° °Shower:  May shower but keep the wounds dry, use an occlusive plastic wrap, NO SOAKING IN TUB.  If the bandage gets wet, change with a clean dry gauze. ° °Dressing:  You may change your dressing as needed. Change the dressing with sterile gauze dressing.   ° °Activity:  Increase activity slowly as tolerated, but follow the weight bearing instructions below.  No lifting or driving for 6 weeks. ° °Weight Bearing:   Non-weightbearing to the right arm. ° °Blood Clot Prevention: Take 1 325 mg aspirin daily for blood clot prevention. ° °To prevent constipation: you may use a stool softener such as - ° °Colace (over the counter) 100 mg by mouth twice a day  °Drink plenty of fluids (prune juice may be helpful) and high fiber foods °Miralax (over the counter) for constipation as needed.   ° °Itching:  If you experience itching with your medications, try taking only a single pain pill, or even half a pain pill at a time.  You may take up to 10 pain pills per day, and you can also use benadryl over the counter for itching or also to help with sleep.  ° °Precautions:  If you experience chest pain or shortness of breath - call 911 immediately for transfer to the hospital emergency department!! ° °If you develop a fever greater that 101 F, purulent drainage from wound, increased redness or drainage from wound, or calf pain-Call [facility]                                              °Follow- Up Appointment:  Please call for an appointment to be seen in 2 weeks at [facility] °

## 2018-11-15 NOTE — Progress Notes (Addendum)
Patient called out to desk upset requesting for her door to be shut. Patient is upset saying she have heard people talking about her. I assured her that no one was saying anything about her. Patient also told me to get out of her room. I sent the charge nurse in to talk to patient.I had just left patient room 10 min prior to her calling out and patient has complaints.

## 2019-01-14 ENCOUNTER — Emergency Department (HOSPITAL_COMMUNITY): Payer: Medicare HMO

## 2019-01-14 ENCOUNTER — Inpatient Hospital Stay (HOSPITAL_COMMUNITY)
Admission: EM | Admit: 2019-01-14 | Discharge: 2019-01-22 | DRG: 071 | Disposition: A | Discharge status: Home / Self Care | Payer: Medicare HMO | Attending: Internal Medicine | Admitting: Internal Medicine

## 2019-01-14 ENCOUNTER — Encounter (HOSPITAL_COMMUNITY): Payer: Self-pay | Admitting: Emergency Medicine

## 2019-01-14 DIAGNOSIS — E860 Dehydration: Secondary | ICD-10-CM | POA: Diagnosis present

## 2019-01-14 DIAGNOSIS — Z888 Allergy status to other drugs, medicaments and biological substances status: Secondary | ICD-10-CM

## 2019-01-14 DIAGNOSIS — E876 Hypokalemia: Secondary | ICD-10-CM | POA: Diagnosis not present

## 2019-01-14 DIAGNOSIS — Z90710 Acquired absence of both cervix and uterus: Secondary | ICD-10-CM

## 2019-01-14 DIAGNOSIS — F17210 Nicotine dependence, cigarettes, uncomplicated: Secondary | ICD-10-CM | POA: Diagnosis present

## 2019-01-14 DIAGNOSIS — R55 Syncope and collapse: Secondary | ICD-10-CM | POA: Diagnosis not present

## 2019-01-14 DIAGNOSIS — E538 Deficiency of other specified B group vitamins: Secondary | ICD-10-CM | POA: Diagnosis present

## 2019-01-14 DIAGNOSIS — M79605 Pain in left leg: Secondary | ICD-10-CM | POA: Diagnosis present

## 2019-01-14 DIAGNOSIS — Z79899 Other long term (current) drug therapy: Secondary | ICD-10-CM

## 2019-01-14 DIAGNOSIS — M25511 Pain in right shoulder: Secondary | ICD-10-CM | POA: Diagnosis present

## 2019-01-14 DIAGNOSIS — S0033XA Contusion of nose, initial encounter: Secondary | ICD-10-CM | POA: Diagnosis present

## 2019-01-14 DIAGNOSIS — W1830XA Fall on same level, unspecified, initial encounter: Secondary | ICD-10-CM | POA: Diagnosis present

## 2019-01-14 DIAGNOSIS — R296 Repeated falls: Secondary | ICD-10-CM | POA: Diagnosis present

## 2019-01-14 DIAGNOSIS — G8929 Other chronic pain: Secondary | ICD-10-CM | POA: Diagnosis present

## 2019-01-14 DIAGNOSIS — F320 Major depressive disorder, single episode, mild: Secondary | ICD-10-CM

## 2019-01-14 DIAGNOSIS — Y92009 Unspecified place in unspecified non-institutional (private) residence as the place of occurrence of the external cause: Secondary | ICD-10-CM

## 2019-01-14 DIAGNOSIS — S0011XA Contusion of right eyelid and periocular area, initial encounter: Secondary | ICD-10-CM | POA: Diagnosis present

## 2019-01-14 DIAGNOSIS — F419 Anxiety disorder, unspecified: Secondary | ICD-10-CM

## 2019-01-14 DIAGNOSIS — F319 Bipolar disorder, unspecified: Secondary | ICD-10-CM | POA: Diagnosis present

## 2019-01-14 DIAGNOSIS — G9341 Metabolic encephalopathy: Principal | ICD-10-CM | POA: Diagnosis present

## 2019-01-14 DIAGNOSIS — W19XXXA Unspecified fall, initial encounter: Secondary | ICD-10-CM | POA: Insufficient documentation

## 2019-01-14 DIAGNOSIS — Z87892 Personal history of anaphylaxis: Secondary | ICD-10-CM

## 2019-01-14 DIAGNOSIS — Z781 Physical restraint status: Secondary | ICD-10-CM

## 2019-01-14 DIAGNOSIS — F329 Major depressive disorder, single episode, unspecified: Secondary | ICD-10-CM | POA: Diagnosis present

## 2019-01-14 DIAGNOSIS — T796XXA Traumatic ischemia of muscle, initial encounter: Secondary | ICD-10-CM | POA: Diagnosis not present

## 2019-01-14 DIAGNOSIS — R51 Headache: Secondary | ICD-10-CM | POA: Diagnosis present

## 2019-01-14 DIAGNOSIS — Z20828 Contact with and (suspected) exposure to other viral communicable diseases: Secondary | ICD-10-CM | POA: Diagnosis present

## 2019-01-14 DIAGNOSIS — Z803 Family history of malignant neoplasm of breast: Secondary | ICD-10-CM

## 2019-01-14 DIAGNOSIS — Z91041 Radiographic dye allergy status: Secondary | ICD-10-CM

## 2019-01-14 DIAGNOSIS — Z96611 Presence of right artificial shoulder joint: Secondary | ICD-10-CM | POA: Diagnosis present

## 2019-01-14 DIAGNOSIS — M6282 Rhabdomyolysis: Secondary | ICD-10-CM | POA: Diagnosis present

## 2019-01-14 DIAGNOSIS — D7589 Other specified diseases of blood and blood-forming organs: Secondary | ICD-10-CM | POA: Diagnosis present

## 2019-01-14 DIAGNOSIS — K219 Gastro-esophageal reflux disease without esophagitis: Secondary | ICD-10-CM | POA: Diagnosis present

## 2019-01-14 DIAGNOSIS — L309 Dermatitis, unspecified: Secondary | ICD-10-CM | POA: Diagnosis present

## 2019-01-14 DIAGNOSIS — Z9851 Tubal ligation status: Secondary | ICD-10-CM

## 2019-01-14 DIAGNOSIS — F1010 Alcohol abuse, uncomplicated: Secondary | ICD-10-CM | POA: Diagnosis present

## 2019-01-14 DIAGNOSIS — S8011XA Contusion of right lower leg, initial encounter: Secondary | ICD-10-CM | POA: Diagnosis present

## 2019-01-14 DIAGNOSIS — Z9181 History of falling: Secondary | ICD-10-CM

## 2019-01-14 DIAGNOSIS — R4182 Altered mental status, unspecified: Secondary | ICD-10-CM | POA: Diagnosis not present

## 2019-01-14 DIAGNOSIS — S8012XA Contusion of left lower leg, initial encounter: Secondary | ICD-10-CM | POA: Diagnosis present

## 2019-01-14 DIAGNOSIS — Z720 Tobacco use: Secondary | ICD-10-CM

## 2019-01-14 DIAGNOSIS — F32A Depression, unspecified: Secondary | ICD-10-CM | POA: Diagnosis present

## 2019-01-14 LAB — CBC
HCT: 40.4 % (ref 36.0–46.0)
HCT: 35.4 % — ABNORMAL LOW (ref 36.0–46.0)
Hemoglobin: 14 g/dL (ref 12.0–15.0)
Hemoglobin: 11.8 g/dL — ABNORMAL LOW (ref 12.0–15.0)
MCH: 38.6 pg — ABNORMAL HIGH (ref 26.0–34.0)
MCH: 38.3 pg — ABNORMAL HIGH (ref 26.0–34.0)
MCHC: 34.7 g/dL (ref 30.0–36.0)
MCHC: 33.3 g/dL (ref 30.0–36.0)
MCV: 111.3 fL — ABNORMAL HIGH (ref 80.0–100.0)
MCV: 114.9 fL — ABNORMAL HIGH (ref 80.0–100.0)
Platelets: 419 10*3/uL — ABNORMAL HIGH (ref 150–400)
Platelets: 379 10*3/uL (ref 150–400)
RBC: 3.63 MIL/uL — ABNORMAL LOW (ref 3.87–5.11)
RBC: 3.08 MIL/uL — ABNORMAL LOW (ref 3.87–5.11)
RDW: 12.9 % (ref 11.5–15.5)
RDW: 13.2 % (ref 11.5–15.5)
WBC: 8.4 10*3/uL (ref 4.0–10.5)
WBC: 7.4 10*3/uL (ref 4.0–10.5)
nRBC: 0 % (ref 0.0–0.2)
nRBC: 0 % (ref 0.0–0.2)

## 2019-01-14 LAB — ETHANOL: Alcohol, Ethyl (B): <10 mg/dL (ref <10)

## 2019-01-14 LAB — ACETAMINOPHEN LEVEL: Acetaminophen (Tylenol), Serum: <10 ug/mL — ABNORMAL LOW (ref 10–30)

## 2019-01-14 LAB — TROPONIN I (HIGH SENSITIVITY)
Troponin I (High Sensitivity): 9 ng/L (ref <18)
Troponin I (High Sensitivity): 9 ng/L (ref <18)

## 2019-01-14 LAB — COMPREHENSIVE METABOLIC PANEL
ALT: 18 U/L (ref 0–44)
AST: 60 U/L — ABNORMAL HIGH (ref 15–41)
Albumin: 4 g/dL (ref 3.5–5.0)
Alkaline Phosphatase: 87 U/L (ref 38–126)
Anion gap: 17 — ABNORMAL HIGH (ref 5–15)
BUN: 18 mg/dL (ref 6–20)
CO2: 16 mmol/L — ABNORMAL LOW (ref 22–32)
Calcium: 9.8 mg/dL (ref 8.9–10.3)
Chloride: 102 mmol/L (ref 98–111)
Creatinine, Ser: 1.01 mg/dL — ABNORMAL HIGH (ref 0.44–1.00)
GFR calc Af Amer: >60 mL/min (ref >60)
GFR calc non Af Amer: >60 mL/min (ref >60)
Glucose, Bld: 91 mg/dL (ref 70–99)
Potassium: 4 mmol/L (ref 3.5–5.1)
Sodium: 135 mmol/L (ref 135–145)
Total Bilirubin: 0.9 mg/dL (ref 0.3–1.2)
Total Protein: 7 g/dL (ref 6.5–8.1)

## 2019-01-14 LAB — CREATININE, SERUM
Creatinine, Ser: 0.82 mg/dL (ref 0.44–1.00)
GFR calc Af Amer: >60 mL/min (ref >60)
GFR calc non Af Amer: >60 mL/min (ref >60)

## 2019-01-14 LAB — SARS CORONAVIRUS 2 (TAT 6-24 HRS): SARS Coronavirus 2: NEGATIVE

## 2019-01-14 LAB — PHOSPHORUS: Phosphorus: 3 mg/dL (ref 2.5–4.6)

## 2019-01-14 LAB — VITAMIN B12: Vitamin B-12: 118 pg/mL — ABNORMAL LOW (ref 180–914)

## 2019-01-14 LAB — MAGNESIUM: Magnesium: 1.5 mg/dL — ABNORMAL LOW (ref 1.7–2.4)

## 2019-01-14 LAB — SALICYLATE LEVEL: Salicylate Lvl: 14.4 mg/dL (ref 2.8–30.0)

## 2019-01-14 LAB — TSH: TSH: 2.082 u[IU]/mL (ref 0.350–4.500)

## 2019-01-14 LAB — LACTIC ACID, PLASMA: Lactic Acid, Venous: 1.5 mmol/L (ref 0.5–1.9)

## 2019-01-14 LAB — CK: Total CK: 1792 U/L — ABNORMAL HIGH (ref 38–234)

## 2019-01-14 MED ORDER — SODIUM CHLORIDE 0.9 % IV SOLN
INTRAVENOUS | Status: DC
  Administered 2019-01-14 – 2019-01-15 (×3): via INTRAVENOUS

## 2019-01-14 MED ORDER — LORAZEPAM 2 MG/ML IJ SOLN
0.0000 mg | Freq: Two times a day (BID) | INTRAMUSCULAR | Status: DC
  Administered 2019-01-16: 2 mg via INTRAVENOUS
  Filled 2019-01-14: qty 1

## 2019-01-14 MED ORDER — LORAZEPAM 2 MG/ML IJ SOLN
0.0000 mg | Freq: Four times a day (QID) | INTRAMUSCULAR | Status: AC
  Administered 2019-01-14 (×2): 1 mg via INTRAVENOUS
  Administered 2019-01-15 (×3): 2 mg via INTRAVENOUS
  Administered 2019-01-15: 4 mg via INTRAVENOUS
  Administered 2019-01-16: 05:00:00 2 mg via INTRAVENOUS
  Filled 2019-01-14 (×2): qty 1
  Filled 2019-01-14 (×2): qty 2
  Filled 2019-01-14 (×2): qty 1

## 2019-01-14 MED ORDER — ENOXAPARIN SODIUM 40 MG/0.4ML ~~LOC~~ SOLN
40.0000 mg | SUBCUTANEOUS | Status: DC
  Administered 2019-01-15 – 2019-01-21 (×8): 40 mg via SUBCUTANEOUS
  Filled 2019-01-14 (×9): qty 0.4

## 2019-01-14 MED ORDER — VITAMIN B-1 100 MG PO TABS
100.0000 mg | ORAL_TABLET | Freq: Every day | ORAL | Status: DC
  Administered 2019-01-16 – 2019-01-17 (×2): 100 mg via ORAL
  Filled 2019-01-14 (×4): qty 1

## 2019-01-14 MED ORDER — FENTANYL CITRATE (PF) 100 MCG/2ML IJ SOLN
50.0000 ug | Freq: Once | INTRAMUSCULAR | Status: DC
  Filled 2019-01-14: qty 2

## 2019-01-14 MED ORDER — LORAZEPAM 1 MG PO TABS: 0.0000 mg | ORAL_TABLET | Freq: Two times a day (BID) | ORAL | Status: DC

## 2019-01-14 MED ORDER — NICOTINE 14 MG/24HR TD PT24
14.0000 mg | MEDICATED_PATCH | Freq: Every day | TRANSDERMAL | Status: DC
  Filled 2019-01-14 (×8): qty 1

## 2019-01-14 MED ORDER — LORAZEPAM 1 MG PO TABS
0.0000 mg | ORAL_TABLET | Freq: Four times a day (QID) | ORAL | Status: AC
  Administered 2019-01-16: 1 mg via ORAL
  Filled 2019-01-14 (×2): qty 4

## 2019-01-14 MED ORDER — PANTOPRAZOLE SODIUM 40 MG PO TBEC
40.0000 mg | DELAYED_RELEASE_TABLET | Freq: Every day | ORAL | Status: DC
  Administered 2019-01-15 – 2019-01-22 (×8): 40 mg via ORAL
  Filled 2019-01-14 (×9): qty 1

## 2019-01-14 MED ORDER — LORAZEPAM 2 MG/ML IJ SOLN
1.0000 mg | Freq: Once | INTRAMUSCULAR | Status: AC
  Administered 2019-01-14: 1 mg via INTRAVENOUS
  Filled 2019-01-14: qty 1

## 2019-01-14 MED ORDER — ONDANSETRON HCL 4 MG/2ML IJ SOLN: 4.0000 mg | Freq: Four times a day (QID) | INTRAMUSCULAR | Status: DC | PRN

## 2019-01-14 MED ORDER — ADULT MULTIVITAMIN W/MINERALS CH
1.0000 | ORAL_TABLET | Freq: Every day | ORAL | Status: DC
  Administered 2019-01-16 – 2019-01-22 (×7): 1 via ORAL
  Filled 2019-01-14 (×8): qty 1

## 2019-01-14 MED ORDER — SODIUM CHLORIDE 0.9 % IV BOLUS
1000.0000 mL | Freq: Once | INTRAVENOUS | Status: AC
  Administered 2019-01-14: 1000 mL via INTRAVENOUS

## 2019-01-14 MED ORDER — FOLIC ACID 1 MG PO TABS
1.0000 mg | ORAL_TABLET | Freq: Every day | ORAL | Status: DC
  Administered 2019-01-16 – 2019-01-22 (×7): 1 mg via ORAL
  Filled 2019-01-14 (×8): qty 1

## 2019-01-14 MED ORDER — HYDROMORPHONE HCL 1 MG/ML IJ SOLN: 0.5000 mg | Freq: Once | INTRAMUSCULAR | Status: DC

## 2019-01-14 MED ORDER — THIAMINE HCL 100 MG/ML IJ SOLN
100.0000 mg | Freq: Every day | INTRAMUSCULAR | Status: DC
  Administered 2019-01-14: 100 mg via INTRAVENOUS
  Filled 2019-01-14 (×3): qty 2

## 2019-01-14 MED ORDER — LAMOTRIGINE 100 MG PO TABS
50.0000 mg | ORAL_TABLET | Freq: Two times a day (BID) | ORAL | Status: DC
  Administered 2019-01-15 – 2019-01-22 (×14): 50 mg via ORAL
  Filled 2019-01-14 (×16): qty 1

## 2019-01-14 MED ORDER — THIAMINE HCL 100 MG/ML IJ SOLN
Freq: Once | INTRAVENOUS | Status: AC
  Administered 2019-01-14: 19:00:00 via INTRAVENOUS
  Filled 2019-01-14: qty 1000

## 2019-01-14 NOTE — ED Triage Notes (Signed)
Pt here from home with c/o aloc and a fall , pt arrived to the ED confused yelling stop and c/o pain to right shoulder and left knee ,pt has multiple bruises over her entire body , pt lives alone

## 2019-01-14 NOTE — ED Notes (Signed)
Pt walking around room, gown off, stating she wants something to eat.  Ativan and fentanyl given approx 1 hour prior. MD notified.

## 2019-01-14 NOTE — H&P (Signed)
History and Physical    [name]:[id number] DOB: [birth year] DOA: 01/14/2019  PCP: Carren Rang, PA-C  Patient coming from: Home I have personally briefly reviewed patient's old medical records in [facility]  Chief Complaint: Status post fall and altered mental status.  HPI: Ariel White is a 58 y.o. female with medical history significant of bipolar disorder, depression/anxiety, tobacco abuse, alcohol abuse, GERD brought by EMS to emergency department due to concern of altered mental status.  Patient reports that she was trying to get out of the bed in the morning to go to bathroom, she fell and hit on head and passed out.  When she woke up she found herself in the vomitus.  She had to drag herself to the kitchen as her phone was on the kitchen table, she called 911 and brought to the emergency department for further evaluation and management.  Complaining of headache, right shoulder and left leg pain.  Denies headache, blurry vision, dizziness, lightheadedness, chest pain, shortness of breath, palpitation, leg swelling, nausea, belly pain, numbness weakness tingling sensation before the fall.  She has multiple bruises on her right eye, nose and both legs.  She reports that she smokes 5 cigarettes/day-trying to quit and drinks alcohol only on the weekends.  Denies illicit drug use.  She lives alone and independent of daily life activities does not use walker or cane for ambulation.  She denies previous history of alcohol withdrawal.  She has history of depression/anxiety-however she denies suicidal or homicidal thoughts.  She said that she drinks alcohol to help with sleep and her appetite.  ED Course: Patient was found to have tachycardic, tachypneic, blood pressure elevated.  CK level: 1792.  Ethanol: Undetectable, troponin x1-.  Lactic acid: WNL.  CBC/BMP: At baseline.  AST: Elevated.  Imaging such as chest x-ray, pelvic x-ray, left foot, left tibia-fibula, right humerus, left  femur, right shoulder, CT abdomen/pelvis, CT chest, CT maxillofacial, CT cervical spine and CT head all came back negative for acute findings.  Patient was given IV Ativan and IV fluids in the ED   review of Systems: As per HPI otherwise negative.    Past Medical History:  Diagnosis Date  . Anxiety   . Bipolar 1 disorder (HCC)   . Chronic pain   . Depression   . Eczema   . GERD (gastroesophageal reflux disease)   . Headache    migraines  . Seizures (HCC)     Past Surgical History:  Procedure Laterality Date  . ABDOMINAL HYSTERECTOMY    . APPENDECTOMY    . BRAIN TUMOR EXCISION  1985  . SHOULDER HEMI-ARTHROPLASTY Right 11/14/2018   Procedure: SHOULDER HEMI-ARTHROPLASTY;  Surgeon: Christena Flake, MD;  Location: [facility] ORS;  Service: Orthopedics;  Laterality: Right;  . TUBAL LIGATION       reports that she has been smoking cigarettes. She has been smoking about 0.25 packs per day. She has never used smokeless tobacco. She reports previous drug use. She reports that she does not drink alcohol.  Allergies  Allergen Reactions  . Compazine [Prochlorperazine Edisylate] Other (See Comments)    euphoria  . Ivp Dye [Iodinated Diagnostic Agents] Anaphylaxis    "stopped her heart"  . Baclofen Other (See Comments)    dizziness  . Imitrex [Sumatriptan] Swelling    "throat swelling"  . Voltaren [Diclofenac]     Family History  Problem Relation Age of Onset  . Breast cancer Mother  70's    Prior to Admission medications   Medication Sig Start Date End Date Taking? Authorizing Provider  acetaminophen (TYLENOL ARTHRITIS PAIN) 650 MG CR tablet Take 650 mg by mouth every 8 (eight) hours as needed for pain.    [provider]  Alum Hydroxide-Mag Carbonate (GAVISCON EXTRA STRENGTH) 160-105 MG CHEW Chew 1 tablet by mouth 2 (two) times daily as needed.    [provider]  aluminum hydroxide-magnesium carbonate (GAVISCON) 95-358 MG/15ML SUSP Take by mouth.    [provider]  aspirin EC 325 MG tablet Take 1 tablet (325 mg total) by mouth daily. 11/15/18   Lattie Corns, PA-C  diphenhydramine-acetaminophen (TYLENOL PM) 25-500 MG TABS tablet Take 1 tablet by mouth at bedtime as needed.    [provider]  ondansetron (ZOFRAN) 4 MG tablet Take 1 tablet (4 mg total) by mouth every 6 (six) hours as needed for nausea. 11/15/18   Lattie Corns, PA-C  oxyCODONE (OXY IR/ROXICODONE) 5 MG immediate release tablet Take 1-2 tablets (5-10 mg total) by mouth every 4 (four) hours as needed for moderate pain. 11/15/18   Lattie Corns, PA-C  pantoprazole (PROTONIX) 40 MG tablet Take 40 mg by mouth daily.    [provider]  traMADol (ULTRAM) 50 MG tablet Take 1 tablet (50 mg total) by mouth every 6 (six) hours as needed for moderate pain. 11/15/18   Lattie Corns, PA-C    Physical Exam: Vitals:   01/14/19 1530 01/14/19 1545 01/14/19 1600 01/14/19 1612  BP: (!) 148/83 (!) 142/79 (!) 144/84 (!) 144/84  Pulse:    (!) 111  Resp: 14 15 15    Temp:      TempSrc:      SpO2:        Constitutional: NAD, calm, comfortable Vitals:   01/14/19 1530 01/14/19 1545 01/14/19 1600 01/14/19 1612  BP: (!) 148/83 (!) 142/79 (!) 144/84 (!) 144/84  Pulse:    (!) 111  Resp: 14 15 15    Temp:      TempSrc:      SpO2:       Constitutional: Alert and oriented, not in acute distress,  eyes: PERRL, lids and conjunctivae normal ENMT: Mucous membranes are dry t. Posterior pharynx clear of any exudate or lesions.Normal dentition.  Neck: normal, supple, no masses, no thyromegaly Respiratory: clear to auscultation bilaterally, no wheezing, no crackles. Normal respiratory effort. No accessory muscle use.  Cardiovascular: Regular rate and rhythm, no murmurs / rubs / gallops. No extremity edema. 2+ pedal pulses. No carotid bruits.  Abdomen: no tenderness, no masses palpated. No hepatosplenomegaly. Bowel sounds positive.  Musculoskeletal: no clubbing /  cyanosis. No joint deformity upper and lower extremities. Good ROM, no contractures. Normal muscle tone.  Skin: Multiple bruises noted on right eyes, nose, both legs.   Neurologic: CN 2-12 grossly intact. Sensation intact, DTR normal. Strength 5/5 in all 4.  Psychiatric: Normal judgment and insight. Alert and oriented x 3.  Tearful Labs on Admission: I have personally reviewed following labs and imaging studies  CBC: Recent Labs  Lab 01/14/19 1344  WBC 8.4  HGB 14.0  HCT 40.4  MCV 111.3*  PLT 606*   Basic Metabolic Panel: Recent Labs  Lab 01/14/19 1350  NA 135  K 4.0  CL 102  CO2 16*  GLUCOSE 91  BUN 18  CREATININE 1.01*  CALCIUM 9.8   GFR: CrCl cannot be calculated (Unknown ideal weight.). Liver Function Tests: Recent Labs  Lab  01/14/19 1350  AST 60*  ALT 18  ALKPHOS 87  BILITOT 0.9  PROT 7.0  ALBUMIN 4.0   No results for input(s): LIPASE, AMYLASE in the last 168 hours. No results for input(s): AMMONIA in the last 168 hours. Coagulation Profile: No results for input(s): INR, PROTIME in the last 168 hours. Cardiac Enzymes: Recent Labs  Lab 01/14/19 1350  CKTOTAL 1,792*   BNP (last 3 results) No results for input(s): PROBNP in the last 8760 hours. HbA1C: No results for input(s): HGBA1C in the last 72 hours. CBG: No results for input(s): GLUCAP in the last 168 hours. Lipid Profile: No results for input(s): CHOL, HDL, LDLCALC, TRIG, CHOLHDL, LDLDIRECT in the last 72 hours. Thyroid Function Tests: No results for input(s): TSH, T4TOTAL, FREET4, T3FREE, THYROIDAB in the last 72 hours. Anemia Panel: No results for input(s): VITAMINB12, FOLATE, FERRITIN, TIBC, IRON, RETICCTPCT in the last 72 hours. Urine analysis:    Component Value Date/Time   COLORURINE YELLOW 11/07/2018 0900   APPEARANCEUR CLOUDY (A) 11/07/2018 0900   APPEARANCEUR Hazy 10/20/2013 1305   LABSPEC 1.025 11/07/2018 0900   LABSPEC 1.017 10/20/2013 1305   PHURINE 6.0 11/07/2018 0900    GLUCOSEU NEGATIVE 11/07/2018 0900   GLUCOSEU Negative 10/20/2013 1305   HGBUR NEGATIVE 11/07/2018 0900   BILIRUBINUR SMALL (A) 11/07/2018 0900   BILIRUBINUR Negative 10/20/2013 1305   KETONESUR 80 (A) 11/07/2018 0900   PROTEINUR 30 (A) 11/07/2018 0900   NITRITE POSITIVE (A) 11/07/2018 0900   LEUKOCYTESUR TRACE (A) 11/07/2018 0900   LEUKOCYTESUR Trace 10/20/2013 1305    Radiological Exams on Admission: Ct Abdomen Pelvis Wo Contrast  Result Date: 01/14/2019 CLINICAL DATA:  Fall EXAM: CT CHEST, ABDOMEN AND PELVIS WITHOUT CONTRAST TECHNIQUE: Multidetector CT imaging of the chest, abdomen and pelvis was performed following the standard protocol without IV contrast. COMPARISON:  Partial comparison to right shoulder radiographs dated 11/14/2018. FINDINGS: CT CHEST FINDINGS Cardiovascular: No evidence of traumatic aortic injury. Ectasia of the ascending thoracic aorta, measuring 3.5 cm. The heart is normal in size.  No pericardial effusion. Mediastinum/Nodes: No evidence of hematoma along the anterior mediastinum. No suspicious mediastinal lymphadenopathy. Visualized thyroid is unremarkable. Lungs/Pleura: Biapical pleural-parenchymal scarring. No suspicious pulmonary nodules. No focal consolidation. No pleural effusion or pneumothorax. Musculoskeletal: Thoracic spine is within normal limits. Right shoulder arthroplasty, without evidence of complication. Cortical buckling along the lateral right clavicle (series 3/image 5). However, when correlating with prior studies, this favors a chronic deformity. Old right posterior 10th and 11th rib fracture deformities. No acute fracture is seen. Specifically, the clavicles, sternum, scapulae, and bilateral ribs are intact. CT ABDOMEN PELVIS FINDINGS Motion degraded images Hepatobiliary: Unenhanced liver is unremarkable. No. Fat fluid/hemorrhage. Gallbladder is unremarkable. No intrahepatic or extrahepatic ductal dilatation. Pancreas: Within normal limits. Spleen:  Within normal limits.  No perisplenic fluid/hemorrhage. Adrenals/Urinary Tract: Adrenal glands are within normal limits. Punctate interpolar left renal calculus (series 3/image 70). Additional 5 mm nonobstructing right upper pole renal calculus (series 3/image 35). No hydronephrosis. Bladder is within normal limits. Stomach/Bowel: Stomach is within normal limits. No evidence of bowel obstruction. Prior appendectomy. Vascular/Lymphatic: No evidence of abdominal aortic aneurysm. No suspicious abdominopelvic lymphadenopathy. Reproductive: Uterus is unremarkable. Left ovary is within normal limits.  No right adnexal mass. Other: No abdominopelvic ascites. No hemoperitoneum or free air. Musculoskeletal: Mild degenerative changes at L2-3. No fracture is seen. IMPRESSION: Suspected chronic deformity involving the lateral right clavicle. Correlate for point tenderness to exclude acute on chronic injury. Otherwise, no evidence of acute  traumatic injury to the chest, abdomen, or pelvis. Old right posterior 10th and 11th rib fracture deformities. Electronically Signed   By: Charline BillsSriyesh  Krishnan M.D.   On: 01/14/2019 13:36   Dg Chest 1 View  Result Date: 01/14/2019 CLINICAL DATA:  Fall, pain, bruising EXAM: CHEST  1 VIEW COMPARISON:  01/01/2014 FINDINGS: Lungs are clear.  No pleural effusion or pneumothorax. The heart is normal in size. Right shoulder arthroplasty, incompletely visualized. Right lateral clavicle deformity noted on recent CT is excluded from this image, but a similar deformity was present in 2015. Multiple right rib deformities, including the right anterior 7th and 8th ribs, which were not evident on recent CT due to motion degradation. Right lateral 10th rib deformity is favored to be chronic on CT. IMPRESSION: Right anterior 7th and 8th rib deformities were not evident on recent CT due to motion degradation. Correlate for point tenderness. Right lateral 10th rib deformity is favored to be chronic when  correlating with recent CT. Right lateral clavicle deformity noted on recent CT is excluded from the current study, but a similar deformity was present on prior 2015 chest radiograph. Electronically Signed   By: Charline BillsSriyesh  Krishnan M.D.   On: 01/14/2019 14:07   Dg Pelvis 1-2 Views  Result Date: 01/14/2019 CLINICAL DATA:  Fall, pain, bruising EXAM: PELVIS - 1-2 VIEW COMPARISON:  None. FINDINGS: No fracture or dislocation is seen. Bowel hip joint spaces are preserved. Visualized bony pelvis appears intact. IMPRESSION: Negative. Electronically Signed   By: Charline BillsSriyesh  Krishnan M.D.   On: 01/14/2019 13:56   Dg Shoulder Right  Result Date: 01/14/2019 CLINICAL DATA:  Fall, pain EXAM: RIGHT SHOULDER - 2+ VIEW; RIGHT HUMERUS - 2+ VIEW COMPARISON:  11/26/2013 FINDINGS: Status post right shoulder humeral arthroplasty. No fracture or dislocation of the right shoulder. The acromioclavicular joint is intact. The partially imaged right chest is unremarkable. No fracture or dislocation of the distal right humerus. Soft tissues are normal. IMPRESSION: 1. Status post right shoulder humeral arthroplasty. No fracture or dislocation of the right shoulder. 2.  No fracture or dislocation of the distal right humerus. Electronically Signed   By: Lauralyn PrimesAlex  Bibbey M.D.   On: 01/14/2019 13:59   Dg Tibia/fibula Left  Result Date: 01/14/2019 CLINICAL DATA:  Fall, hip pain EXAM: LEFT TIBIA AND FIBULA - 2 VIEW COMPARISON:  None. FINDINGS: No fracture or dislocation is seen. The joint spaces are preserved. Visualized soft tissues are within normal limits. IMPRESSION: Negative. Electronically Signed   By: Charline BillsSriyesh  Krishnan M.D.   On: 01/14/2019 13:55   Ct Head Wo Contrast  Result Date: 01/14/2019 CLINICAL DATA:  Fall, AMS EXAM: CT HEAD WITHOUT CONTRAST CT MAXILLOFACIAL WITHOUT CONTRAST CT CERVICAL SPINE WITHOUT CONTRAST TECHNIQUE: Multidetector CT imaging of the head, cervical spine, and maxillofacial structures were performed using the  standard protocol without intravenous contrast. Multiplanar CT image reconstructions of the cervical spine and maxillofacial structures were also generated. COMPARISON:  08/07/2015, 07/11/2009 FINDINGS: CT HEAD FINDINGS Brain: No evidence of acute infarction, hemorrhage, hydrocephalus, extra-axial collection or mass lesion/mass effect. There are post resection findings of the cerebellum with overlying occipital craniotomy (series 3, image 12, series 6, image 31). Vascular: No hyperdense vessel or unexpected calcification. CT FACIAL BONES FINDINGS Skull: Redemonstrated postoperative findings of occipital craniotomy. Negative for fracture or focal lesion. Facial bones: No displaced fractures or dislocations. Sinuses/Orbits: No acute finding. Rightward deviation of the nasal septum with a large polyp or mass of the left nasopharynx (series 4, image 46, series  7, image 24). Although not completely imaged on prior examinations this is grossly unchanged in comparison to partial imaging on remote prior head CT dated 07/11/2009. Other: None. CT CERVICAL SPINE FINDINGS Alignment: Degenerative straightening and multilevel degenerative anterolisthesis. Skull base and vertebrae: No acute fracture. No primary bone lesion or focal pathologic process. Soft tissues and spinal canal: No prevertebral fluid or swelling. No visible canal hematoma. Disc levels:  Severe multilevel disc degenerative and osteophytosis. Upper chest: Negative. Other: None. IMPRESSION: 1.  No acute intracranial pathology. 2. Redemonstrated postoperative findings of cerebellar resection with overlying occipital craniotomy. 3.  No displaced fracture or dislocation of the facial bones. 4. 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 Although not completely imaged on prior examinations this is grossly unchanged in comparison to partial imaging on remote prior head CT dated  07/11/2009. 5. No fracture or static subluxation of the cervical spine. Severe multilevel disc degenerative disease. Electronically Signed   By: Lauralyn Primes M.D.   On: 01/14/2019 13:32   Ct Chest Wo Contrast  Result Date: 01/14/2019 CLINICAL DATA:  Fall EXAM: CT CHEST, ABDOMEN AND PELVIS WITHOUT CONTRAST TECHNIQUE: Multidetector CT imaging of the chest, abdomen and pelvis was performed following the standard protocol without IV contrast. COMPARISON:  Partial comparison to right shoulder radiographs dated 11/14/2018. FINDINGS: CT CHEST FINDINGS Cardiovascular: No evidence of traumatic aortic injury. Ectasia of the ascending thoracic aorta, measuring 3.5 cm. The heart is normal in size.  No pericardial effusion. Mediastinum/Nodes: No evidence of hematoma along the anterior mediastinum. No suspicious mediastinal lymphadenopathy. Visualized thyroid is unremarkable. Lungs/Pleura: Biapical pleural-parenchymal scarring. No suspicious pulmonary nodules. No focal consolidation. No pleural effusion or pneumothorax. Musculoskeletal: Thoracic spine is within normal limits. Right shoulder arthroplasty, without evidence of complication. Cortical buckling along the lateral right clavicle (series 3/image 5). However, when correlating with prior studies, this favors a chronic deformity. Old right posterior 10th and 11th rib fracture deformities. No acute fracture is seen. Specifically, the clavicles, sternum, scapulae, and bilateral ribs are intact. CT ABDOMEN PELVIS FINDINGS Motion degraded images Hepatobiliary: Unenhanced liver is unremarkable. No. Fat fluid/hemorrhage. Gallbladder is unremarkable. No intrahepatic or extrahepatic ductal dilatation. Pancreas: Within normal limits. Spleen: Within normal limits.  No perisplenic fluid/hemorrhage. Adrenals/Urinary Tract: Adrenal glands are within normal limits. Punctate interpolar left renal calculus (series 3/image 70). Additional 5 mm nonobstructing right upper pole renal  calculus (series 3/image 35). No hydronephrosis. Bladder is within normal limits. Stomach/Bowel: Stomach is within normal limits. No evidence of bowel obstruction. Prior appendectomy. Vascular/Lymphatic: No evidence of abdominal aortic aneurysm. No suspicious abdominopelvic lymphadenopathy. Reproductive: Uterus is unremarkable. Left ovary is within normal limits.  No right adnexal mass. Other: No abdominopelvic ascites. No hemoperitoneum or free air. Musculoskeletal: Mild degenerative changes at L2-3. No fracture is seen. IMPRESSION: Suspected chronic deformity involving the lateral right clavicle. Correlate for point tenderness to exclude acute on chronic injury. Otherwise, no evidence of acute traumatic injury to the chest, abdomen, or pelvis. Old right posterior 10th and 11th rib fracture deformities. Electronically Signed   By: Charline Bills M.D.   On: 01/14/2019 13:36   Ct Cervical Spine Wo Contrast  Result Date: 01/14/2019 CLINICAL DATA:  Fall, AMS EXAM: CT HEAD WITHOUT CONTRAST CT MAXILLOFACIAL WITHOUT CONTRAST CT CERVICAL SPINE WITHOUT CONTRAST TECHNIQUE: Multidetector CT imaging of the head, cervical spine, and maxillofacial structures were performed using the standard protocol without intravenous contrast. Multiplanar CT  image reconstructions of the cervical spine and maxillofacial structures were also generated. COMPARISON:  08/07/2015, 07/11/2009 FINDINGS: CT HEAD FINDINGS Brain: No evidence of acute infarction, hemorrhage, hydrocephalus, extra-axial collection or mass lesion/mass effect. There are post resection findings of the cerebellum with overlying occipital craniotomy (series 3, image 12, series 6, image 31). Vascular: No hyperdense vessel or unexpected calcification. CT FACIAL BONES FINDINGS Skull: Redemonstrated postoperative findings of occipital craniotomy. Negative for fracture or focal lesion. Facial bones: No displaced fractures or dislocations. Sinuses/Orbits: No acute finding.  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 Although not completely imaged on prior examinations this is grossly unchanged in comparison to partial imaging on remote prior head CT dated 07/11/2009. Other: None. CT CERVICAL SPINE FINDINGS Alignment: Degenerative straightening and multilevel degenerative anterolisthesis. Skull base and vertebrae: No acute fracture. No primary bone lesion or focal pathologic process. Soft tissues and spinal canal: No prevertebral fluid or swelling. No visible canal hematoma. Disc levels:  Severe multilevel disc degenerative and osteophytosis. Upper chest: Negative. Other: None. IMPRESSION: 1.  No acute intracranial pathology. 2. Redemonstrated postoperative findings of cerebellar resection with overlying occipital craniotomy. 3.  No displaced fracture or dislocation of the facial bones. 4. 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 Although not completely imaged on prior examinations this is grossly unchanged in comparison to partial imaging on remote prior head CT dated 07/11/2009. 5. No fracture or static subluxation of the cervical spine. Severe multilevel disc degenerative disease. Electronically Signed   By: Lauralyn Primes M.D.   On: 01/14/2019 13:32   Dg Humerus Right  Result Date: 01/14/2019 CLINICAL DATA:  Fall, pain EXAM: RIGHT SHOULDER - 2+ VIEW; RIGHT HUMERUS - 2+ VIEW COMPARISON:  11/26/2013 FINDINGS: Status post right shoulder humeral arthroplasty. No fracture or dislocation of the right shoulder. The acromioclavicular joint is intact. The partially imaged right chest is unremarkable. No fracture or dislocation of the distal right humerus. Soft tissues are normal. IMPRESSION: 1. Status post right shoulder humeral arthroplasty. No fracture or dislocation of the right shoulder. 2.  No fracture or  dislocation of the distal right humerus. Electronically Signed   By: Lauralyn Primes M.D.   On: 01/14/2019 13:59   Dg Foot Complete Left  Result Date: 01/14/2019 CLINICAL DATA:  Fall, pain EXAM: LEFT FOOT - COMPLETE 3+ VIEW COMPARISON:  None. FINDINGS: No fracture or dislocation is seen. The joint spaces are preserved. Visualized soft tissues are within normal limits. IMPRESSION: Negative. Electronically Signed   By: Charline Bills M.D.   On: 01/14/2019 13:55   Dg Femur Min 2 Views Left  Result Date: 01/14/2019 CLINICAL DATA:  Fall, hip pain EXAM: LEFT FEMUR 2 VIEWS COMPARISON:  None. FINDINGS: No fracture or dislocation is seen. The joint spaces are preserved. Visualized soft tissues are within normal limits. Visualized bony pelvis appears intact. IMPRESSION: Negative. Electronically Signed   By: Charline Bills M.D.   On: 01/14/2019 13:55   Ct Maxillofacial Wo Contrast  Result Date: 01/14/2019 CLINICAL DATA:  Fall, AMS EXAM: CT HEAD WITHOUT CONTRAST CT MAXILLOFACIAL WITHOUT CONTRAST CT CERVICAL SPINE WITHOUT CONTRAST TECHNIQUE: Multidetector CT imaging of the head, cervical spine, and maxillofacial structures were performed using the standard protocol without intravenous contrast. Multiplanar CT image reconstructions of the cervical spine and maxillofacial structures were also generated. COMPARISON:  08/07/2015, 07/11/2009 FINDINGS: CT HEAD FINDINGS Brain: No evidence of acute infarction, hemorrhage, hydrocephalus, extra-axial collection or mass lesion/mass effect. There are post resection findings of the cerebellum with overlying occipital craniotomy (series 3, image 12, series 6, image 31). Vascular: No hyperdense vessel or unexpected calcification. CT FACIAL BONES FINDINGS Skull: Redemonstrated postoperative findings of occipital craniotomy. Negative for fracture or focal lesion. Facial bones: No displaced fractures or dislocations. Sinuses/Orbits: No acute finding. Rightward deviation of the  nasal septum with a large polyp or mass of the left nasopharynx (series 4, image 46, series 7, image 24). Although not completely imaged on prior examinations this is grossly unchanged in comparison to partial imaging on remote prior head CT dated 07/11/2009. Other: None. CT CERVICAL SPINE FINDINGS Alignment: Degenerative straightening and multilevel degenerative anterolisthesis. Skull base and vertebrae: No acute fracture. No primary bone lesion or focal pathologic process. Soft tissues and spinal canal: No prevertebral fluid or swelling. No visible canal hematoma. Disc levels:  Severe multilevel disc degenerative and osteophytosis. Upper chest: Negative. Other: None. IMPRESSION: 1.  No acute intracranial pathology. 2. Redemonstrated postoperative findings of cerebellar resection with overlying occipital craniotomy. 3.  No displaced fracture or dislocation of the facial bones. 4. 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 Although not completely imaged on prior examinations this is grossly unchanged in comparison to partial imaging on remote prior head CT dated 07/11/2009. 5. No fracture or static subluxation of the cervical spine. Severe multilevel disc degenerative disease. Electronically Signed   By: Lauralyn Primes M.D.   On: 01/14/2019 13:32   Assessment/Plan Active Problems:   Depression, unspecified   Alcohol use disorder, mild, abuse   Altered mental state   Tobacco abuse   Anxiety   Rhabdomyolysis   Macrocytosis   Status post fall    Altered mental status: -Likely secondary to alcohol withdrawal, dehydration-patient has history of alcohol abuse. -s/p fall-2 days ago.  Reviewed all the imaging-no fracture or dislocation noted. -Imaging including CT head and cervical spine: Negative for acute findings -Ethanol level: Undetectable, lactic acid: WNL. -CK level elevated at 1792, patient is tachycardic,  tachypneic, blood pressure elevated upon arrival in the ED.  Troponin x1-. -Admit patient under observation.  On telemetry. -Check TSH, UDS, magnesium/phosphate level, continue IV fluids, monitor vitals. -UA is pending. -We will keep her n.p.o. until she passes bedside swallow evaluation, Zofran PRN for nausea and vomiting. -Consulted ST/OT/PT  Mild rhabdomyolysis: -CK level elevated at 1792.  Continue IV fluids -No signs of kidney injury.  Monitor kidney function closely. -Repeat CK tomorrow a.m.  Depression/anxiety: We will continue her home medicines. -Patient denies suicidal or homicidal thoughts.  GERD: Stable -Continue Protonix  Alcohol abuse: -Counseled regarding cessation. -Started on multivitamin, folic acid and thiamine. -Started on CIWA protocol -On fall/seizure precautions.  Monitor for DTs.  Macrocytosis: -Likely secondary to alcohol abuse. -Check TSH, folate and B12 level.  Tobacco abuse: -Counseled about cessation. -Started on nicotine patch  DVT prophylaxis: Lovenox Code Status: Full code Family Communication: None present at bedside.  Plan of care discussed with patient in length and he verbalized understanding and agreed with it. Disposition Plan: TBD Consults called: None Admission status: Observation  Ollen Bowl MD [facility] Pager [phone]  If 7PM-7AM, please contact night-coverage [email or web address] Password [facility]  01/14/2019, 4:58 PM

## 2019-01-14 NOTE — ED Notes (Signed)
Patient brother calling asking for an update  [phone]

## 2019-01-14 NOTE — ED Notes (Signed)
Patient transported to CT 

## 2019-01-14 NOTE — ED Provider Notes (Signed)
[facility] Emergency Department Provider Note MRN:  [id number]  Arrival date & time: 01/14/19     Chief Complaint   Fall and Altered Mental Status   History of Present Illness   Ariel White is a 58 y.o. year-old female with a history of bipolar disorder, chronic pain presenting to the ED with chief complaint of fall and altered mental status.  Reportedly fell 2 days ago and has been on the ground since that time.  Covered in bruises.  Pain to the right shoulder, left leg.  I was unable to obtain an accurate HPI, PMH, or ROS due to the patient's altered mental status.  Level 5 caveat.  Review of Systems  Positive for fall, altered mental status.  Patient's Health History    Past Medical History:  Diagnosis Date  . Anxiety   . Bipolar 1 disorder (HCC)   . Chronic pain   . Depression   . Eczema   . GERD (gastroesophageal reflux disease)   . Headache    migraines  . Seizures (HCC)     Past Surgical History:  Procedure Laterality Date  . ABDOMINAL HYSTERECTOMY    . APPENDECTOMY    . BRAIN TUMOR EXCISION  1985  . SHOULDER HEMI-ARTHROPLASTY Right 11/14/2018   Procedure: SHOULDER HEMI-ARTHROPLASTY;  Surgeon: Christena FlakePoggi, John J, MD;  Location: [facility] ORS;  Service: Orthopedics;  Laterality: Right;  . TUBAL LIGATION      Family History  Problem Relation Age of Onset  . Breast cancer Mother        1870's    Social History   Socioeconomic History  . Marital status: Divorced    Spouse name: Not on file  . Number of children: Not on file  . Years of education: Not on file  . Highest education level: Not on file  Occupational History  . Not on file  Social Needs  . Financial resource strain: Not on file  . Food insecurity    Worry: Not on file    Inability: Not on file  . Transportation needs    Medical: Not on file    Non-medical: Not on file  Tobacco Use  . Smoking status: Current Every Day Smoker    Packs/day: 0.25    Types: Cigarettes  .  Smokeless tobacco: Never Used  Substance and Sexual Activity  . Alcohol use: No    Alcohol/week: 0.0 standard drinks  . Drug use: Not Currently    Comment: 3 TO 5 YEARS  . Sexual activity: Not on file  Lifestyle  . Physical activity    Days per week: Not on file    Minutes per session: Not on file  . Stress: Not on file  Relationships  . Social Musicianconnections    Talks on phone: Not on file    Gets together: Not on file    Attends religious service: Not on file    Active member of club or organization: Not on file    Attends meetings of clubs or organizations: Not on file    Relationship status: Not on file  . Intimate partner violence    Fear of current or ex partner: Not on file    Emotionally abused: Not on file    Physically abused: Not on file    Forced sexual activity: Not on file  Other Topics Concern  . Not on file  Social History Narrative  . Not on file     Physical Exam  Vital Signs  and Nursing Notes reviewed Vitals:   01/14/19 1415 01/14/19 1430  BP: (!) 151/69 (!) 149/76  Pulse: (!) 105 (!) 110  Resp: 15 14  Temp:    SpO2: 98% 100%    CONSTITUTIONAL: Chronically ill-appearing, in moderate distress due to pain NEURO:  Alert and oriented x 3, no focal deficits EYES:  eyes equal and reactive ENT/NECK:  no LAD, no JVD CARDIO: Tachycardic rate, well-perfused, normal S1 and S2 PULM:  CTAB no wheezing or rhonchi GI/GU:  normal bowel sounds, non-distended, non-tender MSK/SPINE:  No gross deformities, no edema SKIN: Scattered bruising to the face PSYCH:  Appropriate speech and behavior  Diagnostic and Interventional Summary    EKG Interpretation  Date/Time:    Ventricular Rate:    PR Interval:    QRS Duration:   QT Interval:    QTC Calculation:   R Axis:     Text Interpretation:        Labs Reviewed  CBC - Abnormal; Notable for the following components:      Result Value   RBC 3.63 (*)    MCV 111.3 (*)    MCH 38.6 (*)    Platelets 419 (*)     All other components within normal limits  ACETAMINOPHEN LEVEL - Abnormal; Notable for the following components:   Acetaminophen (Tylenol), Serum <10 (*)    All other components within normal limits  CK - Abnormal; Notable for the following components:   Total CK 1,792 (*)    All other components within normal limits  COMPREHENSIVE METABOLIC PANEL - Abnormal; Notable for the following components:   CO2 16 (*)    Creatinine, Ser 1.01 (*)    AST 60 (*)    Anion gap 17 (*)    All other components within normal limits  SARS CORONAVIRUS 2 (TAT 6-24 HRS)  LACTIC ACID, PLASMA  SALICYLATE LEVEL  ETHANOL  URINALYSIS, ROUTINE W REFLEX MICROSCOPIC  TROPONIN I (HIGH SENSITIVITY)  TROPONIN I (HIGH SENSITIVITY)    DG Shoulder Right  Final Result    DG Femur Min 2 Views Left  Final Result    DG Humerus Right  Final Result    DG Tibia/Fibula Left  Final Result    DG Foot Complete Left  Final Result    DG Pelvis 1-2 Views  Final Result    DG Chest 1 View  Final Result    CT HEAD WO CONTRAST  Final Result    CT CERVICAL SPINE WO CONTRAST  Final Result    CT MAXILLOFACIAL WO CONTRAST  Final Result    CT Chest Wo Contrast  Final Result    CT ABDOMEN PELVIS WO CONTRAST  Final Result      Medications  sodium chloride 0.9 % bolus 1,000 mL (1,000 mLs Intravenous New Bag/Given 01/14/19 1356)  LORazepam (ATIVAN) injection 1 mg (1 mg Intravenous Given 01/14/19 1356)     Procedures Critical Care  ED Course and Medical Decision Making  I have reviewed the triage vital signs and the nursing notes.  Pertinent labs & imaging results that were available during my care of the patient were reviewed by me and considered in my medical decision making (see below for details).  Altered mental status, fall, diffuse bruising, diffuse pain, patient is tachycardic, chart suggesting alcohol abuse history.  Given IV Ativan and fluids to treat possible withdrawal.  Patient's imaging is largely  unremarkable, labs reveal mild rhabdomyolysis, will admit to medicine service.  Elmer Sow. Pilar Plate, MD [facility] [place] [email or web address]  Final Clinical Impressions(s) / ED Diagnoses     ICD-10-CM   1. Traumatic rhabdomyolysis, initial encounter ([place])  T79.6XXA   2. Fall  W19.Merril Abbe DG Pelvis 1-2 Views    DG Pelvis 1-2 Views    DG Chest 1 View    DG Chest 1 View    CANCELED: DG Chest Port 1 View    CANCELED: DG Chest Port 1 View  3. Altered mental status, unspecified altered mental status type  R41.82     ED Discharge Orders    None      Discharge Instructions Discussed with and Provided to Patient: Discharge Instructions   None       Maudie Flakes, MD 01/14/19 1541

## 2019-01-15 ENCOUNTER — Inpatient Hospital Stay (HOSPITAL_COMMUNITY): Payer: Medicare HMO

## 2019-01-15 ENCOUNTER — Ambulatory Visit (HOSPITAL_COMMUNITY): Payer: Medicare HMO

## 2019-01-15 DIAGNOSIS — M25511 Pain in right shoulder: Secondary | ICD-10-CM | POA: Diagnosis present

## 2019-01-15 DIAGNOSIS — R296 Repeated falls: Secondary | ICD-10-CM | POA: Diagnosis present

## 2019-01-15 DIAGNOSIS — G9341 Metabolic encephalopathy: Secondary | ICD-10-CM | POA: Diagnosis present

## 2019-01-15 DIAGNOSIS — E860 Dehydration: Secondary | ICD-10-CM | POA: Diagnosis present

## 2019-01-15 DIAGNOSIS — F1010 Alcohol abuse, uncomplicated: Secondary | ICD-10-CM | POA: Diagnosis present

## 2019-01-15 DIAGNOSIS — W1830XA Fall on same level, unspecified, initial encounter: Secondary | ICD-10-CM | POA: Diagnosis present

## 2019-01-15 DIAGNOSIS — M6282 Rhabdomyolysis: Secondary | ICD-10-CM | POA: Diagnosis present

## 2019-01-15 DIAGNOSIS — S8011XA Contusion of right lower leg, initial encounter: Secondary | ICD-10-CM | POA: Diagnosis present

## 2019-01-15 DIAGNOSIS — F319 Bipolar disorder, unspecified: Secondary | ICD-10-CM | POA: Diagnosis present

## 2019-01-15 DIAGNOSIS — R4182 Altered mental status, unspecified: Secondary | ICD-10-CM | POA: Diagnosis not present

## 2019-01-15 DIAGNOSIS — I361 Nonrheumatic tricuspid (valve) insufficiency: Secondary | ICD-10-CM | POA: Diagnosis not present

## 2019-01-15 DIAGNOSIS — S0033XA Contusion of nose, initial encounter: Secondary | ICD-10-CM | POA: Diagnosis present

## 2019-01-15 DIAGNOSIS — E876 Hypokalemia: Secondary | ICD-10-CM | POA: Diagnosis not present

## 2019-01-15 DIAGNOSIS — E538 Deficiency of other specified B group vitamins: Secondary | ICD-10-CM | POA: Diagnosis present

## 2019-01-15 DIAGNOSIS — R55 Syncope and collapse: Secondary | ICD-10-CM | POA: Diagnosis present

## 2019-01-15 DIAGNOSIS — F23 Brief psychotic disorder: Secondary | ICD-10-CM | POA: Diagnosis not present

## 2019-01-15 DIAGNOSIS — K219 Gastro-esophageal reflux disease without esophagitis: Secondary | ICD-10-CM | POA: Diagnosis present

## 2019-01-15 DIAGNOSIS — S0011XA Contusion of right eyelid and periocular area, initial encounter: Secondary | ICD-10-CM | POA: Diagnosis present

## 2019-01-15 DIAGNOSIS — R51 Headache: Secondary | ICD-10-CM | POA: Diagnosis present

## 2019-01-15 DIAGNOSIS — Y92009 Unspecified place in unspecified non-institutional (private) residence as the place of occurrence of the external cause: Secondary | ICD-10-CM | POA: Diagnosis not present

## 2019-01-15 DIAGNOSIS — L309 Dermatitis, unspecified: Secondary | ICD-10-CM | POA: Diagnosis present

## 2019-01-15 DIAGNOSIS — F419 Anxiety disorder, unspecified: Secondary | ICD-10-CM | POA: Diagnosis present

## 2019-01-15 DIAGNOSIS — F1094 Alcohol use, unspecified with alcohol-induced mood disorder: Secondary | ICD-10-CM | POA: Diagnosis not present

## 2019-01-15 DIAGNOSIS — S8012XA Contusion of left lower leg, initial encounter: Secondary | ICD-10-CM | POA: Diagnosis present

## 2019-01-15 DIAGNOSIS — F17210 Nicotine dependence, cigarettes, uncomplicated: Secondary | ICD-10-CM | POA: Diagnosis present

## 2019-01-15 DIAGNOSIS — D7589 Other specified diseases of blood and blood-forming organs: Secondary | ICD-10-CM | POA: Diagnosis present

## 2019-01-15 DIAGNOSIS — M79605 Pain in left leg: Secondary | ICD-10-CM | POA: Diagnosis present

## 2019-01-15 DIAGNOSIS — G8929 Other chronic pain: Secondary | ICD-10-CM | POA: Diagnosis present

## 2019-01-15 DIAGNOSIS — T796XXA Traumatic ischemia of muscle, initial encounter: Secondary | ICD-10-CM | POA: Diagnosis not present

## 2019-01-15 DIAGNOSIS — F339 Major depressive disorder, recurrent, unspecified: Secondary | ICD-10-CM | POA: Diagnosis not present

## 2019-01-15 DIAGNOSIS — Z9851 Tubal ligation status: Secondary | ICD-10-CM | POA: Diagnosis not present

## 2019-01-15 DIAGNOSIS — F320 Major depressive disorder, single episode, mild: Secondary | ICD-10-CM | POA: Diagnosis not present

## 2019-01-15 DIAGNOSIS — F05 Delirium due to known physiological condition: Secondary | ICD-10-CM | POA: Diagnosis not present

## 2019-01-15 DIAGNOSIS — Z20828 Contact with and (suspected) exposure to other viral communicable diseases: Secondary | ICD-10-CM | POA: Diagnosis present

## 2019-01-15 DIAGNOSIS — R410 Disorientation, unspecified: Secondary | ICD-10-CM | POA: Diagnosis not present

## 2019-01-15 DIAGNOSIS — R451 Restlessness and agitation: Secondary | ICD-10-CM | POA: Diagnosis not present

## 2019-01-15 LAB — URINALYSIS, ROUTINE W REFLEX MICROSCOPIC
Bilirubin Urine: NEGATIVE
Glucose, UA: NEGATIVE mg/dL
Hgb urine dipstick: NEGATIVE
Ketones, ur: NEGATIVE mg/dL
Nitrite: NEGATIVE
Protein, ur: NEGATIVE mg/dL
Specific Gravity, Urine: 1.009 (ref 1.005–1.030)
pH: 6 (ref 5.0–8.0)

## 2019-01-15 LAB — RAPID URINE DRUG SCREEN, HOSP PERFORMED
Amphetamines: NOT DETECTED
Barbiturates: NOT DETECTED
Benzodiazepines: POSITIVE — AB
Cocaine: NOT DETECTED
Opiates: POSITIVE — AB
Tetrahydrocannabinol: NOT DETECTED

## 2019-01-15 LAB — HEPATITIS PANEL, ACUTE
HCV Ab: <0.1 s/co ratio (ref 0.0–0.9)
Hep A IgM: NEGATIVE
Hep B C IgM: NEGATIVE
Hepatitis B Surface Ag: NEGATIVE

## 2019-01-15 LAB — FOLATE RBC
Folate, Hemolysate: 253 ng/mL
Folate, RBC: 729 ng/mL (ref >498)
Hematocrit: 34.7 % (ref 34.0–46.6)

## 2019-01-15 LAB — ECHOCARDIOGRAM COMPLETE
Height: 63 in
Weight: 1873.03 oz

## 2019-01-15 LAB — CBC
HCT: 35 % — ABNORMAL LOW (ref 36.0–46.0)
Hemoglobin: 11.4 g/dL — ABNORMAL LOW (ref 12.0–15.0)
MCH: 37.7 pg — ABNORMAL HIGH (ref 26.0–34.0)
MCHC: 32.6 g/dL (ref 30.0–36.0)
MCV: 115.9 fL — ABNORMAL HIGH (ref 80.0–100.0)
Platelets: 349 10*3/uL (ref 150–400)
RBC: 3.02 MIL/uL — ABNORMAL LOW (ref 3.87–5.11)
RDW: 13.2 % (ref 11.5–15.5)
WBC: 6.5 10*3/uL (ref 4.0–10.5)
nRBC: 0 % (ref 0.0–0.2)

## 2019-01-15 LAB — COMPREHENSIVE METABOLIC PANEL
ALT: 15 U/L (ref 0–44)
AST: 45 U/L — ABNORMAL HIGH (ref 15–41)
Albumin: 3 g/dL — ABNORMAL LOW (ref 3.5–5.0)
Alkaline Phosphatase: 71 U/L (ref 38–126)
Anion gap: 9 (ref 5–15)
BUN: 10 mg/dL (ref 6–20)
CO2: 17 mmol/L — ABNORMAL LOW (ref 22–32)
Calcium: 7.7 mg/dL — ABNORMAL LOW (ref 8.9–10.3)
Chloride: 112 mmol/L — ABNORMAL HIGH (ref 98–111)
Creatinine, Ser: 0.77 mg/dL (ref 0.44–1.00)
GFR calc Af Amer: >60 mL/min (ref >60)
GFR calc non Af Amer: >60 mL/min (ref >60)
Glucose, Bld: 113 mg/dL — ABNORMAL HIGH (ref 70–99)
Potassium: 3.5 mmol/L (ref 3.5–5.1)
Sodium: 138 mmol/L (ref 135–145)
Total Bilirubin: 0.3 mg/dL (ref 0.3–1.2)
Total Protein: 5.5 g/dL — ABNORMAL LOW (ref 6.5–8.1)

## 2019-01-15 LAB — HIV ANTIBODY (ROUTINE TESTING W REFLEX): HIV Screen 4th Generation wRfx: NONREACTIVE

## 2019-01-15 LAB — PROTIME-INR
INR: 1 (ref 0.8–1.2)
Prothrombin Time: 13.3 seconds (ref 11.4–15.2)

## 2019-01-15 LAB — CK: Total CK: 1021 U/L — ABNORMAL HIGH (ref 38–234)

## 2019-01-15 MED ORDER — VITAMIN B-12 1000 MCG PO TABS
1000.0000 ug | ORAL_TABLET | Freq: Every day | ORAL | Status: DC
  Administered 2019-01-16 – 2019-01-22 (×7): 1000 ug via ORAL
  Filled 2019-01-15 (×8): qty 1

## 2019-01-15 MED ORDER — MORPHINE SULFATE (PF) 2 MG/ML IV SOLN
2.0000 mg | INTRAVENOUS | Status: AC | PRN
  Administered 2019-01-15 – 2019-01-16 (×2): 2 mg via INTRAVENOUS
  Filled 2019-01-15 (×2): qty 1

## 2019-01-15 MED ORDER — HYDROCODONE-ACETAMINOPHEN 5-325 MG PO TABS
1.0000 | ORAL_TABLET | ORAL | Status: DC | PRN
  Administered 2019-01-15: 2 via ORAL
  Filled 2019-01-15: qty 2

## 2019-01-15 MED ORDER — HYDROCODONE-ACETAMINOPHEN 5-325 MG PO TABS
1.0000 | ORAL_TABLET | Freq: Four times a day (QID) | ORAL | Status: DC | PRN
  Administered 2019-01-15: 1 via ORAL
  Administered 2019-01-15: 2 via ORAL
  Filled 2019-01-15: qty 2
  Filled 2019-01-15: qty 1

## 2019-01-15 MED ORDER — HYDROCODONE-ACETAMINOPHEN 5-325 MG PO TABS
1.0000 | ORAL_TABLET | ORAL | Status: DC | PRN
  Administered 2019-01-16 – 2019-01-17 (×8): 1 via ORAL
  Filled 2019-01-15 (×9): qty 1

## 2019-01-15 MED ORDER — ACETAMINOPHEN 325 MG PO TABS
650.0000 mg | ORAL_TABLET | Freq: Four times a day (QID) | ORAL | Status: DC | PRN
  Administered 2019-01-15 – 2019-01-22 (×4): 650 mg via ORAL
  Filled 2019-01-15 (×4): qty 2

## 2019-01-15 MED ORDER — MAGNESIUM SULFATE 2 GM/50ML IV SOLN
2.0000 g | Freq: Once | INTRAVENOUS | Status: AC
  Administered 2019-01-15: 2 g via INTRAVENOUS
  Filled 2019-01-15: qty 50

## 2019-01-15 MED ORDER — MORPHINE SULFATE (PF) 2 MG/ML IV SOLN
2.0000 mg | Freq: Once | INTRAVENOUS | Status: AC
  Administered 2019-01-15: 2 mg via INTRAVENOUS
  Filled 2019-01-15: qty 1

## 2019-01-15 NOTE — Plan of Care (Signed)

## 2019-01-15 NOTE — Progress Notes (Signed)
Pt extremely disruptive at start of shift and refused vital signs.

## 2019-01-15 NOTE — Progress Notes (Signed)
PT Cancellation Note  Patient Details Name: Ariel White MRN: [id number] DOB: 1960-08-19   Cancelled Treatment:    Reason Eval/Treat Not Completed: Other (comment) patient with RN and agitated, RN requests PT return later in day. Will attempt to return if time/schedule allow.    Deniece Ree PT, DPT, CBIS  Supplemental Physical Therapist [facility]    Pager [phone] Acute Rehab Office [phone]

## 2019-01-15 NOTE — Progress Notes (Signed)
PROGRESS NOTE  Ariel White LYY:[id number] DOB: [birth year] DOA: 01/14/2019 PCP: Ariel Rang, PA-C  HPI/Recap of past 24 hours: HPI from Dr Pervis Hocking Ariel White is a 58 y.o. female with medical history significant of bipolar disorder, depression/anxiety, tobacco abuse, alcohol abuse, GERD brought by EMS to emergency department due to concern of altered mental status.  Patient reports that she was trying to get out of the bed in the morning to go to bathroom, she fell and hit on head and passed out.  When she woke up she found herself in the vomitus.  She had to drag herself to the kitchen as her phone was on the kitchen table, she called 911 and brought to the emergency department for further evaluation and management.  Complaining of headache, right shoulder and left leg pain. She has multiple bruises on her right eye, nose and both legs. She reports that she smokes 5 cigarettes/day-trying to quit and drinks alcohol only on the weekends (although reports drinks to help her sleep). Denies illicit drug use.  She lives alone and independent of daily life activities does not use walker or cane for ambulation.  She denies previous history of alcohol withdrawal. In the ED, CK level: 1792.  Ethanol: Undetectable, troponin x1-.  Lactic acid: WNL. Imaging such as chest x-ray, pelvic x-ray, left foot, left tibia-fibula, right humerus, left femur, right shoulder, CT abdomen/pelvis, CT chest, CT maxillofacial, CT cervical spine and CT head all came back negative for acute findings.    Today, patient reports significant pain around her right shoulder, left leg, and a headache.  Patient denies any other new complaints.  Patient lives alone at home, PT/OT recommending SNF.  I agree with recommendation as patient cannot be safely discharged home   Assessment/Plan: Active Problems:   Depression, unspecified   Alcohol use disorder, mild, abuse   Altered mental state   Tobacco abuse   Anxiety    Rhabdomyolysis   Macrocytosis   Status post fall   Acute metabolic encephalopathy Improved, mentation back to baseline Unknown etiology, likely related to alcohol abuse/withdrawal History of frequent falls Alcohol level undetectable, lactic acid within normal limits Troponin negative, EKG pending CK elevated, will trend UA negative EEG pending TSH WNL UDS positive for benzo, opiates (likely 2/2 meds given here in the hospital) PT/OT/SLP-recommended SNF IV fluids, pain management Fall precautions  Rhabdomyolysis CK elevated, will trend Daily CK  Hypomagnesemia Replace PRN  Vitamin B12 deficiency/macrocytic anemia Start cyanocobalamin, folic acid  GERD Continue Protonix  Alcohol abuse Advised to quit CIWA protocol  Tobacco abuse Advised to quit Nicotine patch offered  Depression/anxiety Continue home medications           Malnutrition Type:      Malnutrition Characteristics:      Nutrition Interventions:       Estimated body mass index is 20.74 kg/m as calculated from the following:   Height as of this encounter: 5\' 3"  (1.6 m).   Weight as of this encounter: 53.1 kg.     Code Status: Full  Family Communication: None at bedside  Disposition Plan: SNF   Consultants:  None  Procedures:  None  Antimicrobials:  None  DVT prophylaxis: Lovenox   Objective: Vitals:   01/15/19 0001 01/15/19 0427 01/15/19 0753 01/15/19 1150  BP: (!) 142/71 (!) 147/73 (!) 162/73 (!) 145/82  Pulse: 97 87 84 99  Resp: 20  16 20   Temp: 97.8 F (36.6 C) 97.9 F (36.6 C) 98.6 F (37  C) (!) 97.5 F (36.4 C)  TempSrc: Axillary Oral Oral Oral  SpO2: 100% 98% 98% 100%  Weight: 53.1 kg     Height: 5\' 3"  (1.6 m)       Intake/Output Summary (Last 24 hours) at 01/15/2019 1438 Last data filed at 01/15/2019 0428 Gross per 24 hour  Intake 1000 ml  Output 300 ml  Net 700 ml   Filed Weights   01/15/19 0001  Weight: 53.1 kg     Exam:  General: NAD   Cardiovascular: S1, S2 present  Respiratory: CTAB  Abdomen: Soft, nontender, nondistended, bowel sounds present  Musculoskeletal: No bilateral pedal edema noted  Skin:  Multiple bruises noted around face, both legs  Psychiatry: Normal mood    Data Reviewed: CBC: Recent Labs  Lab 01/14/19 1344 01/14/19 1648 01/15/19 0509  WBC 8.4 7.4 6.5  HGB 14.0 11.8* 11.4*  HCT 40.4 35.4* 35.0*  MCV 111.3* 114.9* 115.9*  PLT 419* 379 161   Basic Metabolic Panel: Recent Labs  Lab 01/14/19 1350 01/14/19 1648 01/15/19 0509  NA 135  --  138  K 4.0  --  3.5  CL 102  --  112*  CO2 16*  --  17*  GLUCOSE 91  --  113*  BUN 18  --  10  CREATININE 1.01* 0.82 0.77  CALCIUM 9.8  --  7.7*  MG  --  1.5*  --   PHOS  --  3.0  --    GFR: Estimated Creatinine Clearance: 64.2 mL/min (by C-G formula based on SCr of 0.77 mg/dL). Liver Function Tests: Recent Labs  Lab 01/14/19 1350 01/15/19 0509  AST 60* 45*  ALT 18 15  ALKPHOS 87 71  BILITOT 0.9 0.3  PROT 7.0 5.5*  ALBUMIN 4.0 3.0*   No results for input(s): LIPASE, AMYLASE in the last 168 hours. No results for input(s): AMMONIA in the last 168 hours. Coagulation Profile: Recent Labs  Lab 01/15/19 0509  INR 1.0   Cardiac Enzymes: Recent Labs  Lab 01/14/19 1350 01/15/19 0509  CKTOTAL 1,792* 1,021*   BNP (last 3 results) No results for input(s): PROBNP in the last 8760 hours. HbA1C: No results for input(s): HGBA1C in the last 72 hours. CBG: No results for input(s): GLUCAP in the last 168 hours. Lipid Profile: No results for input(s): CHOL, HDL, LDLCALC, TRIG, CHOLHDL, LDLDIRECT in the last 72 hours. Thyroid Function Tests: Recent Labs    01/14/19 1648  TSH 2.082   Anemia Panel: Recent Labs    01/14/19 1648  VITAMINB12 118*   Urine analysis:    Component Value Date/Time   COLORURINE YELLOW 01/15/2019 [place] 01/15/2019 1211   APPEARANCEUR Hazy 10/20/2013 1305    LABSPEC 1.009 01/15/2019 1211   LABSPEC 1.017 10/20/2013 1305   PHURINE 6.0 01/15/2019 1211   GLUCOSEU NEGATIVE 01/15/2019 1211   GLUCOSEU Negative 10/20/2013 1305   HGBUR NEGATIVE 01/15/2019 1211   BILIRUBINUR NEGATIVE 01/15/2019 1211   BILIRUBINUR Negative 10/20/2013 1305   [place] 01/15/2019 1211   PROTEINUR NEGATIVE 01/15/2019 1211   NITRITE NEGATIVE 01/15/2019 1211   LEUKOCYTESUR TRACE (A) 01/15/2019 1211   LEUKOCYTESUR Trace 10/20/2013 1305   Sepsis Labs: @LABRCNTIP (procalcitonin:4,lacticidven:4)  ) Recent Results (from the past 240 hour(s))  SARS CORONAVIRUS 2 (TAT 6-24 HRS) Nasopharyngeal Nasopharyngeal Swab     Status: None   Collection Time: 01/14/19  4:48 PM   Specimen: Nasopharyngeal Swab  Result Value Ref Range Status   SARS Coronavirus 2 NEGATIVE  NEGATIVE Final    Comment: (NOTE) SARS-CoV-2 target nucleic acids are NOT DETECTED. The SARS-CoV-2 RNA is generally detectable in upper and lower respiratory specimens during the acute phase of infection. Negative results do not preclude SARS-CoV-2 infection, do not rule out co-infections with other pathogens, and should not be used as the sole basis for treatment or other patient management decisions. Negative results must be combined with clinical observations, patient history, and epidemiological information. The expected result is Negative. Fact Sheet for Patients: HairSlick.no Fact Sheet for Healthcare Providers: quierodirigir.com This test is not yet approved or cleared by the [place] FDA and  has been authorized for detection and/or diagnosis of SARS-CoV-2 by FDA under an Emergency Use Authorization (EUA). This EUA will remain  in effect (meaning this test can be used) for the duration of the COVID-19 declaration under Section 56 4(b)(1) of the Act, 21 U.S.C. section 360bbb-3(b)(1), unless the authorization is terminated or revoked  sooner. Performed at [facility], [address] [place], [place] [address]       Studies: No results found.  Scheduled Meds: . enoxaparin (LOVENOX) injection  40 mg Subcutaneous Q24H  . fentaNYL (SUBLIMAZE) injection  50 mcg Intravenous Once  . folic acid  1 mg Oral Daily  . lamoTRIgine  50 mg Oral BID  . LORazepam  0-4 mg Intravenous Q6H   Or  . LORazepam  0-4 mg Oral Q6H  . [START ON 01/17/2019] LORazepam  0-4 mg Intravenous Q12H   Or  . [START ON 01/17/2019] LORazepam  0-4 mg Oral Q12H  . multivitamin with minerals  1 tablet Oral Daily  . nicotine  14 mg Transdermal Daily  . pantoprazole  40 mg Oral Daily  . thiamine  100 mg Oral Daily   Or  . thiamine  100 mg Intravenous Daily    Continuous Infusions: . sodium chloride 100 mL/hr at 01/15/19 1430     LOS: 0 days     Briant Cedar, MD [facility]  If 7PM-7AM, please contact night-coverage [email or web address] 01/15/2019, 2:38 PM

## 2019-01-15 NOTE — Evaluation (Signed)
Clinical/Bedside Swallow Evaluation Patient Details  Name: Ariel White MRN: [id number] Date of Birth: [birth year]  Today's Date: 01/15/2019 Time: SLP Start Time (ACUTE ONLY): 79 SLP Stop Time (ACUTE ONLY): 1030 SLP Time Calculation (min) (ACUTE ONLY): 25 min  Past Medical History:  Past Medical History:  Diagnosis Date  . Anxiety   . Bipolar 1 disorder ([place])   . Chronic pain   . Depression   . Eczema   . GERD (gastroesophageal reflux disease)   . Headache    migraines  . Seizures ([place])    Past Surgical History:  Past Surgical History:  Procedure Laterality Date  . ABDOMINAL HYSTERECTOMY    . APPENDECTOMY    . BRAIN TUMOR EXCISION  1985  . SHOULDER HEMI-ARTHROPLASTY Right 11/14/2018   Procedure: SHOULDER HEMI-ARTHROPLASTY;  Surgeon: Corky Mull, MD;  Location: [facility] ORS;  Service: Orthopedics;  Laterality: Right;  . TUBAL LIGATION     HPI:  58 yo female admtted 9//20/2020 after a fall and AMS. PMHL BiPolar D/O, depression/anxiety, tobacco/ETOH abuse, GERD, Migraines, seizures, chronic pain. Head CT = No acute intracranial pathology. Redemonstrated postoperative findings of cerebellar resection with overlying occipital craniotomy.   Assessment / Plan / Recommendation Clinical Impression  Pt seen at bedside for assessment of swallow function and safety. RN reports no difficulty with po intake or meds. Pt was awake, reclining in bed upon arrival of SLP. CN exam unremarkable. Pt has poor and missing dentition, but reports tolerance of regular solids and thin liquids prior to admit. No obvious oral difficulty or overt s/s aspiration on thin liquid or puree trials. Pt declined solid trials at this time. Recommend continuing with current diet. No further ST intervention recommended at this time. Please reconsult if needs arise.   SLP Visit Diagnosis: Dysphagia, unspecified (R13.10)    Aspiration Risk  Mild aspiration risk    Diet Recommendation Dysphagia 3 (Mech soft); Thin liquid    Liquid Administration via: Cup;Straw Medication Administration: Whole meds with liquid Compensations: Slow rate;Small sips/bites Postural Changes: Seated upright at 90 degrees;Remain upright for at least 30 minutes after po intake    Other  Recommendations Oral Care Recommendations: Oral care BID       Prognosis  good     Swallow Study   General Date of Onset: 01/14/19 HPI: 58 yo female admtted 9//20/2020 after a fall and AMS. PMHL BiPolar D/O, depression/anxiety, tobacco/ETOH abuse, GERD, Migraines, seizures, chronic pain. Head CT = No acute intracranial pathology. Redemonstrated postoperative findings of cerebellar resection with overlying occipital craniotomy. Previous Swallow Assessment: none Diet Prior to this Study: Dysphagia 3 (soft);Thin liquids Temperature Spikes Noted: No Respiratory Status: Room air History of Recent Intubation: No Behavior/Cognition: Alert;Cooperative Oral Cavity Assessment: Within Functional Limits Oral Care Completed by SLP: No Oral Cavity - Dentition: Poor condition;Missing dentition Vision: Functional for self-feeding Self-Feeding Abilities: Able to feed self Patient Positioning: Upright in bed Baseline Vocal Quality: Normal Volitional Cough: Strong Volitional Swallow: Able to elicit    Oral/Motor/Sensory Function Overall Oral Motor/Sensory Function: Within functional limits   Ice Chips Ice chips: Not tested   Thin Liquid Thin Liquid: Within functional limits Presentation: Straw    Nectar Thick Nectar Thick Liquid: Not tested   Honey Thick Honey Thick Liquid: Not tested   Puree Puree: Within functional limits Presentation: Self Fed;Spoon   Solid     Other Comments: Pt declined presentation of solid texture, but reports no difficulty. RN confirms no difficulty     Celia B. Bueche, MSP,  CCC-SLP Speech Language Pathologist [phone]  Leigh AuroraBueche, Celia Brown 01/15/2019,10:36 AM

## 2019-01-15 NOTE — Progress Notes (Signed)
Arrived to perform EEG pt refuses due to pain. RN notified.

## 2019-01-15 NOTE — Procedures (Signed)
Patient Name: Ariel White  MRN: [id number]  Epilepsy Attending: Lora Havens  Referring Physician/Provider: Dr Lesia Sago Date: 01/15/2019 Duration: 23.41 mins  Patient history: 58yo F with ams. EEG to evaluate for seizure  Level of alertness:   AEDs during EEG study: LTG, ativan  Technical aspects: This EEG study was done with scalp electrodes positioned according to the 10-20 International system of electrode placement. Electrical activity was acquired at a sampling rate of 500Hz  and reviewed with a high frequency filter of 70Hz  and a low frequency filter of 1Hz . EEG data were recorded continuously and digitally stored.   DESCRIPTION: The posterior dominant rhythm consists of 8-9 Hz activity of moderate voltage (25-35 uV) seen predominantly in posterior head regions, symmetric and reactive to eye opening and eye closing.   There is an excessive amount of 15 to 18 Hz, 2-3 uV beta activity with irregular morphology distributed symmetrically and diffusely. There is also 4-6Hz  theta slowing in left frontotemporal region. Physiologic photic driving was seen during photic stimulation. Hyperventilation was not performed.  IMPRESSION: This study is suggestive of cortical dysfunction in left frontotemporal region, non specific to etiology. No seizures or definite epileptiform discharges were seen throughout the recording.  The excessive beta activity seen in the background is most likely due to the effect of benzodiazepine and is a benign EEG pattern.  [name] [name]

## 2019-01-15 NOTE — Evaluation (Signed)
Physical Therapy Evaluation Patient Details Name: Ariel White MRN: [id number] DOB: Feb 17, 1961 Today's Date: 01/15/2019   History of Present Illness  58 y.o. female presenting with AMS as well as fall in bathroom and hit her head. CT of head showing no acute abnormalities. PMH including bipolar disorder, depression/anxiety, tobacco abuse, alcohol abuse, R shoulder hemiarthroplasty, and GERD.  Clinical Impression   Patient received transferring to [facility] with nurse tech, less agitated this morning but very tearful and often stating "lord why did all of this happen to me???" during session, very pain limited today as well. Able to perform functional transfers with min guard-MinA, then sit to supine with Min guard today. She was positioned to comfort with all needs met, bed alarm active. Gait deferred at eval due to severe pain. She will continue to benefit from skilled PT services in the acute setting, also recommend continued services in the SNF setting and 24/7 S/physical assist moving forward especially due to history of repeated major falls.     Follow Up Recommendations SNF;Supervision/Assistance - 24 hour    Equipment Recommendations  Rolling walker with 5" wheels    Recommendations for Other Services       Precautions / Restrictions Precautions Precautions: Fall Restrictions Weight Bearing Restrictions: Yes LLE Weight Bearing: Weight bearing as tolerated      Mobility  Bed Mobility Overal bed mobility: Needs Assistance Bed Mobility: Sit to Supine     Supine to sit: Min assist Sit to supine: Min guard   General bed mobility comments: min guard for sit to supine, extended time  Transfers Overall transfer level: Needs assistance Equipment used: Rolling walker (2 wheeled) Transfers: Sit to/from [facility] Sit to Stand: Min guard Stand pivot transfers: Min assist Squat pivot transfers: Min assist     General transfer comment: min guard for sit to stand,  minA for balance for stand-pivot back to bed  Ambulation/Gait         Gait velocity: decreased   General Gait Details: pivotal steps during transfers, otherwise gait limited by pain  Stairs            Wheelchair Mobility    Modified Rankin (Stroke Patients Only)       Balance Overall balance assessment: Needs assistance Sitting-balance support: Bilateral upper extremity supported;Feet supported Sitting balance-Leahy Scale: Good     Standing balance support: Bilateral upper extremity supported;During functional activity Standing balance-Leahy Scale: Poor Standing balance comment: Reliant on UE support or physical A                             Pertinent Vitals/Pain Pain Assessment: Faces Pain Score: 10-Worst pain ever Faces Pain Scale: Hurts whole lot Pain Location: generalized pain Pain Descriptors / Indicators: Discomfort;Aching Pain Intervention(s): Limited activity within patient's tolerance;Monitored during session;Repositioned    Home Living Family/patient expects to be discharged to:: Private residence Living Arrangements: Alone Available Help at Discharge: Family;Available PRN/intermittently Type of Home: Mobile home Home Access: Stairs to enter Entrance Stairs-Rails: Can reach both Entrance Stairs-Number of Steps: 4 Home Layout: One level Home Equipment: Cane - single point;Cane - quad;Hand held shower head;Bedside commode      Prior Function Level of Independence: Needs assistance   Gait / Transfers Assistance Needed: Not using DME for mobility prior to admission  ADL's / Homemaking Assistance Needed: Pt reports she was performing all IADLs and ADLs prior to her last admission. Since last admission, she was having  meals delivered and was not driving.  Comments: able to perform IADLs with independence     Hand Dominance   Dominant Hand: Right    Extremity/Trunk Assessment   Upper Extremity Assessment Upper Extremity  Assessment: Generalized weakness RUE Deficits / Details: Prior shoulder sx. Pt reporting limited ROM at shoulder. WFL grasp strength.  RUE Coordination: decreased gross motor    Lower Extremity Assessment Lower Extremity Assessment: Generalized weakness    Cervical / Trunk Assessment Cervical / Trunk Assessment: Kyphotic  Communication   Communication: No difficulties  Cognition Arousal/Alertness: Awake/alert Behavior During Therapy: WFL for tasks assessed/performed Overall Cognitive Status: Impaired/Different from baseline Area of Impairment: Problem solving                             Problem Solving: Slow processing;Requires verbal cues General Comments: perseverating on pain      General Comments General comments (skin integrity, edema, etc.): glasses broken on L side, also noted multiple bruises on L UE about the size of fingerprints, patient unable to answer where they came from    Exercises     Assessment/Plan    PT Assessment Patient needs continued PT services  PT Problem List Decreased strength;Decreased mobility;Decreased safety awareness;Decreased coordination;Decreased activity tolerance;Decreased balance;Pain;Decreased knowledge of use of DME       PT Treatment Interventions DME instruction;Therapeutic activities;Gait training;Therapeutic exercise;Patient/family education;Stair training;Balance training;Functional mobility training;Neuromuscular re-education    PT Goals (Current goals can be found in the Care Plan section)  Acute Rehab PT Goals Patient Stated Goal: "Stop all this pain and get better" PT Goal Formulation: With patient Time For Goal Achievement: 01/29/19 Potential to Achieve Goals: Fair    Frequency Min 2X/week   Barriers to discharge        Co-evaluation               AM-PAC PT "6 Clicks" Mobility  Outcome Measure Help needed turning from your back to your side while in a flat bed without using bedrails?: A  Little Help needed moving from lying on your back to sitting on the side of a flat bed without using bedrails?: A Little Help needed moving to and from a bed to a chair (including a wheelchair)?: A Little Help needed standing up from a chair using your arms (e.g., wheelchair or bedside chair)?: A Little Help needed to walk in hospital room?: A Lot Help needed climbing 3-5 steps with a railing? : A Lot 6 Click Score: 16    End of Session   Activity Tolerance: Patient limited by pain Patient left: in bed;with call bell/phone within reach;with bed alarm set   PT Visit Diagnosis: Unsteadiness on feet (R26.81);Repeated falls (R29.6);Difficulty in walking, not elsewhere classified (R26.2);Pain;Muscle weakness (generalized) (M62.81) Pain - Right/Left: (generalized) Pain - part of body: (generalized)    Time: 1610-9604 PT Time Calculation (min) (ACUTE ONLY): 20 min   Charges:   PT Evaluation $PT Eval Moderate Complexity: 1 Mod          Deniece Ree PT, DPT, CBIS  Supplemental Physical Therapist [place]    Pager [phone] Acute Rehab Office [phone]

## 2019-01-15 NOTE — Progress Notes (Signed)
EEG complete - results pending 

## 2019-01-15 NOTE — Plan of Care (Signed)
Patient stable, discussed POC with patient, c/o uncontrolled pain, MD notified, no new orders received.

## 2019-01-15 NOTE — Progress Notes (Signed)
  Echocardiogram 2D Echocardiogram has been performed.  Jennette Dubin 01/15/2019, 2:27 PM

## 2019-01-15 NOTE — Evaluation (Signed)
Occupational Therapy Evaluation Patient Details Name: Ariel White MRN: [id number] DOB: 04/23/1961 Today's Date: 01/15/2019    History of Present Illness 58 y.o. female presenting with AMS as well as fall in bathroom and hit her head. CT of head showing no acute abnormalities. PMH including bipolar disorder, depression/anxiety, tobacco abuse, alcohol abuse, R shoulder hemiarthroplasty, and GERD.   Clinical Impression   PTA, pt was living alone and was performing all ADLs and IADLs prior to recent admission and shoulder sx; since last admission, pt has required assistance for IADLs. Pt currently requires Min A for UB ADLs, Mod-Max A for LB ADLs, and Min A for functional transfers with RW. Pt presenting with decreased strength, balance, and activity tolerance due to significant pain at LLE and right shoulder. Pt with poor weight bearing tolerance through LLE due to pain. Pt would benefit from further acute OT to facilitate safe dc. Recommend dc to SNF for further OT to optimize safety, independence with ADLs, and return to PLOF.      Follow Up Recommendations  SNF;Supervision/Assistance - 24 hour    Equipment Recommendations  Other (comment)(Defer to next venue)    Recommendations for Other Services PT consult     Precautions / Restrictions Precautions Precautions: Fall Restrictions Weight Bearing Restrictions: Yes LLE Weight Bearing: Weight bearing as tolerated      Mobility Bed Mobility Overal bed mobility: Needs Assistance Bed Mobility: Supine to Sit     Supine to sit: Min assist     General bed mobility comments: Pt performing bed mobility to bring BLEs towards EOB and elevate trunk. Reaching out for therapist's hand to pull and bring hips towards EOB  Transfers Overall transfer level: Needs assistance Equipment used: Rolling walker (2 wheeled) Transfers: Sit to/from Office manager Transfers Sit to Stand: Min assist Stand pivot transfers: Min  assist Squat pivot transfers: Min assist     General transfer comment: Pt requiring Min A for initial squat pivot to [facility] demonstrating poor tolerance with increased pain. Min A for power up and to gain balance in standing and then pivot to recliner with use of RW. Pt with poor weight bearing tolerance through LLE impacting her safety and balance    Balance Overall balance assessment: Needs assistance Sitting-balance support: No upper extremity supported;Feet supported Sitting balance-Leahy Scale: Fair     Standing balance support: Bilateral upper extremity supported;During functional activity;No upper extremity supported Standing balance-Leahy Scale: Poor Standing balance comment: Reliant on UE support or physical A                           ADL either performed or assessed with clinical judgement   ADL Overall ADL's : Needs assistance/impaired Eating/Feeding: Set up;Sitting   Grooming: Oral care;Set up;Sitting;Wash/dry hands;Wash/dry face   Upper Body Bathing: Minimal assistance;Sitting   Lower Body Bathing: Minimal assistance;Sit to/from stand   Upper Body Dressing : Minimal assistance;Sitting   Lower Body Dressing: Moderate assistance;Sit to/from stand;Maximal assistance Lower Body Dressing Details (indicate cue type and reason): Max A for donning socks due to pain. Feel pt would require Mod A for other LB clothing Toilet Transfer: Minimal assistance;Stand-pivot;RW;BSC Toilet Transfer Details (indicate cue type and reason): Min A for balance and safety. Pt performing first stand pivot with Min A and then second stand pivot with Min A and RW. Poor weight bearing through LLE due to pain Toileting- Clothing Manipulation and Hygiene: Set up;Supervision/safety;Sitting/lateral lean Toileting - Clothing Manipulation Details (  indicate cue type and reason): Pt performing peri care after urination     Functional mobility during ADLs: Minimal assistance;Rolling walker(stand  pivot only) General ADL Comments: Pt presenting with decreased balance, strength, and activity tolerance due to pain.      Vision Baseline Vision/History: Wears glasses Wears Glasses: At all times Patient Visual Report: No change from baseline       Perception     Praxis      Pertinent Vitals/Pain Pain Assessment: 0-10 Pain Score: 10-Worst pain ever Pain Location: right shoulder, left leg Pain Descriptors / Indicators: Aching Pain Intervention(s): Monitored during session;Repositioned;Limited activity within patient's tolerance;Patient requesting pain meds-RN notified     Hand Dominance Right   Extremity/Trunk Assessment Upper Extremity Assessment Upper Extremity Assessment: RUE deficits/detail RUE Deficits / Details: Prior shoulder sx. Pt reporting limited ROM at shoulder. WFL grasp strength.  RUE Coordination: decreased gross motor   Lower Extremity Assessment Lower Extremity Assessment: Defer to PT evaluation   Cervical / Trunk Assessment Cervical / Trunk Assessment: Kyphotic   Communication Communication Communication: No difficulties   Cognition Arousal/Alertness: Awake/alert Behavior During Therapy: WFL for tasks assessed/performed Overall Cognitive Status: Impaired/Different from baseline Area of Impairment: Problem solving                             Problem Solving: Slow processing;Requires verbal cues General Comments: Pt requiring increased time and cues. Pt focused on pain and benefited from calm encouragement   General Comments  Noting that pt's glasses are broken on left side    Exercises     Shoulder Instructions      Home Living Family/patient expects to be discharged to:: Private residence Living Arrangements: Alone Available Help at Discharge: Family;Available PRN/intermittently Type of Home: Mobile home Home Access: Stairs to enter Entrance Stairs-Number of Steps: 4 Entrance Stairs-Rails: Can reach both Home Layout: One  level     Bathroom Shower/Tub: Teacher, early years/pre: Standard     Home Equipment: Cane - single point;Cane - quad;Hand held shower head;Bedside commode          Prior Functioning/Environment Level of Independence: Needs assistance  Gait / Transfers Assistance Needed: Not using DME for mobility prior to admission ADL's / Homemaking Assistance Needed: Pt reports she was performing all IADLs and ADLs prior to her last admission. Since last admission, she was having meals delivered and was not driving.            OT Problem List: Decreased strength;Decreased range of motion;Decreased activity tolerance;Impaired balance (sitting and/or standing);Decreased knowledge of use of DME or AE;Decreased knowledge of precautions;Pain      OT Treatment/Interventions: Self-care/ADL training;Therapeutic exercise;Energy conservation;DME and/or AE instruction;Therapeutic activities;Patient/family education    OT Goals(Current goals can be found in the care plan section) Acute Rehab OT Goals Patient Stated Goal: "Stop all this pain and get better" OT Goal Formulation: With patient Time For Goal Achievement: 01/29/19 Potential to Achieve Goals: Good  OT Frequency: Min 2X/week   Barriers to D/C: Decreased caregiver support  Pt lives alone       Co-evaluation              AM-PAC OT "6 Clicks" Daily Activity     Outcome Measure Help from another person eating meals?: None Help from another person taking care of personal grooming?: A Little Help from another person toileting, which includes using toliet, bedpan, or urinal?: A Little Help from another person  bathing (including washing, rinsing, drying)?: A Little Help from another person to put on and taking off regular upper body clothing?: A Little Help from another person to put on and taking off regular lower body clothing?: A Lot 6 Click Score: 18   End of Session Equipment Utilized During Treatment: Rolling  walker Nurse Communication: Mobility status;Patient requests pain meds  Activity Tolerance: Patient limited by pain Patient left: in chair;with call bell/phone within reach;with chair alarm set  OT Visit Diagnosis: Unsteadiness on feet (R26.81);Other abnormalities of gait and mobility (R26.89);Muscle weakness (generalized) (M62.81);Pain Pain - Right/Left: Left Pain - part of body: Leg                Time: [id number] OT Time Calculation (min): 26 min Charges:  OT General Charges $OT Visit: 1 Visit OT Evaluation $OT Eval Moderate Complexity: 1 Mod OT Treatments $Self Care/Home Management : 8-22 mins  Kei Langhorst MSOT, OTR/L Acute Rehab Pager: [phone] Office: [phone]  Theodoro GristCharis M Marlowe Lawes 01/15/2019, 10:25 AM

## 2019-01-16 DIAGNOSIS — F23 Brief psychotic disorder: Secondary | ICD-10-CM

## 2019-01-16 DIAGNOSIS — R451 Restlessness and agitation: Secondary | ICD-10-CM

## 2019-01-16 DIAGNOSIS — F1094 Alcohol use, unspecified with alcohol-induced mood disorder: Secondary | ICD-10-CM

## 2019-01-16 DIAGNOSIS — F05 Delirium due to known physiological condition: Secondary | ICD-10-CM

## 2019-01-16 DIAGNOSIS — F339 Major depressive disorder, recurrent, unspecified: Secondary | ICD-10-CM

## 2019-01-16 LAB — BASIC METABOLIC PANEL
Anion gap: 8 (ref 5–15)
BUN: 6 mg/dL (ref 6–20)
CO2: 23 mmol/L (ref 22–32)
Calcium: 8.7 mg/dL — ABNORMAL LOW (ref 8.9–10.3)
Chloride: 107 mmol/L (ref 98–111)
Creatinine, Ser: 0.75 mg/dL (ref 0.44–1.00)
GFR calc Af Amer: >60 mL/min (ref >60)
GFR calc non Af Amer: >60 mL/min (ref >60)
Glucose, Bld: 131 mg/dL — ABNORMAL HIGH (ref 70–99)
Potassium: 3.5 mmol/L (ref 3.5–5.1)
Sodium: 138 mmol/L (ref 135–145)

## 2019-01-16 LAB — CBC WITH DIFFERENTIAL/PLATELET
Abs Immature Granulocytes: 0.02 10*3/uL (ref 0.00–0.07)
Basophils Absolute: 0.1 10*3/uL (ref 0.0–0.1)
Basophils Relative: 1 %
Eosinophils Absolute: 0.2 10*3/uL (ref 0.0–0.5)
Eosinophils Relative: 2 %
HCT: 37.1 % (ref 36.0–46.0)
Hemoglobin: 12.3 g/dL (ref 12.0–15.0)
Immature Granulocytes: 0 %
Lymphocytes Relative: 15 %
Lymphs Abs: 1.2 10*3/uL (ref 0.7–4.0)
MCH: 37.8 pg — ABNORMAL HIGH (ref 26.0–34.0)
MCHC: 33.2 g/dL (ref 30.0–36.0)
MCV: 114.2 fL — ABNORMAL HIGH (ref 80.0–100.0)
Monocytes Absolute: 0.4 10*3/uL (ref 0.1–1.0)
Monocytes Relative: 5 %
Neutro Abs: 6.1 10*3/uL (ref 1.7–7.7)
Neutrophils Relative %: 77 %
Platelets: 400 10*3/uL (ref 150–400)
RBC: 3.25 MIL/uL — ABNORMAL LOW (ref 3.87–5.11)
RDW: 12.7 % (ref 11.5–15.5)
WBC: 8 10*3/uL (ref 4.0–10.5)
nRBC: 0 % (ref 0.0–0.2)

## 2019-01-16 LAB — CK: Total CK: 1762 U/L — ABNORMAL HIGH (ref 38–234)

## 2019-01-16 LAB — MAGNESIUM: Magnesium: 2.3 mg/dL (ref 1.7–2.4)

## 2019-01-16 NOTE — Consult Note (Signed)
Telepsych Consultation   Reason for Consult:  "acute exacerbation of psychosis" Referring Physician:  Dr. Dorcas Carrow  Location of Patient: [id number] Location of Provider: [facility]  Patient Identification: Ariel White MRN:  [id number] Principal Diagnosis: Altered mental state Diagnosis:  Active Problems:   Depression, unspecified   Alcohol use disorder, mild, abuse   Altered mental state   Tobacco abuse   Anxiety   Rhabdomyolysis   Macrocytosis   Status post fall   Syncope   Total Time spent with patient: 1 hour  Subjective:   Ariel White is a 58 y.o. female patient admitted with altered mental status.  HPI:   Per chart review, patient was admitted with altered mental status. Her hospital course has been complicated by rhabdomyolysis and acute metabolic encephalopathy. She reports getting out of bed in the morning to use the bathroom and falling. She reports losing consciousness and waking up in her vomitus. She has a history of alcohol abuse. BAL was negative and UDS was positive for benzodiazepines and opiates. Home medications include Lamictal 50 mg BID. PMP indicates that he was last prescribed Oxycodone 5 mg tablets (#20) on 9/5 by Dr. Derald Macleod.  Of note, patient was last seen by psychiatry on 01/2015 for Tylenol PM ingestion in the setting of alcohol use. She was psychiatrically cleared with a plan to follow up with psychiatry and psychotherapy as an outpatient.   On interview, Ariel White reports, "I'm not doing too well. I'm in pain. I fell beside my bed." She reports that she had a right shoulder replacement in August. She reports that she has not been able to do much therapy since her surgery. She denies having frequent falls. She reports being independent of her ADLs at baseline. She admits to recent confusion and reports forgetting that she had a bedpan overnight. She reports that her nurse yelled at her after she removed her bedpan and had an  episode of urinary incontinence. She felt like she was "indecently exposed" when she was cleaned. She was previously prescribed Lamictal for depression. She has not taken it for several years but reports using a one time dose for insomnia a few nights ago. She denies a history of manic symptoms. She denies SI, HI or AVH.   Past Psychiatric History: Bipolar disorder, depression, anxiety, opiate abuse and alcohol abuse.   Risk to Self:  None. Denies SI.  Risk to Others:  None. Denies HI.  Prior Inpatient Therapy:  She was hospitalized years ago at [facility] for opiate detox.  Prior Outpatient Therapy:  She is followed by her PCP.    Past Medical History:  Past Medical History:  Diagnosis Date  . Anxiety   . Bipolar 1 disorder (HCC)   . Chronic pain   . Depression   . Eczema   . GERD (gastroesophageal reflux disease)   . Headache    migraines  . Seizures (HCC)     Past Surgical History:  Procedure Laterality Date  . ABDOMINAL HYSTERECTOMY    . APPENDECTOMY    . BRAIN TUMOR EXCISION  1985  . SHOULDER HEMI-ARTHROPLASTY Right 11/14/2018   Procedure: SHOULDER HEMI-ARTHROPLASTY;  Surgeon: Christena Flake, MD;  Location: [facility] ORS;  Service: Orthopedics;  Laterality: Right;  . TUBAL LIGATION     Family History:  Family History  Problem Relation Age of Onset  . Breast cancer Mother        42's   Family Psychiatric  History: Denies  Social  History:  Social History   Substance and Sexual Activity  Alcohol Use No  . Alcohol/week: 0.0 standard drinks     Social History   Substance and Sexual Activity  Drug Use Not Currently   Comment: 3 TO 5 YEARS    Social History   Socioeconomic History  . Marital status: Divorced    Spouse name: Not on file  . Number of children: Not on file  . Years of education: Not on file  . Highest education level: Not on file  Occupational History  . Not on file  Social Needs  . Financial resource strain: Not on file  . Food insecurity     Worry: Not on file    Inability: Not on file  . Transportation needs    Medical: Not on file    Non-medical: Not on file  Tobacco Use  . Smoking status: Current Every Day Smoker    Packs/day: 0.25    Types: Cigarettes  . Smokeless tobacco: Never Used  Substance and Sexual Activity  . Alcohol use: No    Alcohol/week: 0.0 standard drinks  . Drug use: Not Currently    Comment: 3 TO 5 YEARS  . Sexual activity: Not on file  Lifestyle  . Physical activity    Days per week: Not on file    Minutes per session: Not on file  . Stress: Not on file  Relationships  . Social Musician on phone: Not on file    Gets together: Not on file    Attends religious service: Not on file    Active member of club or organization: Not on file    Attends meetings of clubs or organizations: Not on file    Relationship status: Not on file  Other Topics Concern  . Not on file  Social History Narrative  . Not on file   Additional Social History: She lives alone. She denies alcohol or illicit substance use.     Allergies:   Allergies  Allergen Reactions  . Compazine [Prochlorperazine Edisylate] Other (See Comments)    euphoria  . Ivp Dye [Iodinated Diagnostic Agents] Anaphylaxis    "stopped her heart"  . Baclofen Other (See Comments)    dizziness  . Imitrex [Sumatriptan] Swelling    "throat swelling"  . Voltaren [Diclofenac] Other (See Comments)    Cannot hold onto objects (drops things)    Labs:  Results for orders placed or performed during the hospital encounter of 01/14/19 (from the past 48 hour(s))  Troponin I (High Sensitivity)     Status: None   Collection Time: 01/14/19  4:48 PM  Result Value Ref Range   Troponin I (High Sensitivity) 9 <18 ng/L    Comment: (NOTE) Elevated high sensitivity troponin I (hsTnI) values and significant  changes across serial measurements may suggest ACS but many other  chronic and acute conditions are known to elevate hsTnI results.  Refer  to the "Links" section for chest pain algorithms and additional  guidance. Performed at [facility], [address] [place], [place] [address]   SARS CORONAVIRUS 2 (TAT 6-24 HRS) Nasopharyngeal Nasopharyngeal Swab     Status: None   Collection Time: 01/14/19  4:48 PM   Specimen: Nasopharyngeal Swab  Result Value Ref Range   SARS Coronavirus 2 NEGATIVE NEGATIVE    Comment: (NOTE) SARS-CoV-2 target nucleic acids are NOT DETECTED. The SARS-CoV-2 RNA is generally detectable in upper and lower respiratory specimens during the acute phase  of infection. Negative results do not preclude SARS-CoV-2 infection, do not rule out co-infections with other pathogens, and should not be used as the sole basis for treatment or other patient management decisions. Negative results must be combined with clinical observations, patient history, and epidemiological information. The expected result is Negative. Fact Sheet for Patients: SugarRoll.be Fact Sheet for Healthcare Providers: https://www.woods-mathews.com/ This test is not yet approved or cleared by the [place] FDA and  has been authorized for detection and/or diagnosis of SARS-CoV-2 by FDA under an Emergency Use Authorization (EUA). This EUA will remain  in effect (meaning this test can be used) for the duration of the COVID-19 declaration under Section 56 4(b)(1) of the Act, 21 U.S.C. section 360bbb-3(b)(1), unless the authorization is terminated or revoked sooner. Performed at [facility], [place] [address], [place], [place] [address]   HIV antibody (Routine Testing)     Status: None   Collection Time: 01/14/19  4:48 PM  Result Value Ref Range   HIV Screen 4th Generation wRfx Non Reactive Non Reactive    Comment: (NOTE) Performed At: [facility] [place], [place] [id number] Rush Farmer MD AO:[id number]   CBC     Status: Abnormal   Collection Time:  01/14/19  4:48 PM  Result Value Ref Range   WBC 7.4 4.0 - 10.5 K/uL   RBC 3.08 (L) 3.87 - 5.11 MIL/uL   Hemoglobin 11.8 (L) 12.0 - 15.0 g/dL   HCT 35.4 (L) 36.0 - 46.0 %   MCV 114.9 (H) 80.0 - 100.0 fL   MCH 38.3 (H) 26.0 - 34.0 pg   MCHC 33.3 30.0 - 36.0 g/dL   RDW 13.2 11.5 - 15.5 %   Platelets 379 150 - 400 K/uL   nRBC 0.0 0.0 - 0.2 %    Comment: Performed at [facility], [place] [address] [place], [place] [address]  Creatinine, serum     Status: None   Collection Time: 01/14/19  4:48 PM  Result Value Ref Range   Creatinine, Ser 0.82 0.44 - 1.00 mg/dL   GFR calc non Af Amer >60 >60 mL/min   GFR calc Af Amer >60 >60 mL/min    Comment: Performed at [facility] [address], [place], [place] [address]  Magnesium     Status: Abnormal   Collection Time: 01/14/19  4:48 PM  Result Value Ref Range   Magnesium 1.5 (L) 1.7 - 2.4 mg/dL    Comment: Performed at [facility] [address] [place], [place] [address]  Phosphorus     Status: None   Collection Time: 01/14/19  4:48 PM  Result Value Ref Range   Phosphorus 3.0 2.5 - 4.6 mg/dL    Comment: Performed at [facility] [address] [place], [place] [address]  Vitamin B12     Status: Abnormal   Collection Time: 01/14/19  4:48 PM  Result Value Ref Range   Vitamin B-12 118 (L) 180 - 914 pg/mL    Comment: (NOTE) This assay is not validated for testing neonatal or myeloproliferative syndrome specimens for Vitamin B12 levels. Performed at [facility], [place] [address] [place], [place] [address]   Hepatitis panel, acute     Status: None   Collection Time: 01/14/19  4:48 PM  Result Value Ref Range   Hepatitis B Surface Ag Negative Negative   HCV Ab <0.1 0.0 - 0.9 s/co ratio    Comment: (NOTE)  Negative:     < 0.8                             Indeterminate: 0.8 - 0.9                                  Positive:     > 0.9 The CDC recommends that a positive HCV  antibody result be followed up with a HCV Nucleic Acid Amplification test (454098). Performed At: [facility] [address] [place], [place] [id number] Jolene Schimke MD FA:[id number]    Hep A IgM Negative Negative   Hep B C IgM Negative Negative  TSH     Status: None   Collection Time: 01/14/19  4:48 PM  Result Value Ref Range   TSH 2.082 0.350 - 4.500 uIU/mL    Comment: Performed by a 3rd Generation assay with a functional sensitivity of <=0.01 uIU/mL. Performed at [facility], [address] [place], [place] [address]   Folate RBC     Status: None   Collection Time: 01/14/19  4:49 PM  Result Value Ref Range   Folate, Hemolysate 253.0 Not Estab. ng/mL   Hematocrit 34.7 34.0 - 46.6 %   Folate, RBC 729 >498 ng/mL    Comment: (NOTE) Performed At: [facility] [address] [place], [place] [id number] Jolene Schimke MD WN:[id number]   Comprehensive metabolic panel     Status: Abnormal   Collection Time: 01/15/19  5:09 AM  Result Value Ref Range   Sodium 138 135 - 145 mmol/L   Potassium 3.5 3.5 - 5.1 mmol/L   Chloride 112 (H) 98 - 111 mmol/L   CO2 17 (L) 22 - 32 mmol/L   Glucose, Bld 113 (H) 70 - 99 mg/dL   BUN 10 6 - 20 mg/dL   Creatinine, Ser 0.34 0.44 - 1.00 mg/dL   Calcium 7.7 (L) 8.9 - 10.3 mg/dL    Comment: DELTA CHECK NOTED   Total Protein 5.5 (L) 6.5 - 8.1 g/dL   Albumin 3.0 (L) 3.5 - 5.0 g/dL   AST 45 (H) 15 - 41 U/L   ALT 15 0 - 44 U/L   Alkaline Phosphatase 71 38 - 126 U/L   Total Bilirubin 0.3 0.3 - 1.2 mg/dL   GFR calc non Af Amer >60 >60 mL/min   GFR calc Af Amer >60 >60 mL/min   Anion gap 9 5 - 15    Comment: Performed at [facility], [address] [place], [place] [address]  CBC     Status: Abnormal   Collection Time: 01/15/19  5:09 AM  Result Value Ref Range   WBC 6.5 4.0 - 10.5 K/uL   RBC 3.02 (L) 3.87 - 5.11 MIL/uL   Hemoglobin 11.4 (L) 12.0 - 15.0 g/dL   HCT 56.3 (L) 87.5 - 64.3 %   MCV 115.9 (H) 80.0 - 100.0  fL   MCH 37.7 (H) 26.0 - 34.0 pg   MCHC 32.6 30.0 - 36.0 g/dL   RDW 32.9 51.8 - 84.1 %   Platelets 349 150 - 400 K/uL   nRBC 0.0 0.0 - 0.2 %    Comment: Performed at [facility], [address], [place], [place] [address]  Protime-INR     Status: None   Collection Time: 01/15/19  5:09 AM  Result Value Ref Range  Prothrombin Time 13.3 11.4 - 15.2 seconds   INR 1.0 0.8 - 1.2    Comment: (NOTE) INR goal varies based on device and disease states. Performed at [facility], [address], [place], [place] [address]   CK     Status: Abnormal   Collection Time: 01/15/19  5:09 AM  Result Value Ref Range   Total CK 1,021 (H) 38 - 234 U/L    Comment: Performed at [facility], [address], [place], [place] [address]  Urine rapid drug screen (hosp performed)     Status: Abnormal   Collection Time: 01/15/19 11:45 AM  Result Value Ref Range   Opiates POSITIVE (A) NONE DETECTED   Cocaine NONE DETECTED NONE DETECTED   Benzodiazepines POSITIVE (A) NONE DETECTED   Amphetamines NONE DETECTED NONE DETECTED   Tetrahydrocannabinol NONE DETECTED NONE DETECTED   Barbiturates NONE DETECTED NONE DETECTED    Comment: (NOTE) DRUG SCREEN FOR MEDICAL PURPOSES ONLY.  IF CONFIRMATION IS NEEDED FOR ANY PURPOSE, NOTIFY LAB WITHIN 5 DAYS. LOWEST DETECTABLE LIMITS FOR URINE DRUG SCREEN Drug Class                     Cutoff (ng/mL) Amphetamine and metabolites    1000 Barbiturate and metabolites    200 Benzodiazepine                 200 Tricyclics and metabolites     300 Opiates and metabolites        300 Cocaine and metabolites        300 THC                            50 Performed at [facility], [address] [place], [place] [address]   Urinalysis, Routine w reflex microscopic     Status: Abnormal   Collection Time: 01/15/19 12:11 PM  Result Value Ref Range   Color, Urine YELLOW YELLOW   APPearance CLEAR CLEAR   Specific Gravity, Urine 1.009 1.005 - 1.030   pH  6.0 5.0 - 8.0   Glucose, UA NEGATIVE NEGATIVE mg/dL   Hgb urine dipstick NEGATIVE NEGATIVE   Bilirubin Urine NEGATIVE NEGATIVE   Ketones, ur NEGATIVE NEGATIVE mg/dL   Protein, ur NEGATIVE NEGATIVE mg/dL   Nitrite NEGATIVE NEGATIVE   Leukocytes,Ua TRACE (A) NEGATIVE   WBC, UA 6-10 0 - 5 WBC/hpf   Bacteria, UA RARE (A) NONE SEEN   Squamous Epithelial / LPF 0-5 0 - 5    Comment: Performed at [facility], [address] [place], [place] [address]  CBC with Differential/Platelet     Status: Abnormal   Collection Time: 01/16/19  3:39 AM  Result Value Ref Range   WBC 8.0 4.0 - 10.5 K/uL   RBC 3.25 (L) 3.87 - 5.11 MIL/uL   Hemoglobin 12.3 12.0 - 15.0 g/dL   HCT 96.7 59.1 - 63.8 %   MCV 114.2 (H) 80.0 - 100.0 fL   MCH 37.8 (H) 26.0 - 34.0 pg   MCHC 33.2 30.0 - 36.0 g/dL   RDW 46.6 59.9 - 35.7 %   Platelets 400 150 - 400 K/uL   nRBC 0.0 0.0 - 0.2 %   Neutrophils Relative % 77 %   Neutro Abs 6.1 1.7 - 7.7 K/uL   Lymphocytes Relative 15 %   Lymphs Abs 1.2 0.7 - 4.0 K/uL   Monocytes Relative 5 %  Monocytes Absolute 0.4 0.1 - 1.0 K/uL   Eosinophils Relative 2 %   Eosinophils Absolute 0.2 0.0 - 0.5 K/uL   Basophils Relative 1 %   Basophils Absolute 0.1 0.0 - 0.1 K/uL   Immature Granulocytes 0 %   Abs Immature Granulocytes 0.02 0.00 - 0.07 K/uL    Comment: Performed at [facility], [address] [place], [place] [address]  Basic metabolic panel     Status: Abnormal   Collection Time: 01/16/19  3:39 AM  Result Value Ref Range   Sodium 138 135 - 145 mmol/L   Potassium 3.5 3.5 - 5.1 mmol/L   Chloride 107 98 - 111 mmol/L   CO2 23 22 - 32 mmol/L   Glucose, Bld 131 (H) 70 - 99 mg/dL   BUN 6 6 - 20 mg/dL   Creatinine, Ser 6.04 0.44 - 1.00 mg/dL   Calcium 8.7 (L) 8.9 - 10.3 mg/dL   GFR calc non Af Amer >60 >60 mL/min   GFR calc Af Amer >60 >60 mL/min   Anion gap 8 5 - 15    Comment: Performed at [facility], [address], [place], [place] [address]  Magnesium      Status: None   Collection Time: 01/16/19  3:39 AM  Result Value Ref Range   Magnesium 2.3 1.7 - 2.4 mg/dL    Comment: Performed at [facility], [address] [place], [place] [address]  CK     Status: Abnormal   Collection Time: 01/16/19  3:39 AM  Result Value Ref Range   Total CK 1,762 (H) 38 - 234 U/L    Comment: Performed at [facility], [address], [place], [place] [address]    Medications:  Current Facility-Administered Medications  Medication Dose Route Frequency Provider Last Rate Last Dose  . 0.9 %  sodium chloride infusion   Intravenous Continuous Briant Cedar, MD 100 mL/hr at 01/15/19 2019    . acetaminophen (TYLENOL) tablet 650 mg  650 mg Oral Q6H PRN Stevie Kern A, NP   650 mg at 01/15/19 0023  . enoxaparin (LOVENOX) injection 40 mg  40 mg Subcutaneous Q24H Pahwani, Rinka R, MD   40 mg at 01/15/19 2015  . fentaNYL (SUBLIMAZE) injection 50 mcg  50 mcg Intravenous Once Tegeler, Canary Brim, MD      . folic acid (FOLVITE) tablet 1 mg  1 mg Oral Daily Pahwani, Rinka R, MD   1 mg at 01/16/19 1033  . HYDROcodone-acetaminophen (NORCO/VICODIN) 5-325 MG per tablet 1 tablet  1 tablet Oral Q4H PRN Briant Cedar, MD   1 tablet at 01/16/19 1451  . lamoTRIgine (LAMICTAL) tablet 50 mg  50 mg Oral BID Pahwani, Rinka R, MD   50 mg at 01/16/19 1032  . [START ON 01/17/2019] LORazepam (ATIVAN) injection 0-4 mg  0-4 mg Intravenous Q12H Sabas Sous, MD       Or  . Melene Muller ON 01/17/2019] LORazepam (ATIVAN) tablet 0-4 mg  0-4 mg Oral Q12H Sabas Sous, MD      . multivitamin with minerals tablet 1 tablet  1 tablet Oral Daily Pahwani, Rinka R, MD   1 tablet at 01/16/19 1031  . nicotine (NICODERM CQ - dosed in mg/24 hours) patch 14 mg  14 mg Transdermal Daily Pahwani, Rinka R, MD      . ondansetron (ZOFRAN) injection 4 mg  4 mg Intravenous Q6H PRN Pahwani, Kasandra Knudsen, MD      .  pantoprazole (PROTONIX) EC tablet 40 mg  40 mg Oral Daily Pahwani, Rinka R,  MD   40 mg at 01/16/19 1031  . thiamine (VITAMIN B-1) tablet 100 mg  100 mg Oral Daily Sabas Sous, MD   100 mg at 01/16/19 1030   Or  . thiamine (B-1) injection 100 mg  100 mg Intravenous Daily Sabas Sous, MD   100 mg at 01/14/19 1616  . vitamin B-12 (CYANOCOBALAMIN) tablet 1,000 mcg  1,000 mcg Oral Daily Briant Cedar, MD   1,000 mcg at 01/16/19 1030    Musculoskeletal: Strength & Muscle Tone: No atrophy noted Gait & Station: UTA since patient is lying in bed. Patient leans: N/A  Psychiatric Specialty Exam: Physical Exam  Nursing note and vitals reviewed. Constitutional: She is oriented to person, place, and time. She appears well-developed and well-nourished.  HENT:  Head: Normocephalic and atraumatic.  Neck: Normal range of motion.  Respiratory: Effort normal.  Musculoskeletal: Normal range of motion.  Neurological: She is alert and oriented to person, place, and time.  Psychiatric: She has a normal mood and affect. Her speech is normal and behavior is normal. Judgment and thought content normal. Cognition and memory are normal.    Review of Systems  Gastrointestinal: Negative for constipation, diarrhea, nausea and vomiting.  Musculoskeletal:       Chronic generalized pain.   Psychiatric/Behavioral: Negative for hallucinations and suicidal ideas.  All other systems reviewed and are negative.   Blood pressure (!) 146/78, pulse 89, temperature 98.4 F (36.9 C), temperature source Oral, resp. rate 16, height 5\' 3"  (1.6 m), weight 53.1 kg, SpO2 96 %.Body mass index is 20.74 kg/m.  General Appearance: Fairly Groomed, middle aged, Caucasian female who appears older than her stated age with a hospital gown who is lying in bed. NAD.   Eye Contact:  Good  Speech:  Clear and Coherent and Normal Rate  Volume:  Normal  Mood:  Euthymic  Affect:  Appropriate and Congruent  Thought Process:  Goal Directed, Linear and Descriptions of Associations: Intact  Orientation:   Full (Time, Place, and Person)  Thought Content:  Logical  Suicidal Thoughts:  No  Homicidal Thoughts:  No  Memory:  Immediate;   Good Recent;   Good Remote;   Good  Judgement:  Fair  Insight:  Fair  Psychomotor Activity:  Normal  Concentration:  Concentration: Good and Attention Span: Good  Recall:  Good  Fund of Knowledge:  Good  Language:  Good  Akathisia:  No  Handed:  Right  AIMS (if indicated):   N/A  Assets:  Communication Skills Desire for Improvement Housing Resilience Social Support  ADL's:  Impaired  Cognition:  WNL  Sleep:   N/A   Assessment:  Ariel White is a 58 y.o. female who was admitted with altered mental status. Her hospital course has been complicated by rhabdomyolysis and acute metabolic encephalopathy. Patient is oriented to person, place and time on interview. She denies SI, HI or AVH. She does not appear to be responding to internal stimuli. It appears that patient's mental status has fluctuated in the setting of current medical condition which is most consistent with delirium. Recommend PRN Zyprexa for agitation related to confusion.   Treatment Plan Summary: -Start Zyprexa 2.5 mg BID PRN for agitation related to altered mental status.  -EKG reviewed and QTc 447 on 9/21. Please closely monitor when starting or increasing QTc prolonging agents.   -Psychiatry will sign off on patient at  this time. Please consult psychiatry again as needed.   Disposition: No evidence of imminent risk to self or others at present.   Patient does not meet criteria for psychiatric inpatient admission.  This service was provided via telemedicine using a 2-way, interactive audio and video technology.  Names of all persons participating in this telemedicine service and their role in this encounter. Name: Juanetta BeetsJacqueline Nickolai Rinks, DO Role: Psychiatrist   Name: Ariel MannEdna Salamon  Role: Patient     Cherly BeachJacqueline J Betzayda Braxton, DO 01/16/2019 3:39 PM

## 2019-01-16 NOTE — Progress Notes (Signed)
Pt extremely verbally abusive towards staff.

## 2019-01-16 NOTE — Progress Notes (Signed)
PROGRESS NOTE    Ariel White  UJW:[id number] DOB: 10/13/1960 DOA: 01/14/2019 PCP: Ariel White, Danielle, PA-C    Brief Narrative:  HPI from Dr Lance CoonPahwani Ariel M Parkeris a 58 y.o.femalewith medical history significant ofbipolar disorder, depression/anxiety, tobacco abuse, alcohol abuse, GERD brought by EMS to emergency department due to concern of altered mental status. Patient reports that she was trying to get out of the bed in the morning to go to bathroom,she fell and hit on head and passed out. When she woke up she found herself in the vomitus. She had to drag herself to the kitchen as her phone was on the kitchen table,she called 911 and brought to the emergency department for further evaluation and management. Complaining of headache, right shoulder and left leg pain. She has multiple bruises on her right eye, nose and both legs. She reports that she smokes 5 cigarettes/day-trying to quit and drinks alcohol only on the weekends (although reports drinks to help her sleep). Denies illicit drug use. She lives alone and independent of daily life activities does not use walker or cane for ambulation. She denies previous history of alcohol withdrawal. In the ED, CK level: 1792. Ethanol: Undetectable, troponin x1-. Lactic acid: WNL. Imaging such as chest x-ray, pelvic x-ray, left foot, left tibia-fibula, right humerus, left femur, right shoulder, CT abdomen/pelvis, CT chest, CT maxillofacial, CT cervical spine and CT head all came back negative for acute findings.   Assessment & Plan:   Active Problems:   Depression, unspecified   Alcohol use disorder, mild, abuse   Altered mental state   Tobacco abuse   Anxiety   Rhabdomyolysis   Macrocytosis   Status post fall   Syncope  Acute metabolic encephalopathy: Unknown baseline mental status, however she has remained somehow confused and impulsive otherwise back to her baseline. Alcohol level undetectable, lactic acid normal, troponin  negative, EKG normal.  Slightly elevated CK levels.  UA negative.  EEG normal.  TSH normal.  UDS positive for benzo and opiates likely from ER administration. Suspect exacerbation of underlying bipolar disorder, will consult psychiatry.  She is on Lamictal at home that is continued.   Occasionally drinks alcohol as per sister, keep on low dose benzo PRN.   Fall with multiple bruises: No skeletal injury on survey.  With deconditioning.  PT OT to follow-up.  Patient has some balance issues.  She will benefit with ongoing supervision and therapies.  Rhabdomyolysis: CK level is still elevated.  Remains on IV fluids.  Renal functions normal.  Anticipate it will improve.  She is restless in the bed, sometimes needing soft restraints that will cause persistent high CK.  Will recheck in the morning.  Alcohol abuse: Less likely alcohol withdrawal.  On symptomatic treatment with PRN benzodiazepine.  Vitamin B12 deficiency: She has very low level of vitamin B12.  Started on replacement.    DVT prophylaxis: Lovenox Code Status: Full code Family Communication: son could not pick up , discussed and updated patient;s sister Ariel DroneBrenda. Sister says patient is pretty well controlled bipolar at home. She has h/o brain tumor removed. Disposition Plan: Pending.  Will consult psychiatry whether she will need inpatient psych admission.   Consultants:   Psychiatry  Procedures:   None  Antimicrobials:   None   Subjective: Patient seen and examined.  Overnight events noted.  She is confused and impulsive.  Nursing reported difficult to control behavior and randomly trying to walk around in the hallway without cloths on.  Objective: Vitals:  01/16/19 0029 01/16/19 0458 01/16/19 0740 01/16/19 1214  BP: (!) 170/98 (!) 179/95 (!) 147/83 (!) 146/78  Pulse: 99 99 88 89  Resp: 18 18 16 16   Temp: 98.6 F (37 C) 97.7 F (36.5 C) (!) 97.5 F (36.4 C) 98.4 F (36.9 C)  TempSrc: Oral Oral Axillary Oral  SpO2:  100% 100% 100% 96%  Weight:      Height:        Intake/Output Summary (Last 24 hours) at 01/16/2019 1252 Last data filed at 01/16/2019 1216 Gross per 24 hour  Intake 2105.22 ml  Output 700 ml  Net 1405.22 ml   Filed Weights   01/15/19 0001  Weight: 53.1 kg    Examination:  General exam: Appears calm and comfortable  Respiratory system: Clear to auscultation. Respiratory effort normal. Cardiovascular system: S1 & S2 heard, RRR. No JVD, murmurs, rubs, gallops or clicks. No pedal edema. Gastrointestinal system: Abdomen is nondistended, soft and nontender. No organomegaly or masses felt. Normal bowel sounds heard. Central nervous system: Alert and oriented x2.  Pleasantly confused.. No focal neurological deficits.  Moves all extremities. Extremities: Symmetric 5 x 5 power. Skin: No rashes, lesions or ulcers Psychiatry: Judgement and insight appear compromised.  Mood & affect anxious and flat.  She herself denies any delusion or hallucinations. Multiple abrasion on the legs.   Data Reviewed: I have personally reviewed following labs and imaging studies  CBC: Recent Labs  Lab 01/14/19 1344 01/14/19 1648 01/14/19 1649 01/15/19 0509 01/16/19 0339  WBC 8.4 7.4  --  6.5 8.0  NEUTROABS  --   --   --   --  6.1  HGB 14.0 11.8*  --  11.4* 12.3  HCT 40.4 35.4* 34.7 35.0* 37.1  MCV 111.3* 114.9*  --  115.9* 114.2*  PLT 419* 379  --  349 400   Basic Metabolic Panel: Recent Labs  Lab 01/14/19 1350 01/14/19 1648 01/15/19 0509 01/16/19 0339  NA 135  --  138 138  K 4.0  --  3.5 3.5  CL 102  --  112* 107  CO2 16*  --  17* 23  GLUCOSE 91  --  113* 131*  BUN 18  --  10 6  CREATININE 1.01* 0.82 0.77 0.75  CALCIUM 9.8  --  7.7* 8.7*  MG  --  1.5*  --  2.3  PHOS  --  3.0  --   --    GFR: Estimated Creatinine Clearance: 64.2 mL/min (by C-G formula based on SCr of 0.75 mg/dL). Liver Function Tests: Recent Labs  Lab 01/14/19 1350 01/15/19 0509  AST 60* 45*  ALT 18 15   ALKPHOS 87 71  BILITOT 0.9 0.3  PROT 7.0 5.5*  ALBUMIN 4.0 3.0*   No results for input(s): LIPASE, AMYLASE in the last 168 hours. No results for input(s): AMMONIA in the last 168 hours. Coagulation Profile: Recent Labs  Lab 01/15/19 0509  INR 1.0   Cardiac Enzymes: Recent Labs  Lab 01/14/19 1350 01/15/19 0509 01/16/19 0339  CKTOTAL 1,792* 1,021* 1,762*   BNP (last 3 results) No results for input(s): PROBNP in the last 8760 hours. HbA1C: No results for input(s): HGBA1C in the last 72 hours. CBG: No results for input(s): GLUCAP in the last 168 hours. Lipid Profile: No results for input(s): CHOL, HDL, LDLCALC, TRIG, CHOLHDL, LDLDIRECT in the last 72 hours. Thyroid Function Tests: Recent Labs    01/14/19 1648  TSH 2.082   Anemia Panel: Recent Labs  01/14/19 1648  VITAMINB12 118*   Sepsis Labs: Recent Labs  Lab 01/14/19 1350  LATICACIDVEN 1.5    Recent Results (from the past 240 hour(s))  SARS CORONAVIRUS 2 (TAT 6-24 HRS) Nasopharyngeal Nasopharyngeal Swab     Status: None   Collection Time: 01/14/19  4:48 PM   Specimen: Nasopharyngeal Swab  Result Value Ref Range Status   SARS Coronavirus 2 NEGATIVE NEGATIVE Final    Comment: (NOTE) SARS-CoV-2 target nucleic acids are NOT DETECTED. The SARS-CoV-2 RNA is generally detectable in upper and lower respiratory specimens during the acute phase of infection. Negative results do not preclude SARS-CoV-2 infection, do not rule out co-infections with other pathogens, and should not be used as the sole basis for treatment or other patient management decisions. Negative results must be combined with clinical observations, patient history, and epidemiological information. The expected result is Negative. Fact Sheet for Patients: HairSlick.no Fact Sheet for Healthcare Providers: quierodirigir.com This test is not yet approved or cleared by the [place] FDA  and  has been authorized for detection and/or diagnosis of SARS-CoV-2 by FDA under an Emergency Use Authorization (EUA). This EUA will remain  in effect (meaning this test can be used) for the duration of the COVID-19 declaration under Section 56 4(b)(1) of the Act, 21 U.S.C. section 360bbb-3(b)(1), unless the authorization is terminated or revoked sooner. Performed at [facility], [address] [place], [place] [address]          Radiology Studies: Ct Abdomen Pelvis Wo Contrast  Result Date: 01/14/2019 CLINICAL DATA:  Fall EXAM: CT CHEST, ABDOMEN AND PELVIS WITHOUT CONTRAST TECHNIQUE: Multidetector CT imaging of the chest, abdomen and pelvis was performed following the standard protocol without IV contrast. COMPARISON:  Partial comparison to right shoulder radiographs dated 11/14/2018. FINDINGS: CT CHEST FINDINGS Cardiovascular: No evidence of traumatic aortic injury. Ectasia of the ascending thoracic aorta, measuring 3.5 cm. The heart is normal in size.  No pericardial effusion. Mediastinum/Nodes: No evidence of hematoma along the anterior mediastinum. No suspicious mediastinal lymphadenopathy. Visualized thyroid is unremarkable. Lungs/Pleura: Biapical pleural-parenchymal scarring. No suspicious pulmonary nodules. No focal consolidation. No pleural effusion or pneumothorax. Musculoskeletal: Thoracic spine is within normal limits. Right shoulder arthroplasty, without evidence of complication. Cortical buckling along the lateral right clavicle (series 3/image 5). However, when correlating with prior studies, this favors a chronic deformity. Old right posterior 10th and 11th rib fracture deformities. No acute fracture is seen. Specifically, the clavicles, sternum, scapulae, and bilateral ribs are intact. CT ABDOMEN PELVIS FINDINGS Motion degraded images Hepatobiliary: Unenhanced liver is unremarkable. No. Fat fluid/hemorrhage. Gallbladder is unremarkable. No intrahepatic or extrahepatic  ductal dilatation. Pancreas: Within normal limits. Spleen: Within normal limits.  No perisplenic fluid/hemorrhage. Adrenals/Urinary Tract: Adrenal glands are within normal limits. Punctate interpolar left renal calculus (series 3/image 70). Additional 5 mm nonobstructing right upper pole renal calculus (series 3/image 35). No hydronephrosis. Bladder is within normal limits. Stomach/Bowel: Stomach is within normal limits. No evidence of bowel obstruction. Prior appendectomy. Vascular/Lymphatic: No evidence of abdominal aortic aneurysm. No suspicious abdominopelvic lymphadenopathy. Reproductive: Uterus is unremarkable. Left ovary is within normal limits.  No right adnexal mass. Other: No abdominopelvic ascites. No hemoperitoneum or free air. Musculoskeletal: Mild degenerative changes at L2-3. No fracture is seen. IMPRESSION: Suspected chronic deformity involving the lateral right clavicle. Correlate for point tenderness to exclude acute on chronic injury. Otherwise, no evidence of acute traumatic injury to the chest, abdomen, or pelvis. Old right posterior 10th and 11th rib fracture deformities.  Electronically Signed   By: Julian Hy M.D.   On: 01/14/2019 13:36   Dg Chest 1 View  Result Date: 01/14/2019 CLINICAL DATA:  Fall, pain, bruising EXAM: CHEST  1 VIEW COMPARISON:  01/01/2014 FINDINGS: Lungs are clear.  No pleural effusion or pneumothorax. The heart is normal in size. Right shoulder arthroplasty, incompletely visualized. Right lateral clavicle deformity noted on recent CT is excluded from this image, but a similar deformity was present in 2015. Multiple right rib deformities, including the right anterior 7th and 8th ribs, which were not evident on recent CT due to motion degradation. Right lateral 10th rib deformity is favored to be chronic on CT. IMPRESSION: Right anterior 7th and 8th rib deformities were not evident on recent CT due to motion degradation. Correlate for point tenderness. Right  lateral 10th rib deformity is favored to be chronic when correlating with recent CT. Right lateral clavicle deformity noted on recent CT is excluded from the current study, but a similar deformity was present on prior 2015 chest radiograph. Electronically Signed   By: Julian Hy M.D.   On: 01/14/2019 14:07   Dg Pelvis 1-2 Views  Result Date: 01/14/2019 CLINICAL DATA:  Fall, pain, bruising EXAM: PELVIS - 1-2 VIEW COMPARISON:  None. FINDINGS: No fracture or dislocation is seen. Bowel hip joint spaces are preserved. Visualized bony pelvis appears intact. IMPRESSION: Negative. Electronically Signed   By: Julian Hy M.D.   On: 01/14/2019 13:56   Dg Shoulder Right  Result Date: 01/14/2019 CLINICAL DATA:  Fall, pain EXAM: RIGHT SHOULDER - 2+ VIEW; RIGHT HUMERUS - 2+ VIEW COMPARISON:  11/26/2013 FINDINGS: Status post right shoulder humeral arthroplasty. No fracture or dislocation of the right shoulder. The acromioclavicular joint is intact. The partially imaged right chest is unremarkable. No fracture or dislocation of the distal right humerus. Soft tissues are normal. IMPRESSION: 1. Status post right shoulder humeral arthroplasty. No fracture or dislocation of the right shoulder. 2.  No fracture or dislocation of the distal right humerus. Electronically Signed   By: Eddie Candle M.D.   On: 01/14/2019 13:59   Dg Tibia/fibula Left  Result Date: 01/14/2019 CLINICAL DATA:  Fall, hip pain EXAM: LEFT TIBIA AND FIBULA - 2 VIEW COMPARISON:  None. FINDINGS: No fracture or dislocation is seen. The joint spaces are preserved. Visualized soft tissues are within normal limits. IMPRESSION: Negative. Electronically Signed   By: Julian Hy M.D.   On: 01/14/2019 13:55   Ct Head Wo Contrast  Result Date: 01/14/2019 CLINICAL DATA:  Fall, AMS EXAM: CT HEAD WITHOUT CONTRAST CT MAXILLOFACIAL WITHOUT CONTRAST CT CERVICAL SPINE WITHOUT CONTRAST TECHNIQUE: Multidetector CT imaging of the head, cervical spine,  and maxillofacial structures were performed using the standard protocol without intravenous contrast. Multiplanar CT image reconstructions of the cervical spine and maxillofacial structures were also generated. COMPARISON:  08/07/2015, 07/11/2009 FINDINGS: CT HEAD FINDINGS Brain: No evidence of acute infarction, hemorrhage, hydrocephalus, extra-axial collection or mass lesion/mass effect. There are post resection findings of the cerebellum with overlying occipital craniotomy (series 3, image 12, series 6, image 31). Vascular: No hyperdense vessel or unexpected calcification. CT FACIAL BONES FINDINGS Skull: Redemonstrated postoperative findings of occipital craniotomy. Negative for fracture or focal lesion. Facial bones: No displaced fractures or dislocations. Sinuses/Orbits: No acute finding. 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 Although not completely imaged on prior examinations this is grossly unchanged in comparison to  partial imaging on remote prior head CT dated 07/11/2009. Other: None. CT CERVICAL SPINE FINDINGS Alignment: Degenerative straightening and multilevel degenerative anterolisthesis. Skull base and vertebrae: No acute fracture. No primary bone lesion or focal pathologic process. Soft tissues and spinal canal: No prevertebral fluid or swelling. No visible canal hematoma. Disc levels:  Severe multilevel disc degenerative and osteophytosis. Upper chest: Negative. Other: None. IMPRESSION: 1.  No acute intracranial pathology. 2. Redemonstrated postoperative findings of cerebellar resection with overlying occipital craniotomy. 3.  No displaced fracture or dislocation of the facial bones. 4. 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 Although not completely imaged on prior examinations this is grossly unchanged in  comparison to partial imaging on remote prior head CT dated 07/11/2009. 5. No fracture or static subluxation of the cervical spine. Severe multilevel disc degenerative disease. Electronically Signed   By: Lauralyn Primes M.D.   On: 01/14/2019 13:32   Ct Chest Wo Contrast  Result Date: 01/14/2019 CLINICAL DATA:  Fall EXAM: CT CHEST, ABDOMEN AND PELVIS WITHOUT CONTRAST TECHNIQUE: Multidetector CT imaging of the chest, abdomen and pelvis was performed following the standard protocol without IV contrast. COMPARISON:  Partial comparison to right shoulder radiographs dated 11/14/2018. FINDINGS: CT CHEST FINDINGS Cardiovascular: No evidence of traumatic aortic injury. Ectasia of the ascending thoracic aorta, measuring 3.5 cm. The heart is normal in size.  No pericardial effusion. Mediastinum/Nodes: No evidence of hematoma along the anterior mediastinum. No suspicious mediastinal lymphadenopathy. Visualized thyroid is unremarkable. Lungs/Pleura: Biapical pleural-parenchymal scarring. No suspicious pulmonary nodules. No focal consolidation. No pleural effusion or pneumothorax. Musculoskeletal: Thoracic spine is within normal limits. Right shoulder arthroplasty, without evidence of complication. Cortical buckling along the lateral right clavicle (series 3/image 5). However, when correlating with prior studies, this favors a chronic deformity. Old right posterior 10th and 11th rib fracture deformities. No acute fracture is seen. Specifically, the clavicles, sternum, scapulae, and bilateral ribs are intact. CT ABDOMEN PELVIS FINDINGS Motion degraded images Hepatobiliary: Unenhanced liver is unremarkable. No. Fat fluid/hemorrhage. Gallbladder is unremarkable. No intrahepatic or extrahepatic ductal dilatation. Pancreas: Within normal limits. Spleen: Within normal limits.  No perisplenic fluid/hemorrhage. Adrenals/Urinary Tract: Adrenal glands are within normal limits. Punctate interpolar left renal calculus (series 3/image 70).  Additional 5 mm nonobstructing right upper pole renal calculus (series 3/image 35). No hydronephrosis. Bladder is within normal limits. Stomach/Bowel: Stomach is within normal limits. No evidence of bowel obstruction. Prior appendectomy. Vascular/Lymphatic: No evidence of abdominal aortic aneurysm. No suspicious abdominopelvic lymphadenopathy. Reproductive: Uterus is unremarkable. Left ovary is within normal limits.  No right adnexal mass. Other: No abdominopelvic ascites. No hemoperitoneum or free air. Musculoskeletal: Mild degenerative changes at L2-3. No fracture is seen. IMPRESSION: Suspected chronic deformity involving the lateral right clavicle. Correlate for point tenderness to exclude acute on chronic injury. Otherwise, no evidence of acute traumatic injury to the chest, abdomen, or pelvis. Old right posterior 10th and 11th rib fracture deformities. Electronically Signed   By: Charline Bills M.D.   On: 01/14/2019 13:36   Ct Cervical Spine Wo Contrast  Result Date: 01/14/2019 CLINICAL DATA:  Fall, AMS EXAM: CT HEAD WITHOUT CONTRAST CT MAXILLOFACIAL WITHOUT CONTRAST CT CERVICAL SPINE WITHOUT CONTRAST TECHNIQUE: Multidetector CT imaging of the head, cervical spine, and maxillofacial structures were performed using the standard protocol without intravenous contrast. Multiplanar CT image reconstructions of the cervical spine and maxillofacial structures were also generated. COMPARISON:  08/07/2015, 07/11/2009 FINDINGS:  CT HEAD FINDINGS Brain: No evidence of acute infarction, hemorrhage, hydrocephalus, extra-axial collection or mass lesion/mass effect. There are post resection findings of the cerebellum with overlying occipital craniotomy (series 3, image 12, series 6, image 31). Vascular: No hyperdense vessel or unexpected calcification. CT FACIAL BONES FINDINGS Skull: Redemonstrated postoperative findings of occipital craniotomy. Negative for fracture or focal lesion. Facial bones: No displaced fractures  or dislocations. Sinuses/Orbits: No acute finding. 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 Although not completely imaged on prior examinations this is grossly unchanged in comparison to partial imaging on remote prior head CT dated 07/11/2009. Other: None. CT CERVICAL SPINE FINDINGS Alignment: Degenerative straightening and multilevel degenerative anterolisthesis. Skull base and vertebrae: No acute fracture. No primary bone lesion or focal pathologic process. Soft tissues and spinal canal: No prevertebral fluid or swelling. No visible canal hematoma. Disc levels:  Severe multilevel disc degenerative and osteophytosis. Upper chest: Negative. Other: None. IMPRESSION: 1.  No acute intracranial pathology. 2. Redemonstrated postoperative findings of cerebellar resection with overlying occipital craniotomy. 3.  No displaced fracture or dislocation of the facial bones. 4. 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 Although not completely imaged on prior examinations this is grossly unchanged in comparison to partial imaging on remote prior head CT dated 07/11/2009. 5. No fracture or static subluxation of the cervical spine. Severe multilevel disc degenerative disease. Electronically Signed   By: Lauralyn Primes M.D.   On: 01/14/2019 13:32   Dg Humerus Right  Result Date: 01/14/2019 CLINICAL DATA:  Fall, pain EXAM: RIGHT SHOULDER - 2+ VIEW; RIGHT HUMERUS - 2+ VIEW COMPARISON:  11/26/2013 FINDINGS: Status post right shoulder humeral arthroplasty. No fracture or dislocation of the right shoulder. The acromioclavicular joint is intact. The partially imaged right chest is unremarkable. No fracture or dislocation of the distal right humerus. Soft tissues are normal. IMPRESSION: 1. Status post right shoulder humeral arthroplasty. No fracture or  dislocation of the right shoulder. 2.  No fracture or dislocation of the distal right humerus. Electronically Signed   By: Lauralyn Primes M.D.   On: 01/14/2019 13:59   Dg Foot Complete Left  Result Date: 01/14/2019 CLINICAL DATA:  Fall, pain EXAM: LEFT FOOT - COMPLETE 3+ VIEW COMPARISON:  None. FINDINGS: No fracture or dislocation is seen. The joint spaces are preserved. Visualized soft tissues are within normal limits. IMPRESSION: Negative. Electronically Signed   By: Charline Bills M.D.   On: 01/14/2019 13:55   Dg Femur Min 2 Views Left  Result Date: 01/14/2019 CLINICAL DATA:  Fall, hip pain EXAM: LEFT FEMUR 2 VIEWS COMPARISON:  None. FINDINGS: No fracture or dislocation is seen. The joint spaces are preserved. Visualized soft tissues are within normal limits. Visualized bony pelvis appears intact. IMPRESSION: Negative. Electronically Signed   By: Charline Bills M.D.   On: 01/14/2019 13:55   Ct Maxillofacial Wo Contrast  Result Date: 01/14/2019 CLINICAL DATA:  Fall, AMS EXAM: CT HEAD WITHOUT CONTRAST CT MAXILLOFACIAL WITHOUT CONTRAST CT CERVICAL SPINE WITHOUT CONTRAST TECHNIQUE: Multidetector CT imaging of the head, cervical spine, and maxillofacial structures were performed using the standard protocol without intravenous contrast. Multiplanar CT image reconstructions of the cervical spine and maxillofacial structures were also generated. COMPARISON:  08/07/2015, 07/11/2009 FINDINGS: CT HEAD FINDINGS Brain: No evidence of acute infarction, hemorrhage, hydrocephalus, extra-axial collection  or mass lesion/mass effect. There are post resection findings of the cerebellum with overlying occipital craniotomy (series 3, image 12, series 6, image 31). Vascular: No hyperdense vessel or unexpected calcification. CT FACIAL BONES FINDINGS Skull: Redemonstrated postoperative findings of occipital craniotomy. Negative for fracture or focal lesion. Facial bones: No displaced fractures or dislocations.  Sinuses/Orbits: No acute finding. 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 Although not completely imaged on prior examinations this is grossly unchanged in comparison to partial imaging on remote prior head CT dated 07/11/2009. Other: None. CT CERVICAL SPINE FINDINGS Alignment: Degenerative straightening and multilevel degenerative anterolisthesis. Skull base and vertebrae: No acute fracture. No primary bone lesion or focal pathologic process. Soft tissues and spinal canal: No prevertebral fluid or swelling. No visible canal hematoma. Disc levels:  Severe multilevel disc degenerative and osteophytosis. Upper chest: Negative. Other: None. IMPRESSION: 1.  No acute intracranial pathology. 2. Redemonstrated postoperative findings of cerebellar resection with overlying occipital craniotomy. 3.  No displaced fracture or dislocation of the facial bones. 4. 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 Although not completely imaged on prior examinations this is grossly unchanged in comparison to partial imaging on remote prior head CT dated 07/11/2009. 5. No fracture or static subluxation of the cervical spine. Severe multilevel disc degenerative disease. Electronically Signed   By: Lauralyn Primes M.D.   On: 01/14/2019 13:32        Scheduled Meds: . enoxaparin (LOVENOX) injection  40 mg Subcutaneous Q24H  . fentaNYL (SUBLIMAZE) injection  50 mcg Intravenous Once  . folic acid  1 mg Oral Daily  . lamoTRIgine  50 mg Oral BID  . [START ON 01/17/2019] LORazepam  0-4 mg Intravenous Q12H   Or  . [START ON 01/17/2019] LORazepam  0-4 mg Oral Q12H  . multivitamin with minerals  1 tablet Oral Daily  . nicotine  14 mg Transdermal Daily  . pantoprazole  40 mg Oral Daily  . thiamine  100 mg Oral Daily   Or  . thiamine  100 mg Intravenous Daily   . vitamin B-12  1,000 mcg Oral Daily   Continuous Infusions: . sodium chloride 100 mL/hr at 01/15/19 2019     LOS: 1 day    Time spent: 30 minutes    Dorcas Carrow, MD [facility] Pager [phone]  If 7PM-7AM, please contact night-coverage [email or web address] Password [facility] 01/16/2019, 12:52 PM

## 2019-01-16 NOTE — Progress Notes (Signed)
Pt's sister Leane Para updated at [place].

## 2019-01-17 DIAGNOSIS — R410 Disorientation, unspecified: Secondary | ICD-10-CM

## 2019-01-17 LAB — CBC WITH DIFFERENTIAL/PLATELET
Abs Immature Granulocytes: 0.02 10*3/uL (ref 0.00–0.07)
Basophils Absolute: 0.1 10*3/uL (ref 0.0–0.1)
Basophils Relative: 1 %
Eosinophils Absolute: 0.3 10*3/uL (ref 0.0–0.5)
Eosinophils Relative: 5 %
HCT: 35.5 % — ABNORMAL LOW (ref 36.0–46.0)
Hemoglobin: 11.7 g/dL — ABNORMAL LOW (ref 12.0–15.0)
Immature Granulocytes: 0 %
Lymphocytes Relative: 29 %
Lymphs Abs: 1.5 10*3/uL (ref 0.7–4.0)
MCH: 37.6 pg — ABNORMAL HIGH (ref 26.0–34.0)
MCHC: 33 g/dL (ref 30.0–36.0)
MCV: 114.1 fL — ABNORMAL HIGH (ref 80.0–100.0)
Monocytes Absolute: 0.5 10*3/uL (ref 0.1–1.0)
Monocytes Relative: 9 %
Neutro Abs: 2.9 10*3/uL (ref 1.7–7.7)
Neutrophils Relative %: 56 %
Platelets: 398 10*3/uL (ref 150–400)
RBC: 3.11 MIL/uL — ABNORMAL LOW (ref 3.87–5.11)
RDW: 13.2 % (ref 11.5–15.5)
WBC: 5.3 10*3/uL (ref 4.0–10.5)
nRBC: 0 % (ref 0.0–0.2)

## 2019-01-17 LAB — COMPREHENSIVE METABOLIC PANEL
ALT: 15 U/L (ref 0–44)
AST: 35 U/L (ref 15–41)
Albumin: 2.9 g/dL — ABNORMAL LOW (ref 3.5–5.0)
Alkaline Phosphatase: 73 U/L (ref 38–126)
Anion gap: 10 (ref 5–15)
BUN: <5 mg/dL — ABNORMAL LOW (ref 6–20)
CO2: 23 mmol/L (ref 22–32)
Calcium: 8.9 mg/dL (ref 8.9–10.3)
Chloride: 107 mmol/L (ref 98–111)
Creatinine, Ser: 0.57 mg/dL (ref 0.44–1.00)
GFR calc Af Amer: >60 mL/min (ref >60)
GFR calc non Af Amer: >60 mL/min (ref >60)
Glucose, Bld: 113 mg/dL — ABNORMAL HIGH (ref 70–99)
Potassium: 3 mmol/L — ABNORMAL LOW (ref 3.5–5.1)
Sodium: 140 mmol/L (ref 135–145)
Total Bilirubin: 0.3 mg/dL (ref 0.3–1.2)
Total Protein: 5.9 g/dL — ABNORMAL LOW (ref 6.5–8.1)

## 2019-01-17 LAB — AMMONIA: Ammonia: 41 umol/L — ABNORMAL HIGH (ref 9–35)

## 2019-01-17 LAB — CK: Total CK: 620 U/L — ABNORMAL HIGH (ref 38–234)

## 2019-01-17 MED ORDER — OLANZAPINE 2.5 MG PO TABS
2.5000 mg | ORAL_TABLET | Freq: Two times a day (BID) | ORAL | Status: DC
  Administered 2019-01-17 – 2019-01-22 (×11): 2.5 mg via ORAL
  Filled 2019-01-17 (×11): qty 1

## 2019-01-17 MED ORDER — MORPHINE SULFATE (PF) 2 MG/ML IV SOLN
2.0000 mg | Freq: Once | INTRAVENOUS | Status: AC
  Administered 2019-01-17: 06:00:00 2 mg via INTRAVENOUS
  Filled 2019-01-17: qty 1

## 2019-01-17 MED ORDER — LACTULOSE 10 GM/15ML PO SOLN
20.0000 g | Freq: Two times a day (BID) | ORAL | Status: DC
  Administered 2019-01-17 – 2019-01-21 (×4): 20 g via ORAL
  Filled 2019-01-17 (×9): qty 30

## 2019-01-17 MED ORDER — POTASSIUM CHLORIDE CRYS ER 20 MEQ PO TBCR
40.0000 meq | EXTENDED_RELEASE_TABLET | Freq: Three times a day (TID) | ORAL | Status: DC
  Administered 2019-01-17 – 2019-01-18 (×3): 40 meq via ORAL
  Filled 2019-01-17 (×3): qty 2

## 2019-01-17 NOTE — TOC Initial Note (Signed)
Transition of Care [facility]) - Initial/Assessment Note    Patient Details  Name: Ariel White MRN: [id number] Date of Birth: 14-Mar-1961  [facility] CM/SW Contact:    Eileen Stanford, LCSW Phone Number: 01/17/2019, 11:42 AM  Clinical Narrative:    CSW spoke with pt and pt is agreeable to SNF. Pt would like placement in [place]. Pt states "I don't even know why they sent me here I live in [place]." Pt lives alone. CSW f/o referral and will follow up with b/o once available.               Expected Discharge Plan: Skilled Nursing Facility Barriers to Discharge: Continued Medical Work up   Patient Goals and CMS Choice        Expected Discharge Plan and Services Expected Discharge Plan: [place] In-house Referral: NA   Post Acute Care Choice: [place] Living arrangements for the past 2 months: Single Family Home                                      Prior Living Arrangements/Services Living arrangements for the past 2 months: Single Family Home Lives with:: Self Patient language and need for interpreter reviewed:: Yes Do you feel safe going back to the place where you live?: Yes      Need for Family Participation in Patient Care: Yes (Comment) Care giver support system in place?: Yes (comment)   Criminal Activity/Legal Involvement Pertinent to Current Situation/Hospitalization: No - Comment as needed  Activities of Daily Living      Permission Sought/Granted   Permission granted to share information with : Yes, Verbal Permission Granted     Permission granted to share info w AGENCY: [place] facilities        Emotional Assessment Appearance:: Appears stated age Attitude/Demeanor/Rapport: Engaged Affect (typically observed): Anxious, Frustrated Orientation: : Oriented to Self, Oriented to Place, Oriented to  Time, Oriented to Situation Alcohol / Substance Use: Not Applicable Psych Involvement: No  (comment)  Admission diagnosis:  Fall [W19.XXXA] Traumatic rhabdomyolysis, initial encounter ([place]) [T79.6XXA] Altered mental status, unspecified altered mental status type [R41.82] Patient Active Problem List   Diagnosis Date Noted  . Syncope 01/15/2019  . Altered mental state 01/14/2019  . Tobacco abuse 01/14/2019  . Anxiety 01/14/2019  . Rhabdomyolysis 01/14/2019  . Macrocytosis 01/14/2019  . Status post fall 01/14/2019  . Fall   . Avascular necrosis of head of humerus ([place]) 11/14/2018  . Fibromyalgia 09/17/2015  . Myofascial pain syndrome 08/29/2015  . DDD (degenerative disc disease), cervical 08/29/2015  . Pain in joint, shoulder region 08/29/2015  . Chronic thoracic back pain 08/29/2015  . Depression, unspecified 02/01/2015  . Alcohol use disorder, mild, abuse 02/01/2015  . 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

## 2019-01-17 NOTE — Progress Notes (Signed)
PROGRESS NOTE    Ariel White  QAE:[id number] DOB: 1960/07/24 DOA: 01/14/2019 PCP: Carren Rang, PA-C    Brief Narrative:   58 y.o.white femalebipolar disorder, depression/anxiety, tobacco abuse, alcohol abuse, GERD brought by EMS to emergency department due to concern of altered mental status.   Patient reports that she was trying to get out of the bed in the morning to go to bathroom,she fell and hit on head and passed out. - found herself in the vomitus. She had to drag herself to the kitchen as her phone was on the kitchen table,she called 911 and brought to the emergency department for further evaluation and management.   Complaining of headache, right shoulder and left leg pain. She has multiple bruises on her right eye, nose and both legs. She reports that she smokes 5 cigarettes/day-trying to quit and drinks alcohol only on the weekends (although reports drinks to help her sleep). Denies illicit drug use. She lives alone and independent of daily life activities does not use walker or cane for ambulation. She denies previous history of alcohol withdrawal.   In the ED, CK level: 1792. Ethanol: Undetectable, troponin x1-. Lactic acid: WNL. Imaging such as chest x-ray, pelvic x-ray, left foot, left tibia-fibula, right humerus, left femur, right shoulder, CT abdomen/pelvis, CT chest, CT maxillofacial, CT cervical spine and CT head all came back negative for acute findings.   Assessment & Plan:   Principal Problem:   Altered mental state Active Problems:   Depression, unspecified   Alcohol use disorder, mild, abuse   Tobacco abuse   Anxiety   Rhabdomyolysis   Macrocytosis   Status post fall   Syncope  Acute metabolic encephalopathy:  Alcohol level undetectable, lactic acid normal, troponin negative, EKG normal.  Slightly elevated CK levels.  UA negative.  EEG normal.  TSH normal.  UDS positive for benzo and opiates likely from ER administration. Ammonia slightly  elevated starting lactulose twice daily 9/23-monitor Psychiatry has seen the patient, recommends Zyprexa 2.5 twice daily EEG 9/21 cortical frontotemporal dysfunction no epileptiform issues Occasionally drinks alcohol as per sister, keep on low dose benzo PRN.   Fall with multiple bruises: No skeletal injury on survey.  With deconditioning.  PT OT recommending skilled care placement with rolling walker wheels  Rhabdomyolysis: CK level trending downwards.  Remains on IV fluids.  Renal functions normal.  Recheck in a.m.  Alcohol abuse: Less likely alcohol withdrawal.  On symptomatic treatment with PRN benzodiazepine.  Vitamin B12 deficiency: She has very low level of vitamin B12.  Started on replacement.    Hypokalemia Replace orally and recheck a.m. labs  DVT prophylaxis: Lovenox Code Status: Full code Family Communication: s no family present today Disposition Plan: Needs skilled facility in the next several days   Consultants:   Psychiatry  Procedures:   None  Antimicrobials:   None   Subjective: No new issues, complaining of pain all over mainly in the leg on left side  Objective: Vitals:   01/16/19 2330 01/17/19 0443 01/17/19 0734 01/17/19 1238  BP: (!) 147/87 115/78 139/70 (!) 143/70  Pulse: 89 86 82 92  Resp: 16 16 16 20   Temp: 98 F (36.7 C) 98.2 F (36.8 C) 98.2 F (36.8 C) 98.1 F (36.7 C)  TempSrc: Oral Oral Oral Oral  SpO2: 100% 97% 97% 99%  Weight:      Height:        Intake/Output Summary (Last 24 hours) at 01/17/2019 1415 Last data filed at 01/17/2019 01/19/2019 Gross  per 24 hour  Intake 970 ml  Output -  Net 970 ml   Filed Weights   01/15/19 0001  Weight: 53.1 kg    Examination:  Calm coherent but slightly agitated EOMI NCAT No icterus no pallor Poor dentition S1-S2 no murmur Bruises over her left lower extremity but moves all 4 limbs equally Internal/external rotation at hip is fine   Data Reviewed: I have personally reviewed  following labs and imaging studies  CBC: Recent Labs  Lab 01/14/19 1344 01/14/19 1648 01/14/19 1649 01/15/19 0509 01/16/19 0339 01/17/19 0844  WBC 8.4 7.4  --  6.5 8.0 5.3  NEUTROABS  --   --   --   --  6.1 2.9  HGB 14.0 11.8*  --  11.4* 12.3 11.7*  HCT 40.4 35.4* 34.7 35.0* 37.1 35.5*  MCV 111.3* 114.9*  --  115.9* 114.2* 114.1*  PLT 419* 379  --  349 400 398   Basic Metabolic Panel: Recent Labs  Lab 01/14/19 1350 01/14/19 1648 01/15/19 0509 01/16/19 0339 01/17/19 0844  NA 135  --  138 138 140  K 4.0  --  3.5 3.5 3.0*  CL 102  --  112* 107 107  CO2 16*  --  17* 23 23  GLUCOSE 91  --  113* 131* 113*  BUN 18  --  10 6 <5*  CREATININE 1.01* 0.82 0.77 0.75 0.57  CALCIUM 9.8  --  7.7* 8.7* 8.9  MG  --  1.5*  --  2.3  --   PHOS  --  3.0  --   --   --    GFR: Estimated Creatinine Clearance: 64.2 mL/min (by C-G formula based on SCr of 0.57 mg/dL). Liver Function Tests: Recent Labs  Lab 01/14/19 1350 01/15/19 0509 01/17/19 0844  AST 60* 45* 35  ALT 18 15 15   ALKPHOS 87 71 73  BILITOT 0.9 0.3 0.3  PROT 7.0 5.5* 5.9*  ALBUMIN 4.0 3.0* 2.9*   No results for input(s): LIPASE, AMYLASE in the last 168 hours. Recent Labs  Lab 01/17/19 0850  AMMONIA 41*   Coagulation Profile: Recent Labs  Lab 01/15/19 0509  INR 1.0   Cardiac Enzymes: Recent Labs  Lab 01/14/19 1350 01/15/19 0509 01/16/19 0339 01/17/19 0545  CKTOTAL 1,792* 1,021* 1,762* 620*   BNP (last 3 results) No results for input(s): PROBNP in the last 8760 hours. HbA1C: No results for input(s): HGBA1C in the last 72 hours. CBG: No results for input(s): GLUCAP in the last 168 hours. Lipid Profile: No results for input(s): CHOL, HDL, LDLCALC, TRIG, CHOLHDL, LDLDIRECT in the last 72 hours. Thyroid Function Tests: Recent Labs    01/14/19 1648  TSH 2.082   Anemia Panel: Recent Labs    01/14/19 1648  VITAMINB12 118*   Sepsis Labs: Recent Labs  Lab 01/14/19 1350  LATICACIDVEN 1.5     Recent Results (from the past 240 hour(s))  SARS CORONAVIRUS 2 (TAT 6-24 HRS) Nasopharyngeal Nasopharyngeal Swab     Status: None   Collection Time: 01/14/19  4:48 PM   Specimen: Nasopharyngeal Swab  Result Value Ref Range Status   SARS Coronavirus 2 NEGATIVE NEGATIVE Final    Comment: (NOTE) SARS-CoV-2 target nucleic acids are NOT DETECTED. The SARS-CoV-2 RNA is generally detectable in upper and lower respiratory specimens during the acute phase of infection. Negative results do not preclude SARS-CoV-2 infection, do not rule out co-infections with other pathogens, and should not be used as the sole basis for treatment  or other patient management decisions. Negative results must be combined with clinical observations, patient history, and epidemiological information. The expected result is Negative. Fact Sheet for Patients: SugarRoll.be Fact Sheet for Healthcare Providers: https://www.woods-mathews.com/ This test is not yet approved or cleared by the [place] FDA and  has been authorized for detection and/or diagnosis of SARS-CoV-2 by FDA under an Emergency Use Authorization (EUA). This EUA will remain  in effect (meaning this test can be used) for the duration of the COVID-19 declaration under Section 56 4(b)(1) of the Act, 21 U.S.C. section 360bbb-3(b)(1), unless the authorization is terminated or revoked sooner. Performed at [facility], [place] [address] [place], [place] [address]       Radiology Studies: No results found.   Scheduled Meds: . enoxaparin (LOVENOX) injection  40 mg Subcutaneous Q24H  . fentaNYL (SUBLIMAZE) injection  50 mcg Intravenous Once  . folic acid  1 mg Oral Daily  . lactulose  20 g Oral BID  . lamoTRIgine  50 mg Oral BID  . LORazepam  0-4 mg Intravenous Q12H   Or  . LORazepam  0-4 mg Oral Q12H  . multivitamin with minerals  1 tablet Oral Daily  . nicotine  14 mg Transdermal Daily  .  pantoprazole  40 mg Oral Daily  . potassium chloride  40 mEq Oral TID  . thiamine  100 mg Oral Daily   Or  . thiamine  100 mg Intravenous Daily  . vitamin B-12  1,000 mcg Oral Daily   Continuous Infusions: . sodium chloride 100 mL/hr at 01/15/19 2019     LOS: 2 days    Time spent: 30 minutes   Verneita Griffes, MD [facility] 2:28 PM   If 7PM-7AM, please contact night-coverage [email or web address] Password [email or web address] 01/17/2019, 2:15 PM

## 2019-01-17 NOTE — NC FL2 (Signed)
[place] LEVEL OF CARE SCREENING TOOL     IDENTIFICATION  Patient Name: Ariel White Birthdate: March 17, 1961 Sex: female Admission Date (Current Location): 01/14/2019  [facility] [place] Number:  Herbalist and Address:  [facility], [place] [address] [place], [place] [address]      Provider Number: [id number]  Attending Physician Name and Address:  Nita Sells, MD  Relative Name and Phone Number:       Current Level of Care: Hospital Recommended Level of Care: [place] Prior Approval Number:    Date Approved/Denied:   PASRR Number: [id number]  Discharge Plan: SNF    Current Diagnoses: Patient Active Problem List   Diagnosis Date Noted  . Syncope 01/15/2019  . Altered mental state 01/14/2019  . Tobacco abuse 01/14/2019  . Anxiety 01/14/2019  . Rhabdomyolysis 01/14/2019  . Macrocytosis 01/14/2019  . Status post fall 01/14/2019  . Fall   . Avascular necrosis of head of humerus ([place]) 11/14/2018  . Fibromyalgia 09/17/2015  . Myofascial pain syndrome 08/29/2015  . DDD (degenerative disc disease), cervical 08/29/2015  . Pain in joint, shoulder region 08/29/2015  . Chronic thoracic back pain 08/29/2015  . Depression, unspecified 02/01/2015  . Alcohol use disorder, mild, abuse 02/01/2015  . 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/01/2015    Orientation RESPIRATION BLADDER Height & Weight     Self, Time, Situation, Place  Normal Continent Weight: 117 lb 1 oz (53.1 kg) Height:  5\' 3"  (160 cm)  BEHAVIORAL SYMPTOMS/MOOD NEUROLOGICAL BOWEL NUTRITION STATUS      Continent Diet(soft diet)  AMBULATORY STATUS COMMUNICATION OF NEEDS Skin   Limited Assist Verbally Surgical wounds(closed incision on right shoulder)                       Personal Care Assistance Level of Assistance  Bathing, Feeding, Dressing Bathing Assistance: Limited assistance Feeding assistance:  Independent Dressing Assistance: Limited assistance     Functional Limitations Info  Sight, Hearing, Speech Sight Info: Adequate Hearing Info: Adequate Speech Info: Adequate    SPECIAL CARE FACTORS FREQUENCY  PT (By licensed PT), OT (By licensed OT)     PT Frequency: 5x OT Frequency: 5x            Contractures Contractures Info: Not present    Additional Factors Info  Code Status, Allergies Code Status Info: Full Code Allergies Info: Compazine (Prochlorperazine Edisylate), Ivp Dye (Iodinated Diagnostic Agents), Baclofen, Imitrex (Sumatriptan), Voltaren (Diclofenac)           Current Medications (01/17/2019):  This is the current hospital active medication list Current Facility-Administered Medications  Medication Dose Route Frequency Provider Last Rate Last Dose  . 0.9 %  sodium chloride infusion   Intravenous Continuous Alma Friendly, MD 100 mL/hr at 01/15/19 2019    . acetaminophen (TYLENOL) tablet 650 mg  650 mg Oral Q6H PRN Arby Barrette A, NP   650 mg at 01/15/19 0023  . enoxaparin (LOVENOX) injection 40 mg  40 mg Subcutaneous Q24H Pahwani, Rinka R, MD   40 mg at 01/16/19 2111  . fentaNYL (SUBLIMAZE) injection 50 mcg  50 mcg Intravenous Once Tegeler, Gwenyth Allegra, MD      . folic acid (FOLVITE) tablet 1 mg  1 mg Oral Daily Pahwani, Rinka R, MD   1 mg at 01/17/19 0913  . HYDROcodone-acetaminophen (NORCO/VICODIN) 5-325 MG per tablet 1 tablet  1 tablet Oral Q4H PRN Ezenduka,  Monica Martinez, MD   1 tablet at 01/17/19 0913  . lamoTRIgine (LAMICTAL) tablet 50 mg  50 mg Oral BID Pahwani, Rinka R, MD   50 mg at 01/17/19 0914  . LORazepam (ATIVAN) injection 0-4 mg  0-4 mg Intravenous Q12H Sabas Sous, MD   2 mg at 01/16/19 2341   Or  . LORazepam (ATIVAN) tablet 0-4 mg  0-4 mg Oral Q12H Sabas Sous, MD      . multivitamin with minerals tablet 1 tablet  1 tablet Oral Daily Pahwani, Rinka R, MD   1 tablet at 01/17/19 0913  . nicotine (NICODERM CQ - dosed in  mg/24 hours) patch 14 mg  14 mg Transdermal Daily Pahwani, Rinka R, MD      . ondansetron (ZOFRAN) injection 4 mg  4 mg Intravenous Q6H PRN Pahwani, Rinka R, MD      . pantoprazole (PROTONIX) EC tablet 40 mg  40 mg Oral Daily Pahwani, Rinka R, MD   40 mg at 01/17/19 0912  . thiamine (VITAMIN B-1) tablet 100 mg  100 mg Oral Daily Sabas Sous, MD   100 mg at 01/17/19 7510   Or  . thiamine (B-1) injection 100 mg  100 mg Intravenous Daily Sabas Sous, MD   100 mg at 01/14/19 1616  . vitamin B-12 (CYANOCOBALAMIN) tablet 1,000 mcg  1,000 mcg Oral Daily Briant Cedar, MD   1,000 mcg at 01/17/19 2585     Discharge Medications: Please see discharge summary for a list of discharge medications.  Relevant Imaging Results:  Relevant Lab Results:   Additional Information SSN: [id number]  Maree Krabbe, LCSW

## 2019-01-18 LAB — COMPREHENSIVE METABOLIC PANEL
ALT: 18 U/L (ref 0–44)
AST: 37 U/L (ref 15–41)
Albumin: 3.5 g/dL (ref 3.5–5.0)
Alkaline Phosphatase: 89 U/L (ref 38–126)
Anion gap: 9 (ref 5–15)
BUN: 8 mg/dL (ref 6–20)
CO2: 25 mmol/L (ref 22–32)
Calcium: 9.3 mg/dL (ref 8.9–10.3)
Chloride: 107 mmol/L (ref 98–111)
Creatinine, Ser: 0.73 mg/dL (ref 0.44–1.00)
GFR calc Af Amer: >60 mL/min (ref >60)
GFR calc non Af Amer: >60 mL/min (ref >60)
Glucose, Bld: 110 mg/dL — ABNORMAL HIGH (ref 70–99)
Potassium: 4.4 mmol/L (ref 3.5–5.1)
Sodium: 141 mmol/L (ref 135–145)
Total Bilirubin: 0.2 mg/dL — ABNORMAL LOW (ref 0.3–1.2)
Total Protein: 6.9 g/dL (ref 6.5–8.1)

## 2019-01-18 LAB — CK: Total CK: 374 U/L — ABNORMAL HIGH (ref 38–234)

## 2019-01-18 LAB — MAGNESIUM: Magnesium: 1.9 mg/dL (ref 1.7–2.4)

## 2019-01-18 MED ORDER — OXYCODONE HCL 5 MG PO TABS
5.0000 mg | ORAL_TABLET | Freq: Four times a day (QID) | ORAL | Status: DC | PRN
  Administered 2019-01-18 (×2): 10 mg via ORAL
  Administered 2019-01-19: 5 mg via ORAL
  Administered 2019-01-19 (×2): 10 mg via ORAL
  Administered 2019-01-19 – 2019-01-20 (×3): 5 mg via ORAL
  Administered 2019-01-20 – 2019-01-22 (×8): 10 mg via ORAL
  Filled 2019-01-18: qty 2
  Filled 2019-01-18: qty 1
  Filled 2019-01-18 (×14): qty 2

## 2019-01-18 MED ORDER — OXYCODONE HCL 5 MG PO TABS
10.0000 mg | ORAL_TABLET | Freq: Once | ORAL | Status: AC
  Administered 2019-01-18: 10 mg via ORAL
  Filled 2019-01-18: qty 2

## 2019-01-18 NOTE — Plan of Care (Signed)
Progressing towards goals

## 2019-01-18 NOTE — Progress Notes (Signed)
PROGRESS NOTE    Ariel White  XTK:[id number] DOB: 02/12/1961 DOA: 01/14/2019 PCP: Carren Rang, PA-C    Brief Narrative:   58 y.o.white femalebipolar disorder, depression/anxiety, tobacco abuse, alcohol abuse, GERD brought by EMS to emergency department due to concern of altered mental status.   Patient reports that she was trying to get out of the bed in the morning to go to bathroom,she fell and hit on head and passed out. - found herself in the vomitus. She had to drag herself to the kitchen as her phone was on the kitchen table,she called 911 and brought to the emergency department for further evaluation and management.   Complaining of headache, right shoulder and left leg pain. She has multiple bruises on her right eye, nose and both legs. She reports that she smokes 5 cigarettes/day-trying to quit and drinks alcohol only on the weekends (although reports drinks to help her sleep). Denies illicit drug use. She lives alone and independent of daily life activities does not use walker or cane for ambulation. She denies previous history of alcohol withdrawal.   In the ED, CK level: 1792. Ethanol: Undetectable, troponin x1-. Lactic acid: WNL. Imaging such as chest x-ray, pelvic x-ray, left foot, left tibia-fibula, right humerus, left femur, right shoulder, CT abdomen/pelvis, CT chest, CT maxillofacial, CT cervical spine and CT head all came back negative for acute findings.   Assessment & Plan:   Principal Problem:   Altered mental state Active Problems:   Depression, unspecified   Alcohol use disorder, mild, abuse   Tobacco abuse   Anxiety   Rhabdomyolysis   Macrocytosis   Status post fall   Syncope  Acute metabolic encephalopathy:  Alcohol level undetectable, lactic acid normal, troponin negative, EKG normal.  Slightly elevated CK levels.  UA negative.  EEG normal.  TSH normal.  UDS positive for benzo and opiates likely from ER administration. Ammonia slightly  elevated starting lactulose twice daily 9/23-monitor Psychiatry has seen the patient, recommends Zyprexa 2.5 twice daily EEG 9/21 cortical frontotemporal dysfunction no epileptiform issues Occasionally drinks alcohol as per sister, keep on low dose benzo PRN.   Fall with multiple bruises: No skeletal injury on survey.  With deconditioning.  PT OT recommending skilled care placement with rolling walker wheels We will ask Occupational Therapy to see the patient because of immobility of right shoulder complex secondary to recent surgery-monitor  Rhabdomyolysis: CK level trending downwards.  Remains on IV fluids.  Renal functions normal.  Recheck in a.m.  Alcohol abuse: Less likely alcohol withdrawal.  DC protocol for alcohol withdrawal on 9/23  Vitamin B12 deficiency: She has very low level of vitamin B12.  Started on replacement.    Hypokalemia Currently stable at this time-discontinue replacement with K. Dur 40 at this time and monitor  DVT prophylaxis: Lovenox Code Status: Full code Family Communication: s no family present today Disposition Plan: Needs skilled facility in the next several days   Consultants:   Psychiatry  Procedures:   None  Antimicrobials:   None   Subjective: Overall is well Complaining of right shoulder pain Bandage left side was replaced   Objective: Vitals:   01/18/19 0021 01/18/19 0416 01/18/19 0918 01/18/19 1109  BP: (!) 151/66 (!) 144/80 129/68 139/75  Pulse: 81 99 88 89  Resp: 17 17 17 17   Temp: 98.4 F (36.9 C) 97.8 F (36.6 C) 98.1 F (36.7 C) (!) 97.5 F (36.4 C)  TempSrc: Oral Oral Oral Oral  SpO2: 100% 100% 99% 100%  Weight:      Height:        Intake/Output Summary (Last 24 hours) at 01/18/2019 1416 Last data filed at 01/18/2019 1300 Gross per 24 hour  Intake 902 ml  Output -  Net 902 ml   Filed Weights   01/15/19 0001  Weight: 53.1 kg    Examination:  No overt changes to prior exams when  Calm coherent but  slightly agitated EOMI NCAT No icterus no pallor Poor dentition S1-S2 no murmur Bruises over her left lower extremity but moves all 4 limbs equally Internal/external rotation at hip is fine   Data Reviewed: I have personally reviewed following labs and imaging studies  CBC: Recent Labs  Lab 01/14/19 1344 01/14/19 1648 01/14/19 1649 01/15/19 0509 01/16/19 0339 01/17/19 0844  WBC 8.4 7.4  --  6.5 8.0 5.3  NEUTROABS  --   --   --   --  6.1 2.9  HGB 14.0 11.8*  --  11.4* 12.3 11.7*  HCT 40.4 35.4* 34.7 35.0* 37.1 35.5*  MCV 111.3* 114.9*  --  115.9* 114.2* 114.1*  PLT 419* 379  --  349 400 398   Basic Metabolic Panel: Recent Labs  Lab 01/14/19 1350 01/14/19 1648 01/15/19 0509 01/16/19 0339 01/17/19 0844 01/18/19 0334  NA 135  --  138 138 140 141  K 4.0  --  3.5 3.5 3.0* 4.4  CL 102  --  112* 107 107 107  CO2 16*  --  17* 23 23 25   GLUCOSE 91  --  113* 131* 113* 110*  BUN 18  --  10 6 <5* 8  CREATININE 1.01* 0.82 0.77 0.75 0.57 0.73  CALCIUM 9.8  --  7.7* 8.7* 8.9 9.3  MG  --  1.5*  --  2.3  --  1.9  PHOS  --  3.0  --   --   --   --    GFR: Estimated Creatinine Clearance: 64.2 mL/min (by C-G formula based on SCr of 0.73 mg/dL). Liver Function Tests: Recent Labs  Lab 01/14/19 1350 01/15/19 0509 01/17/19 0844 01/18/19 0334  AST 60* 45* 35 37  ALT 18 15 15 18   ALKPHOS 87 71 73 89  BILITOT 0.9 0.3 0.3 0.2*  PROT 7.0 5.5* 5.9* 6.9  ALBUMIN 4.0 3.0* 2.9* 3.5   No results for input(s): LIPASE, AMYLASE in the last 168 hours. Recent Labs  Lab 01/17/19 0850  AMMONIA 41*   Coagulation Profile: Recent Labs  Lab 01/15/19 0509  INR 1.0   Cardiac Enzymes: Recent Labs  Lab 01/14/19 1350 01/15/19 0509 01/16/19 0339 01/17/19 0545 01/18/19 0334  CKTOTAL 1,792* 1,021* 1,762* 620* 374*   BNP (last 3 results) No results for input(s): PROBNP in the last 8760 hours. HbA1C: No results for input(s): HGBA1C in the last 72 hours. CBG: No results for input(s):  GLUCAP in the last 168 hours. Lipid Profile: No results for input(s): CHOL, HDL, LDLCALC, TRIG, CHOLHDL, LDLDIRECT in the last 72 hours. Thyroid Function Tests: No results for input(s): TSH, T4TOTAL, FREET4, T3FREE, THYROIDAB in the last 72 hours. Anemia Panel: No results for input(s): VITAMINB12, FOLATE, FERRITIN, TIBC, IRON, RETICCTPCT in the last 72 hours. Sepsis Labs: Recent Labs  Lab 01/14/19 1350  LATICACIDVEN 1.5    Recent Results (from the past 240 hour(s))  SARS CORONAVIRUS 2 (TAT 6-24 HRS) Nasopharyngeal Nasopharyngeal Swab     Status: None   Collection Time: 01/14/19  4:48 PM   Specimen: Nasopharyngeal Swab  Result Value  Ref Range Status   SARS Coronavirus 2 NEGATIVE NEGATIVE Final    Comment: (NOTE) SARS-CoV-2 target nucleic acids are NOT DETECTED. The SARS-CoV-2 RNA is generally detectable in upper and lower respiratory specimens during the acute phase of infection. Negative results do not preclude SARS-CoV-2 infection, do not rule out co-infections with other pathogens, and should not be used as the sole basis for treatment or other patient management decisions. Negative results must be combined with clinical observations, patient history, and epidemiological information. The expected result is Negative. Fact Sheet for Patients: SugarRoll.be Fact Sheet for Healthcare Providers: https://www.woods-mathews.com/ This test is not yet approved or cleared by the [place] FDA and  has been authorized for detection and/or diagnosis of SARS-CoV-2 by FDA under an Emergency Use Authorization (EUA). This EUA will remain  in effect (meaning this test can be used) for the duration of the COVID-19 declaration under Section 56 4(b)(1) of the Act, 21 U.S.C. section 360bbb-3(b)(1), unless the authorization is terminated or revoked sooner. Performed at [facility], [place] [address], [place], [place] [address]       Radiology  Studies: No results found.   Scheduled Meds: . enoxaparin (LOVENOX) injection  40 mg Subcutaneous Q24H  . fentaNYL (SUBLIMAZE) injection  50 mcg Intravenous Once  . folic acid  1 mg Oral Daily  . lactulose  20 g Oral BID  . lamoTRIgine  50 mg Oral BID  . multivitamin with minerals  1 tablet Oral Daily  . nicotine  14 mg Transdermal Daily  . OLANZapine  2.5 mg Oral BID  . pantoprazole  40 mg Oral Daily  . potassium chloride  40 mEq Oral TID  . vitamin B-12  1,000 mcg Oral Daily   Continuous Infusions: . sodium chloride 100 mL/hr at 01/15/19 2019     LOS: 3 days    Time spent: 30 minutes   Verneita Griffes, MD [facility] 2:16 PM   If 7PM-7AM, please contact night-coverage [email or web address] Password [email or web address] 01/18/2019, 2:16 PM

## 2019-01-18 NOTE — TOC Progression Note (Signed)
Transition of Care [facility]) - Progression Note    Patient Details  Name: Ariel White MRN: [id number] Date of Birth: [birth year]  Transition of Care [facility]) CM/SW [place], [place] Phone Number: 01/18/2019, 12:54 PM  Clinical Narrative:   CSW following for discharge plans. Patient has no SNF bed offers in the [place] area, so CSW faxed referral out to [place] area. CSW reached out to pending bed offer at [facility] to see if they could offer on the patient, awaiting response.    Expected Discharge Plan: Skilled Nursing Facility Barriers to Discharge: SNF Pending bed offer, Insurance Authorization  Expected Discharge Plan and Services Expected Discharge Plan: [place] In-house Referral: NA   Post Acute Care Choice: [place] Living arrangements for the past 2 months: Single Family Home                                       Social Determinants of Health (SDOH) Interventions    Readmission Risk Interventions No flowsheet data found.

## 2019-01-18 NOTE — Care Management Important Message (Signed)
Important Message  Patient Details  Name: Ariel White MRN: [id number] Date of Birth: [birth year]   Medicare Important Message Given:  Yes     Shelda Altes 01/18/2019, 3:41 PM

## 2019-01-18 NOTE — Progress Notes (Signed)
Physical Therapy Treatment Patient Details Name: Ariel White MRN: [id number] DOB: 1960/07/18 Today's Date: 01/18/2019    History of Present Illness 58 y.o. female presenting with AMS as well as fall in bathroom and hit her head. CT of head showing no acute abnormalities. PMH including bipolar disorder, depression/anxiety, tobacco abuse, alcohol abuse, R shoulder hemiarthroplasty, and GERD.    PT Comments    Patient seen for mobility progression. Pt is making progress toward PT goals and reports less pain overall today vs previous PT session. Pt c/o L groin pain with mobility. Continue to progress as tolerated with anticipated d/c to SNF for further skilled PT services.     Follow Up Recommendations  SNF;Supervision/Assistance - 24 hour     Equipment Recommendations  Rolling walker with 5" wheels    Recommendations for Other Services       Precautions / Restrictions Precautions Precautions: Fall    Mobility  Bed Mobility Overal bed mobility: Needs Assistance Bed Mobility: Sit to Supine;Supine to Sit     Supine to sit: Supervision Sit to supine: Supervision   General bed mobility comments: supervision for safety; increased time and effort  Transfers Overall transfer level: Needs assistance Equipment used: 1 person hand held assist Transfers: Sit to/from Stand Sit to Stand: Min assist         General transfer comment: assist to steady  Ambulation/Gait Ambulation/Gait assistance: Min assist;+2 safety/equipment;+2 physical assistance Gait Distance (Feet): 120 Feet Assistive device: 2 person hand held assist;1 person hand held assist Gait Pattern/deviations: Step-through pattern;Decreased stride length;Trunk flexed Gait velocity: decreased   General Gait Details: initially pt ambulating with +2 HHA and then with +1 HHA; requires assistance for balance; pt with decreased cadence/stride length and impaired balance   Stairs             Wheelchair Mobility     Modified Rankin (Stroke Patients Only)       Balance Overall balance assessment: Needs assistance Sitting-balance support: Feet supported Sitting balance-Leahy Scale: Good     Standing balance support: During functional activity;Single extremity supported Standing balance-Leahy Scale: Poor Standing balance comment: Reliant on UE support or physical A                            Cognition Arousal/Alertness: Awake/alert Behavior During Therapy: WFL for tasks assessed/performed Overall Cognitive Status: Within Functional Limits for tasks assessed                                        Exercises      General Comments        Pertinent Vitals/Pain Pain Assessment: Faces Faces Pain Scale: Hurts little more Pain Location: L groin area Pain Descriptors / Indicators: Sore Pain Intervention(s): Monitored during session;Repositioned    Home Living                      Prior Function            PT Goals (current goals can now be found in the care plan section) Acute Rehab PT Goals Patient Stated Goal: "Stop all this pain and get better" Progress towards PT goals: Progressing toward goals    Frequency    Min 2X/week      PT Plan Current plan remains appropriate    Co-evaluation  AM-PAC PT "6 Clicks" Mobility   Outcome Measure  Help needed turning from your back to your side while in a flat bed without using bedrails?: None Help needed moving from lying on your back to sitting on the side of a flat bed without using bedrails?: A Little Help needed moving to and from a bed to a chair (including a wheelchair)?: A Little Help needed standing up from a chair using your arms (e.g., wheelchair or bedside chair)?: A Little Help needed to walk in hospital room?: A Little Help needed climbing 3-5 steps with a railing? : A Lot 6 Click Score: 18    End of Session Equipment Utilized During Treatment: Gait  belt Activity Tolerance: Patient tolerated treatment well Patient left: in bed;with call bell/phone within reach;with bed alarm set Nurse Communication: Mobility status PT Visit Diagnosis: Unsteadiness on feet (R26.81);Repeated falls (R29.6);Difficulty in walking, not elsewhere classified (R26.2);Pain;Muscle weakness (generalized) (M62.81) Pain - Right/Left: (generalized) Pain - part of body: (generalized)     Time: 7616-0737 PT Time Calculation (min) (ACUTE ONLY): 19 min  Charges:  $Gait Training: 8-22 mins                     Erline Levine, PTA Acute Rehabilitation Services Pager: [phone] Office: [phone]     Carolynne Edouard 01/18/2019, 5:01 PM

## 2019-01-19 LAB — BASIC METABOLIC PANEL
Anion gap: 8 (ref 5–15)
BUN: 12 mg/dL (ref 6–20)
CO2: 28 mmol/L (ref 22–32)
Calcium: 9.4 mg/dL (ref 8.9–10.3)
Chloride: 102 mmol/L (ref 98–111)
Creatinine, Ser: 0.79 mg/dL (ref 0.44–1.00)
GFR calc Af Amer: >60 mL/min (ref >60)
GFR calc non Af Amer: >60 mL/min (ref >60)
Glucose, Bld: 95 mg/dL (ref 70–99)
Potassium: 4.5 mmol/L (ref 3.5–5.1)
Sodium: 138 mmol/L (ref 135–145)

## 2019-01-19 LAB — MAGNESIUM: Magnesium: 2 mg/dL (ref 1.7–2.4)

## 2019-01-19 MED ORDER — IBUPROFEN 200 MG PO TABS
400.0000 mg | ORAL_TABLET | Freq: Three times a day (TID) | ORAL | Status: DC
  Administered 2019-01-19 – 2019-01-22 (×10): 400 mg via ORAL
  Filled 2019-01-19 (×11): qty 2

## 2019-01-19 NOTE — Progress Notes (Addendum)
PROGRESS NOTE    Ariel White  JKD:[id number] DOB: 1960/12/11 DOA: 01/14/2019 PCP: Carren Rang, PA-C    Brief Narrative:   69 WFbipolar disorder, depression/anxiety, tobacco abuse, alcohol abuse, GERD brought by EMS to emergency department due to concern of altered mental status.   Patient reports that she was trying to get out of the bed in the morning to go to bathroom,she fell and hit on head and passed out. - found herself in the vomitus. She had to drag herself to the kitchen as her phone was on the kitchen table,she called 911 and brought to the emergency department for further evaluation and management.   Complaining of headache, right shoulder and left leg pain. She has multiple bruises on her right eye, nose and both legs. She reports that she smokes 5 cigarettes/day-trying to quit and drinks alcohol only on the weekends (although reports drinks to help her sleep). Denies illicit drug use. She lives alone and independent of daily life activities does not use walker or cane for ambulation. She denies previous history of alcohol withdrawal.   In the ED, CK level: 1792. Ethanol: Undetectable, troponin x1-. Lactic acid: WNL. Imaging such as chest x-ray, pelvic x-ray, left foot, left tibia-fibula, right humerus, left femur, right shoulder, CT abdomen/pelvis, CT chest, CT maxillofacial, CT cervical spine and CT head all came back negative for acute findings.   Assessment & Plan:   Principal Problem:   Altered mental state Active Problems:   Depression, unspecified   Alcohol use disorder, mild, abuse   Tobacco abuse   Anxiety   Rhabdomyolysis   Macrocytosis   Status post fall   Syncope  Acute metabolic encephalopathy:  Alcohol level undetectable, lactic acid normal, troponin negative, EKG normal.  Slightly elevated CK levels.  UA negative.  EEG normal.  TSH normal.  UDS positive for benzo and opiates likely from ER administration. Ammonia slightly elevated starting  lactulose twice daily 9/23-monitor Psychiatry has seen the patient, recommends Zyprexa 2.5 twice daily--continue Lamictal 50 bid EEG 9/21 cortical frontotemporal dysfunction no epileptiform issues ?drinks alcohol as per sister, keep on low dose benzo PRN.   Fall with multiple bruises: No skeletal injury on survey.  With deconditioning.  PT OT recommending skilled care placement with rolling walker wheels OT to see the patient because of immobility of right shoulder complex secondary to recent surgery-monitor  Rhabdomyolysis: CK level trending downwards.  D/c IVF  Alcohol abuse: Less likely alcohol withdrawal.  DC protocol for alcohol withdrawal on 9/23  Vitamin B12 deficiency: She has very low level of vitamin B12.  Started on replacement.    Hypokalemia Currently stable at this time-discontinue replacement with K. Dur 40 at this time and monitor  DVT prophylaxis: Lovenox Code Status: Full code Family Communication: called and updated sister re: POC Disposition Plan: Needs skilled facility once can be arranged by CSW   Consultants:   Psychiatry  Procedures:   None  Antimicrobials:   None   Subjective:  Fair no distress but emotional no cp no fever ROM to R shoulder is limited No fever  Objective: Vitals:   01/19/19 0003 01/19/19 0348 01/19/19 0854 01/19/19 1210  BP: 128/67 137/64 (!) 159/56 130/67  Pulse: 93 91 94 97  Resp: 18 18 17 20   Temp: 98.2 F (36.8 C) 98.3 F (36.8 C) 98.2 F (36.8 C) 98.1 F (36.7 C)  TempSrc: Oral Oral Oral Oral  SpO2: 99% 98% 96% 96%  Weight:      Height:  Intake/Output Summary (Last 24 hours) at 01/19/2019 1518 Last data filed at 01/19/2019 1212 Gross per 24 hour  Intake 940 ml  Output -  Net 940 ml   Filed Weights   01/15/19 0001  Weight: 53.1 kg    Examination:  slightly agitated EOMI NCAT-glasses No icterus no pallor mod dentition S1-S2 no murmur Bruises over her left lower extremity but moves all 4  limbs equally except RUE Internal/external rotation at hip is fine   Data Reviewed: I have personally reviewed following labs and imaging studies  CBC: Recent Labs  Lab 01/14/19 1344 01/14/19 1648 01/14/19 1649 01/15/19 0509 01/16/19 0339 01/17/19 0844  WBC 8.4 7.4  --  6.5 8.0 5.3  NEUTROABS  --   --   --   --  6.1 2.9  HGB 14.0 11.8*  --  11.4* 12.3 11.7*  HCT 40.4 35.4* 34.7 35.0* 37.1 35.5*  MCV 111.3* 114.9*  --  115.9* 114.2* 114.1*  PLT 419* 379  --  349 400 398   Basic Metabolic Panel: Recent Labs  Lab 01/14/19 1648 01/15/19 0509 01/16/19 0339 01/17/19 0844 01/18/19 0334 01/19/19 0402  NA  --  138 138 140 141 138  K  --  3.5 3.5 3.0* 4.4 4.5  CL  --  112* 107 107 107 102  CO2  --  17* 23 23 25 28   GLUCOSE  --  113* 131* 113* 110* 95  BUN  --  10 6 <5* 8 12  CREATININE 0.82 0.77 0.75 0.57 0.73 0.79  CALCIUM  --  7.7* 8.7* 8.9 9.3 9.4  MG 1.5*  --  2.3  --  1.9 2.0  PHOS 3.0  --   --   --   --   --    GFR: Estimated Creatinine Clearance: 64.2 mL/min (by C-G formula based on SCr of 0.79 mg/dL). Liver Function Tests: Recent Labs  Lab 01/14/19 1350 01/15/19 0509 01/17/19 0844 01/18/19 0334  AST 60* 45* 35 37  ALT 18 15 15 18   ALKPHOS 87 71 73 89  BILITOT 0.9 0.3 0.3 0.2*  PROT 7.0 5.5* 5.9* 6.9  ALBUMIN 4.0 3.0* 2.9* 3.5   No results for input(s): LIPASE, AMYLASE in the last 168 hours. Recent Labs  Lab 01/17/19 0850  AMMONIA 41*   Coagulation Profile: Recent Labs  Lab 01/15/19 0509  INR 1.0   Cardiac Enzymes: Recent Labs  Lab 01/14/19 1350 01/15/19 0509 01/16/19 0339 01/17/19 0545 01/18/19 0334  CKTOTAL 1,792* 1,021* 1,762* 620* 374*   BNP (last 3 results) No results for input(s): PROBNP in the last 8760 hours. HbA1C: No results for input(s): HGBA1C in the last 72 hours. CBG: No results for input(s): GLUCAP in the last 168 hours. Lipid Profile: No results for input(s): CHOL, HDL, LDLCALC, TRIG, CHOLHDL, LDLDIRECT in the last 72  hours. Thyroid Function Tests: No results for input(s): TSH, T4TOTAL, FREET4, T3FREE, THYROIDAB in the last 72 hours. Anemia Panel: No results for input(s): VITAMINB12, FOLATE, FERRITIN, TIBC, IRON, RETICCTPCT in the last 72 hours. Sepsis Labs: Recent Labs  Lab 01/14/19 1350  LATICACIDVEN 1.5    Recent Results (from the past 240 hour(s))  SARS CORONAVIRUS 2 (TAT 6-24 HRS) Nasopharyngeal Nasopharyngeal Swab     Status: None   Collection Time: 01/14/19  4:48 PM   Specimen: Nasopharyngeal Swab  Result Value Ref Range Status   SARS Coronavirus 2 NEGATIVE NEGATIVE Final    Comment: (NOTE) SARS-CoV-2 target nucleic acids are NOT DETECTED. The SARS-CoV-2  RNA is generally detectable in upper and lower respiratory specimens during the acute phase of infection. Negative results do not preclude SARS-CoV-2 infection, do not rule out co-infections with other pathogens, and should not be used as the sole basis for treatment or other patient management decisions. Negative results must be combined with clinical observations, patient history, and epidemiological information. The expected result is Negative. Fact Sheet for Patients: SugarRoll.be Fact Sheet for Healthcare Providers: https://www.woods-mathews.com/ This test is not yet approved or cleared by the [place] FDA and  has been authorized for detection and/or diagnosis of SARS-CoV-2 by FDA under an Emergency Use Authorization (EUA). This EUA will remain  in effect (meaning this test can be used) for the duration of the COVID-19 declaration under Section 56 4(b)(1) of the Act, 21 U.S.C. section 360bbb-3(b)(1), unless the authorization is terminated or revoked sooner. Performed at [facility], [place] [address], [place], [place] [address]     Radiology Studies: No results found.  Scheduled Meds: . enoxaparin (LOVENOX) injection  40 mg Subcutaneous Q24H  . fentaNYL (SUBLIMAZE)  injection  50 mcg Intravenous Once  . folic acid  1 mg Oral Daily  . ibuprofen  400 mg Oral TID  . lactulose  20 g Oral BID  . lamoTRIgine  50 mg Oral BID  . multivitamin with minerals  1 tablet Oral Daily  . nicotine  14 mg Transdermal Daily  . OLANZapine  2.5 mg Oral BID  . pantoprazole  40 mg Oral Daily  . vitamin B-12  1,000 mcg Oral Daily    LOS: 4 days  Time spent: 20 minutes  Verneita Griffes, MD [facility] 3:18 PM   If 7PM-7AM, please contact night-coverage [email or web address] Password [facility] 01/19/2019, 3:18 PM

## 2019-01-19 NOTE — TOC Progression Note (Signed)
Transition of Care [facility]) - Progression Note    Patient Details  Name: Ariel White MRN: [id number] Date of Birth: [birth year]  Transition of Care [facility]) CM/SW [place], [place] Phone Number: 01/19/2019, 10:16 AM  Clinical Narrative:   CSW contacted by [facility] that they are able to make a bed offer on the patient. [employer] starting authorization with [employer]. CSW to follow.    Expected Discharge Plan: Skilled Nursing Facility Barriers to Discharge: SNF Pending bed offer, Insurance Authorization  Expected Discharge Plan and Services Expected Discharge Plan: [place] In-house Referral: NA   Post Acute Care Choice: [place] Living arrangements for the past 2 months: Single Family Home                                       Social Determinants of Health (SDOH) Interventions    Readmission Risk Interventions No flowsheet data found.

## 2019-01-19 NOTE — Progress Notes (Signed)
Occupational Therapy Treatment Patient Details Name: Ariel White MRN: [id number] DOB: October 29, 1960 Today's Date: 01/19/2019    History of present illness 58 y.o. female presenting with AMS as well as fall in bathroom and hit her head. CT of head showing no acute abnormalities. PMH including bipolar disorder, depression/anxiety, tobacco abuse, alcohol abuse, R shoulder hemiarthroplasty, and GERD.   OT comments  Pt performing OOB ADL at sink with supervisionA. Pt performing gentle ROM to R shoulder in most planes listed below to shoulder flex to 110 degrees. Pt tolerating session well. RUE AAROM education performed for thumb up and elbow straight for exercises with LUE assisting RUE. Pt positioned for comfort in recliner. Pt only lasted about 20 mins in chair before her call light when on for getting OOB. Pt would benefit from continued OT skilled services for ADL, mobility and safety. OT following acutely.    Follow Up Recommendations  SNF;Supervision/Assistance - 24 hour    Equipment Recommendations  Other (comment)(to be determined)    Recommendations for Other Services      Precautions / Restrictions Precautions Precautions: Fall Restrictions Weight Bearing Restrictions: No LLE Weight Bearing: Weight bearing as tolerated       Mobility Bed Mobility Overal bed mobility: Needs Assistance Bed Mobility: Supine to Sit     Supine to sit: Supervision     General bed mobility comments: supervision for safety; increased time and effort  Transfers Overall transfer level: Needs assistance Equipment used: Rolling walker (2 wheeled) Transfers: Sit to/from Stand Sit to Stand: Min guard         General transfer comment: assist to steady    Balance Overall balance assessment: Needs assistance Sitting-balance support: Feet supported Sitting balance-Leahy Scale: Good     Standing balance support: During functional activity;Single extremity supported Standing balance-Leahy  Scale: Poor Standing balance comment: Reliant on UE support or physical A                           ADL either performed or assessed with clinical judgement   ADL Overall ADL's : Needs assistance/impaired     Grooming: Min guard;Standing Grooming Details (indicate cue type and reason): standing at sink for ADL tasks.                             Functional mobility during ADLs: Min guard;Cueing for safety;Rolling walker;Cueing for sequencing General ADL Comments: Pt presenting with decreased balance, strength, and activity tolerance due to pain.      Vision   Additional Comments: wears 2 glasses   Perception     Praxis      Cognition Arousal/Alertness: Awake/alert Behavior During Therapy: WFL for tasks assessed/performed Overall Cognitive Status: Within Functional Limits for tasks assessed                                          Exercises Exercises: Other exercises Other Exercises Other Exercises: Shoulder flex, ext, abduction, elbow through hands, x5 reps,1 set   Shoulder Instructions       General Comments      Pertinent Vitals/ Pain       Pain Assessment: Faces Faces Pain Scale: Hurts little more Pain Location: R shoulder Pain Descriptors / Indicators: Sore Pain Intervention(s): Monitored during session  Home Living  Prior Functioning/Environment              Frequency  Min 2X/week        Progress Toward Goals  OT Goals(current goals can now be found in the care plan section)  Progress towards OT goals: Progressing toward goals  Acute Rehab OT Goals Patient Stated Goal: Move by R arm so it does not get frozen OT Goal Formulation: With patient Time For Goal Achievement: 01/29/19 Potential to Achieve Goals: Good ADL Goals Pt Will Perform Grooming: standing;with set-up;with supervision Pt Will Perform Lower Body Dressing: with set-up;with  supervision;sit to/from stand Pt Will Transfer to Toilet: with set-up;with supervision;ambulating;bedside commode Pt Will Perform Toileting - Clothing Manipulation and hygiene: sit to/from stand;sitting/lateral leans;with modified independence  Plan Discharge plan remains appropriate    Co-evaluation                 AM-PAC OT "6 Clicks" Daily Activity     Outcome Measure   Help from another person eating meals?: None Help from another person taking care of personal grooming?: A Little Help from another person toileting, which includes using toliet, bedpan, or urinal?: A Little Help from another person bathing (including washing, rinsing, drying)?: A Little Help from another person to put on and taking off regular upper body clothing?: A Little Help from another person to put on and taking off regular lower body clothing?: A Lot 6 Click Score: 18    End of Session Equipment Utilized During Treatment: Rolling walker;Gait belt  OT Visit Diagnosis: Unsteadiness on feet (R26.81);Other abnormalities of gait and mobility (R26.89);Muscle weakness (generalized) (M62.81);Pain Pain - Right/Left: Right Pain - part of body: Shoulder   Activity Tolerance Patient limited by pain   Patient Left in chair;with call bell/phone within reach;with chair alarm set   Nurse Communication Mobility status        Time: 6734-1937 OT Time Calculation (min): 22 min  Charges: OT General Charges $OT Visit: 1 Visit OT Treatments $Self Care/Home Management : 8-22 mins  Cristi Loron) Glendell Docker OTR/L Acute Rehabilitation Services Pager: [phone] Office: [phone]    Lonzo Cloud 01/19/2019, 3:48 PM

## 2019-01-20 ENCOUNTER — Other Ambulatory Visit: Payer: Self-pay

## 2019-01-20 LAB — CBC WITH DIFFERENTIAL/PLATELET
Abs Immature Granulocytes: 0.01 10*3/uL (ref 0.00–0.07)
Basophils Absolute: 0.1 10*3/uL (ref 0.0–0.1)
Basophils Relative: 1 %
Eosinophils Absolute: 0.4 10*3/uL (ref 0.0–0.5)
Eosinophils Relative: 7 %
HCT: 37 % (ref 36.0–46.0)
Hemoglobin: 12 g/dL (ref 12.0–15.0)
Immature Granulocytes: 0 %
Lymphocytes Relative: 30 %
Lymphs Abs: 1.8 10*3/uL (ref 0.7–4.0)
MCH: 37.5 pg — ABNORMAL HIGH (ref 26.0–34.0)
MCHC: 32.4 g/dL (ref 30.0–36.0)
MCV: 115.6 fL — ABNORMAL HIGH (ref 80.0–100.0)
Monocytes Absolute: 0.7 10*3/uL (ref 0.1–1.0)
Monocytes Relative: 12 %
Neutro Abs: 3 10*3/uL (ref 1.7–7.7)
Neutrophils Relative %: 50 %
Platelets: 462 10*3/uL — ABNORMAL HIGH (ref 150–400)
RBC: 3.2 MIL/uL — ABNORMAL LOW (ref 3.87–5.11)
RDW: 13.2 % (ref 11.5–15.5)
WBC: 5.9 10*3/uL (ref 4.0–10.5)
nRBC: 0 % (ref 0.0–0.2)

## 2019-01-20 LAB — GLUCOSE, CAPILLARY: Glucose-Capillary: 128 mg/dL — ABNORMAL HIGH (ref 70–99)

## 2019-01-20 NOTE — Progress Notes (Signed)
PROGRESS NOTE    Ariel White  LOV:[id number] DOB: 23-Dec-1960 DOA: 01/14/2019 PCP: Carren Rang, PA-C    Brief Narrative:   66 WFbipolar disorder, depression/anxiety, tobacco abuse, alcohol abuse, GERD brought by EMS to emergency department due to concern of altered mental status.   Patient reports that she was trying to get out of the bed in the morning to go to bathroom,she fell and hit on head and passed out. - found herself in the vomitus. She had to drag herself to the kitchen as her phone was on the kitchen table,she called 911 and brought to the emergency department for further evaluation and management.   Complaining of headache, right shoulder and left leg pain. She has multiple bruises on her right eye, nose and both legs. She reports that she smokes 5 cigarettes/day-trying to quit and drinks alcohol only on the weekends (although reports drinks to help her sleep). Denies illicit drug use. She lives alone and independent of daily life activities does not use walker or cane for ambulation. She denies previous history of alcohol withdrawal.   In the ED, CK level: 1792. Ethanol: Undetectable, troponin x1-. Lactic acid: WNL. Imaging such as chest x-ray, pelvic x-ray, left foot, left tibia-fibula, right humerus, left femur, right shoulder, CT abdomen/pelvis, CT chest, CT maxillofacial, CT cervical spine and CT head all came back negative for acute findings.   Assessment & Plan:   Principal Problem:   Altered mental state Active Problems:   Depression, unspecified   Alcohol use disorder, mild, abuse   Tobacco abuse   Anxiety   Rhabdomyolysis   Macrocytosis   Status post fall   Syncope  Acute metabolic encephalopathy:  EToH undetectable, lactic acid normal, troponin negative, EKG normal.  Slightly elevated CK levels.  UA negative.  EEG normal.  TSH normal.  UDS positive for benzo and opiates likely from ER administration. Ammonia slightly elevated starting lactulose  twice daily 9/23-monitor Psychiatry has seen the patient, recommends Zyprexa 2.5 twice daily--continue Lamictal 50 bid EEG 9/21 cortical frontotemporal dysfunction no epileptiform issues ?drinks alcohol as per sister, keep on low dose benzo PRN.   Fall with multiple bruises: No skeletal injury on survey.  With deconditioning.  PT OT recommending skilled care placement with rolling walker wheels Wounds to be covered with saline dressings OT input and management appreicated--please revisit when able to help with further exercises  Rhabdomyolysis: CK level trending downwards.  D/c IVF  Alcohol abuse: Less likely alcohol withdrawal.  DC protocol for alcohol withdrawal on 9/23  Vitamin B12 deficiency: She has very low level of vitamin B12.  Started on replacement.    Hypokalemia Currently stable at this time-discontinue replacement with K. Dur 40 at this time and monitor  DVT prophylaxis: Lovenox Code Status: Full code Family Communication: called and updated sister re: POC 9/25 Disposition Plan: Needs skilled facility once can be arranged by CSW   Consultants:   Psychiatry  Procedures:   None  Antimicrobials:   None   Subjective:  No distress better mood tangential  Objective: Vitals:   01/19/19 2316 01/20/19 0352 01/20/19 0735 01/20/19 1115  BP: 140/62 (!) 113/47 118/79 113/65  Pulse: 95 79 94 (!) 109  Resp: (!) 8 18 16 16   Temp: 98.2 F (36.8 C) 97.9 F (36.6 C) 98.2 F (36.8 C) 97.9 F (36.6 C)  TempSrc: Oral Oral Oral Oral  SpO2: 99% 98% 99% 91%  Weight:      Height:  Intake/Output Summary (Last 24 hours) at 01/20/2019 1318 Last data filed at 01/20/2019 0438 Gross per 24 hour  Intake 600 ml  Output -  Net 600 ml   Filed Weights   01/15/19 0001  Weight: 53.1 kg    Examination:  EOMI NCAT-glasses No icterus no pallor mod dentition S1-S2 no murmur No changes to wounds Internal/external rotation at hip is fine   Data Reviewed: I have  personally reviewed following labs and imaging studies  CBC: Recent Labs  Lab 01/14/19 1648 01/14/19 1649 01/15/19 0509 01/16/19 0339 01/17/19 0844 01/20/19 0541  WBC 7.4  --  6.5 8.0 5.3 5.9  NEUTROABS  --   --   --  6.1 2.9 3.0  HGB 11.8*  --  11.4* 12.3 11.7* 12.0  HCT 35.4* 34.7 35.0* 37.1 35.5* 37.0  MCV 114.9*  --  115.9* 114.2* 114.1* 115.6*  PLT 379  --  349 400 398 462*   Basic Metabolic Panel: Recent Labs  Lab 01/14/19 1648 01/15/19 0509 01/16/19 0339 01/17/19 0844 01/18/19 0334 01/19/19 0402  NA  --  138 138 140 141 138  K  --  3.5 3.5 3.0* 4.4 4.5  CL  --  112* 107 107 107 102  CO2  --  17* 23 23 25 28   GLUCOSE  --  113* 131* 113* 110* 95  BUN  --  10 6 <5* 8 12  CREATININE 0.82 0.77 0.75 0.57 0.73 0.79  CALCIUM  --  7.7* 8.7* 8.9 9.3 9.4  MG 1.5*  --  2.3  --  1.9 2.0  PHOS 3.0  --   --   --   --   --    GFR: Estimated Creatinine Clearance: 64.2 mL/min (by C-G formula based on SCr of 0.79 mg/dL). Liver Function Tests: Recent Labs  Lab 01/14/19 1350 01/15/19 0509 01/17/19 0844 01/18/19 0334  AST 60* 45* 35 37  ALT 18 15 15 18   ALKPHOS 87 71 73 89  BILITOT 0.9 0.3 0.3 0.2*  PROT 7.0 5.5* 5.9* 6.9  ALBUMIN 4.0 3.0* 2.9* 3.5   No results for input(s): LIPASE, AMYLASE in the last 168 hours. Recent Labs  Lab 01/17/19 0850  AMMONIA 41*   Coagulation Profile: Recent Labs  Lab 01/15/19 0509  INR 1.0   Cardiac Enzymes: Recent Labs  Lab 01/14/19 1350 01/15/19 0509 01/16/19 0339 01/17/19 0545 01/18/19 0334  CKTOTAL 1,792* 1,021* 1,762* 620* 374*   BNP (last 3 results) No results for input(s): PROBNP in the last 8760 hours. HbA1C: No results for input(s): HGBA1C in the last 72 hours. CBG: No results for input(s): GLUCAP in the last 168 hours. Lipid Profile: No results for input(s): CHOL, HDL, LDLCALC, TRIG, CHOLHDL, LDLDIRECT in the last 72 hours. Thyroid Function Tests: No results for input(s): TSH, T4TOTAL, FREET4, T3FREE,  THYROIDAB in the last 72 hours. Anemia Panel: No results for input(s): VITAMINB12, FOLATE, FERRITIN, TIBC, IRON, RETICCTPCT in the last 72 hours. Sepsis Labs: Recent Labs  Lab 01/14/19 1350  LATICACIDVEN 1.5    Recent Results (from the past 240 hour(s))  SARS CORONAVIRUS 2 (TAT 6-24 HRS) Nasopharyngeal Nasopharyngeal Swab     Status: None   Collection Time: 01/14/19  4:48 PM   Specimen: Nasopharyngeal Swab  Result Value Ref Range Status   SARS Coronavirus 2 NEGATIVE NEGATIVE Final    Comment: (NOTE) SARS-CoV-2 target nucleic acids are NOT DETECTED. The SARS-CoV-2 RNA is generally detectable in upper and lower respiratory specimens during the acute phase  of infection. Negative results do not preclude SARS-CoV-2 infection, do not rule out co-infections with other pathogens, and should not be used as the sole basis for treatment or other patient management decisions. Negative results must be combined with clinical observations, patient history, and epidemiological information. The expected result is Negative. Fact Sheet for Patients: SugarRoll.be Fact Sheet for Healthcare Providers: https://www.woods-mathews.com/ This test is not yet approved or cleared by the [place] FDA and  has been authorized for detection and/or diagnosis of SARS-CoV-2 by FDA under an Emergency Use Authorization (EUA). This EUA will remain  in effect (meaning this test can be used) for the duration of the COVID-19 declaration under Section 56 4(b)(1) of the Act, 21 U.S.C. section 360bbb-3(b)(1), unless the authorization is terminated or revoked sooner. Performed at [facility], [place] [address], [place], [place] [address]     Radiology Studies: No results found.  Scheduled Meds: . enoxaparin (LOVENOX) injection  40 mg Subcutaneous Q24H  . fentaNYL (SUBLIMAZE) injection  50 mcg Intravenous Once  . folic acid  1 mg Oral Daily  . ibuprofen  400 mg Oral  TID  . lactulose  20 g Oral BID  . lamoTRIgine  50 mg Oral BID  . multivitamin with minerals  1 tablet Oral Daily  . nicotine  14 mg Transdermal Daily  . OLANZapine  2.5 mg Oral BID  . pantoprazole  40 mg Oral Daily  . vitamin B-12  1,000 mcg Oral Daily    LOS: 5 days  Time spent: 15 minutes  Verneita Griffes, MD [facility] 1:18 PM   If 7PM-7AM, please contact night-coverage [email or web address] Password [email or web address] 01/20/2019, 1:18 PM

## 2019-01-21 NOTE — Progress Notes (Signed)
CSW called and spoke with Bryson Ha with [facility]. The patient still has not received insurance authorization. Bryson Ha said that she will follow up with [employer] on Monday morning.   CSW will continue to follow.   Domenic Schwab, MSW, [place] Worker [employer]  [phone]

## 2019-01-21 NOTE — Progress Notes (Signed)
PROGRESS NOTE    Ariel White  ZOX:[id number] DOB: 1961-02-19 DOA: 01/14/2019 PCP: Carren Rang, PA-C   Brief Narrative:  Patient is a 58 year old female with history of bipolar disorder, anxiety/depression, tobacco use, alcohol abuse, GERD who was brought to the emergency department due to fall at home/altered mental status.  Patient lives by herself.  It was reported that she passed out at her home while she was going to the bathroom.  She complained of headache, right shoulder, left leg pain on presentation.  On presentation she was found to have elevated CK level.  No fracture or dislocation found on initial presentation.  Patient seen by physical therapy and recommended skilled nursing facility on discharge.  Currently her mental status is back to baseline, alert and oriented. She is hemodynamically stable for discharge to skilled nursing facility soon as the bed is available.   Assessment & Plan:   Principal Problem:   Altered mental state Active Problems:   Depression, unspecified   Alcohol use disorder, mild, abuse   Tobacco abuse   Anxiety   Rhabdomyolysis   Macrocytosis   Status post fall   Syncope   Acute metabolic encephalopathy: Work-up negative.  CT head did not show any acute intracranial abnormalities.  UA, EEG, TSH normal.  Found to have slightly elevated ammonia level for which lactulose was started.  Psychiatry seen the patient for suspicion of hospital-acquired delirium and on Zyprexa 2.5 mg twice a day. EEG on 9/21 showed cortical frontotemporal dysfunction, no epileptiform discharge. Currently she is completely alert and oriented and back to her baseline mental status.  FallLarey Seat at home.  Deconditioning/debilitated on presentation.  PT/OT recommended skilled nursing facility on discharge.  She had multiple bruises/ulcers on presentation.    History of anxiety/depression: On Lamictal at home.  Rhabdomyolysis: Due to fall.  CK was elevated on presentation.   Trended down with IV fluids.  Chronic alcohol abuse: Counseled cessation.  Continue thiamine folic acid  B12 deficiency:Started on supplementation  Hypokalemia: Supplemented and corrected         DVT prophylaxis: Lovenox Code Status: Full Family Communication: None present at the bedside Disposition Plan: Skilled nursing facility as soon as the bed is available   Consultants: None  Procedures: None  Antimicrobials:  Anti-infectives (From admission, onward)   None      Subjective:  Patient seen and examined at bedside this morning.  Currently hemodynamically stable.  Comfortable.  Alert and oriented.  Denies any complaints.  Objective: Vitals:   01/20/19 1932 01/21/19 0030 01/21/19 0406 01/21/19 0819  BP: 134/69 113/63 (!) 116/46 118/69  Pulse: 98 88 77 86  Resp: 18 18 18 16   Temp: 97.8 F (36.6 C) 98 F (36.7 C) 97.6 F (36.4 C) 98 F (36.7 C)  TempSrc: Oral Oral Oral Oral  SpO2: 95% 96% 98% 96%  Weight:      Height:       No intake or output data in the 24 hours ending 01/21/19 1111 Filed Weights   01/15/19 0001  Weight: 53.1 kg    Examination:  General exam: Appears calm and comfortable ,Not in distress,appears older than age,deconitioned HEENT:PERRL,Oral mucosa moist, Ear/Nose normal on gross exam Respiratory system: Bilateral equal air entry, normal vesicular breath sounds, no wheezes or crackles  Cardiovascular system: S1 & S2 heard, RRR. No JVD, murmurs, rubs, gallops or clicks. No pedal edema. Gastrointestinal system: Abdomen is nondistended, soft and nontender. No organomegaly or masses felt. Normal bowel sounds heard. Central nervous  system: Alert and oriented. No focal neurological deficits. Extremities: No edema, no clubbing ,no cyanosis, distal peripheral pulses palpable. Skin: Ulceration on the left knee and shin    Data Reviewed: I have personally reviewed following labs and imaging studies  CBC: Recent Labs  Lab 01/14/19 1648  01/14/19 1649 01/15/19 0509 01/16/19 0339 01/17/19 0844 01/20/19 0541  WBC 7.4  --  6.5 8.0 5.3 5.9  NEUTROABS  --   --   --  6.1 2.9 3.0  HGB 11.8*  --  11.4* 12.3 11.7* 12.0  HCT 35.4* 34.7 35.0* 37.1 35.5* 37.0  MCV 114.9*  --  115.9* 114.2* 114.1* 115.6*  PLT 379  --  349 400 398 462*   Basic Metabolic Panel: Recent Labs  Lab 01/14/19 1648 01/15/19 0509 01/16/19 0339 01/17/19 0844 01/18/19 0334 01/19/19 0402  NA  --  138 138 140 141 138  K  --  3.5 3.5 3.0* 4.4 4.5  CL  --  112* 107 107 107 102  CO2  --  17* 23 23 25 28   GLUCOSE  --  113* 131* 113* 110* 95  BUN  --  10 6 <5* 8 12  CREATININE 0.82 0.77 0.75 0.57 0.73 0.79  CALCIUM  --  7.7* 8.7* 8.9 9.3 9.4  MG 1.5*  --  2.3  --  1.9 2.0  PHOS 3.0  --   --   --   --   --    GFR: Estimated Creatinine Clearance: 64.2 mL/min (by C-G formula based on SCr of 0.79 mg/dL). Liver Function Tests: Recent Labs  Lab 01/14/19 1350 01/15/19 0509 01/17/19 0844 01/18/19 0334  AST 60* 45* 35 37  ALT 18 15 15 18   ALKPHOS 87 71 73 89  BILITOT 0.9 0.3 0.3 0.2*  PROT 7.0 5.5* 5.9* 6.9  ALBUMIN 4.0 3.0* 2.9* 3.5   No results for input(s): LIPASE, AMYLASE in the last 168 hours. Recent Labs  Lab 01/17/19 0850  AMMONIA 41*   Coagulation Profile: Recent Labs  Lab 01/15/19 0509  INR 1.0   Cardiac Enzymes: Recent Labs  Lab 01/14/19 1350 01/15/19 0509 01/16/19 0339 01/17/19 0545 01/18/19 0334  CKTOTAL 1,792* 1,021* 1,762* 620* 374*   BNP (last 3 results) No results for input(s): PROBNP in the last 8760 hours. HbA1C: No results for input(s): HGBA1C in the last 72 hours. CBG: Recent Labs  Lab 01/20/19 2157  GLUCAP 128*   Lipid Profile: No results for input(s): CHOL, HDL, LDLCALC, TRIG, CHOLHDL, LDLDIRECT in the last 72 hours. Thyroid Function Tests: No results for input(s): TSH, T4TOTAL, FREET4, T3FREE, THYROIDAB in the last 72 hours. Anemia Panel: No results for input(s): VITAMINB12, FOLATE, FERRITIN, TIBC,  IRON, RETICCTPCT in the last 72 hours. Sepsis Labs: Recent Labs  Lab 01/14/19 1350  LATICACIDVEN 1.5    Recent Results (from the past 240 hour(s))  SARS CORONAVIRUS 2 (TAT 6-24 HRS) Nasopharyngeal Nasopharyngeal Swab     Status: None   Collection Time: 01/14/19  4:48 PM   Specimen: Nasopharyngeal Swab  Result Value Ref Range Status   SARS Coronavirus 2 NEGATIVE NEGATIVE Final    Comment: (NOTE) SARS-CoV-2 target nucleic acids are NOT DETECTED. The SARS-CoV-2 RNA is generally detectable in upper and lower respiratory specimens during the acute phase of infection. Negative results do not preclude SARS-CoV-2 infection, do not rule out co-infections with other pathogens, and should not be used as the sole basis for treatment or other patient management decisions. Negative results must be combined  with clinical observations, patient history, and epidemiological information. The expected result is Negative. Fact Sheet for Patients: SugarRoll.be Fact Sheet for Healthcare Providers: https://www.woods-mathews.com/ This test is not yet approved or cleared by the [place] FDA and  has been authorized for detection and/or diagnosis of SARS-CoV-2 by FDA under an Emergency Use Authorization (EUA). This EUA will remain  in effect (meaning this test can be used) for the duration of the COVID-19 declaration under Section 56 4(b)(1) of the Act, 21 U.S.C. section 360bbb-3(b)(1), unless the authorization is terminated or revoked sooner. Performed at [facility], [place] [address], [place], [place] [address]          Radiology Studies: No results found.      Scheduled Meds: . enoxaparin (LOVENOX) injection  40 mg Subcutaneous Q24H  . fentaNYL (SUBLIMAZE) injection  50 mcg Intravenous Once  . folic acid  1 mg Oral Daily  . ibuprofen  400 mg Oral TID  . lactulose  20 g Oral BID  . lamoTRIgine  50 mg Oral BID  . multivitamin with  minerals  1 tablet Oral Daily  . nicotine  14 mg Transdermal Daily  . OLANZapine  2.5 mg Oral BID  . pantoprazole  40 mg Oral Daily  . vitamin B-12  1,000 mcg Oral Daily   Continuous Infusions:   LOS: 6 days    Time spent: 35 mins.More than 50% of that time was spent in counseling and/or coordination of care.      Shelly Coss, MD [facility] Pager [phone]  If 7PM-7AM, please contact night-coverage [email or web address] Password [email or web address] 01/21/2019, 11:11 AM

## 2019-01-21 NOTE — Progress Notes (Signed)
Occupational Therapy Treatment Patient Details Name: Ariel White MRN: [id number] DOB: [birth year] Today's Date: 01/21/2019    History of present illness 58 y.o. female presenting with AMS as well as fall in bathroom and hit her head. CT of head showing no acute abnormalities. PMH including bipolar disorder, depression/anxiety, tobacco abuse, alcohol abuse, R shoulder hemiarthroplasty, and GERD.   OT comments  Pt progressing well and continues to require assist with AAROM of RUE to 90* today. Pt's ability to perform RUE elbow through digit exercises, WFLs. Pt went on a rant that she will only go to a SNF in [place] or else she is going home with [facility]. OT explained importance of requiring 24/7 supervision for safety as pt's pain in still attempting to be controlled. Pt performing ADL at sink with supervisionA to minguardA. Pt attempting use of RUE for combing hair with poor accuracy due to pain. Pt continues to progress with mobility, no AD used today and no LOB episodes occurred.  OT continuesto recommend SNF. OT following acutely.    Follow Up Recommendations  SNF;Supervision/Assistance - 24 hour(If pt refuses SNF, HHOT required.)    Equipment Recommendations  Other (comment)(to be determined)    Recommendations for Other Services      Precautions / Restrictions Precautions Precautions: Fall Restrictions Weight Bearing Restrictions: No LLE Weight Bearing: Weight bearing as tolerated       Mobility Bed Mobility Overal bed mobility: Needs Assistance Bed Mobility: Supine to Sit     Supine to sit: Supervision     General bed mobility comments: supervision for safety; increased time and effort  Transfers Overall transfer level: Needs assistance Equipment used: Rolling walker (2 wheeled) Transfers: Sit to/from Stand Sit to Stand: Supervision         General transfer comment: assist to steady    Balance Overall balance assessment: Needs assistance Sitting-balance  support: Feet supported Sitting balance-Leahy Scale: Good     Standing balance support: During functional activity;Single extremity supported Standing balance-Leahy Scale: Fair Standing balance comment: no AD today                           ADL either performed or assessed with clinical judgement   ADL Overall ADL's : Needs assistance/impaired     Grooming: Min guard;Standing Grooming Details (indicate cue type and reason): standing at sink for ADL tasks.                             Functional mobility during ADLs: Min guard;Cueing for safety;Rolling walker;Cueing for sequencing General ADL Comments: Pt presenting with decreased strength and increased pain. Pt has improved her activity tolerance for mobility in room and standing at sink for ADL to 10 mins.      Vision       Perception     Praxis      Cognition Arousal/Alertness: Awake/alert Behavior During Therapy: WFL for tasks assessed/performed Overall Cognitive Status: Within Functional Limits for tasks assessed                                          Exercises Exercises: Other exercises Other Exercises Other Exercises: Shoulder flex, ext, abduction, elbow through hands, x5 reps,1 set   Shoulder Instructions       General Comments Pt reports that she will only  go to a SNF in [place] or else she is going home with HH. OT explained importance of requiring 24/7 supervision for safety fora  few weeks as pt's pain in still attempting to be controlled.    Pertinent Vitals/ Pain       Pain Assessment: Faces Faces Pain Scale: Hurts little more Pain Location: R shoulder Pain Descriptors / Indicators: Sore Pain Intervention(s): Monitored during session;Premedicated before session  Home Living                                          Prior Functioning/Environment              Frequency  Min 2X/week        Progress Toward Goals  OT  Goals(current goals can now be found in the care plan section)  Progress towards OT goals: Progressing toward goals  Acute Rehab OT Goals Patient Stated Goal: Move by R arm so it does not get frozen OT Goal Formulation: With patient Time For Goal Achievement: 01/29/19 Potential to Achieve Goals: Good ADL Goals Pt Will Perform Grooming: standing;with set-up;with supervision Pt Will Perform Lower Body Dressing: with set-up;with supervision;sit to/from stand Pt Will Transfer to Toilet: with set-up;with supervision;ambulating;bedside commode Pt Will Perform Toileting - Clothing Manipulation and hygiene: sit to/from stand;sitting/lateral leans;with modified independence  Plan Discharge plan remains appropriate    Co-evaluation                 AM-PAC OT "6 Clicks" Daily Activity     Outcome Measure   Help from another person eating meals?: None Help from another person taking care of personal grooming?: A Little Help from another person toileting, which includes using toliet, bedpan, or urinal?: A Little Help from another person bathing (including washing, rinsing, drying)?: A Little Help from another person to put on and taking off regular upper body clothing?: A Little Help from another person to put on and taking off regular lower body clothing?: A Lot 6 Click Score: 18    End of Session Equipment Utilized During Treatment: Rolling walker;Gait belt  OT Visit Diagnosis: Unsteadiness on feet (R26.81);Other abnormalities of gait and mobility (R26.89);Muscle weakness (generalized) (M62.81);Pain Pain - Right/Left: Right Pain - part of body: Shoulder   Activity Tolerance Patient limited by pain   Patient Left in chair;with call bell/phone within reach;with chair alarm set   Nurse Communication Mobility status        Time: 3151-7616 OT Time Calculation (min): 26 min  Charges: OT General Charges $OT Visit: 1 Visit OT Treatments $Self Care/Home Management : 23-37  mins  Revonda Standard Cecil Cranker) Glendell Docker OTR/L Acute Rehabilitation Services Pager: [phone] Office: [phone]    Lonzo Cloud 01/21/2019, 2:59 PM

## 2019-01-22 MED ORDER — VITAMIN B-1 100 MG PO TABS: 100.0000 mg | ORAL_TABLET | Freq: Every day | ORAL | 0 refills | Status: DC

## 2019-01-22 MED ORDER — FOLIC ACID 1 MG PO TABS: 1.0000 mg | ORAL_TABLET | Freq: Every day | ORAL | 0 refills | Status: DC

## 2019-01-22 MED ORDER — PANTOPRAZOLE SODIUM 40 MG PO TBEC: 40.0000 mg | DELAYED_RELEASE_TABLET | Freq: Every day | ORAL | 0 refills | Status: DC

## 2019-01-22 MED ORDER — LACTULOSE 10 GM/15ML PO SOLN: 10.0000 g | Freq: Two times a day (BID) | ORAL | 0 refills | Status: AC

## 2019-01-22 MED ORDER — OLANZAPINE 2.5 MG PO TABS: 2.5000 mg | ORAL_TABLET | Freq: Two times a day (BID) | ORAL | 0 refills | Status: AC

## 2019-01-22 MED ORDER — CYANOCOBALAMIN 1000 MCG PO TABS: 1000.0000 ug | ORAL_TABLET | Freq: Every day | ORAL | 0 refills | Status: DC

## 2019-01-22 NOTE — Discharge Summary (Signed)
Physician Discharge Summary  Ariel White ZOX:[id number] DOB: February 14, 1961 DOA: 01/14/2019  PCP: Carren Rang, PA-C  Admit date: 01/14/2019 Discharge date: 01/22/2019  Admitted From: Home Disposition:  Home  Discharge Condition:Stable CODE STATUS:FULL Diet recommendation:  Regular / Dysphagia   Brief/Interim Summary: Patient is a 58 year old female with history of bipolar disorder, anxiety/depression, tobacco use, alcohol abuse, GERD who was brought to the emergency department due to fall at home/altered mental status.  Patient lives by herself.  It was reported that she passed out at her home while she was going to the bathroom.  She complained of headache, right shoulder, left leg pain on presentation.  On presentation she was found to have elevated CK level.  No fracture or dislocation found on initial presentation.  Patient seen by physical therapy and recommended HHPT on discharge.  Currently her mental status is back to baseline, alert and oriented. She is hemodynamically stable for discharge to home today.  Following problems were addressed during her hospitalization:  Acute metabolic encephalopathy: Work-up negative.  CT head did not show any acute intracranial abnormalities.  UA, EEG, TSH normal.  Found to have slightly elevated ammonia level for which lactulose was started.  Psychiatry seen the patient for suspicion of hospital-acquired delirium and she has been started  on Zyprexa 2.5 mg twice a day. EEG on 9/21 showed cortical frontotemporal dysfunction, no epileptiform discharge. Currently she is completely alert and oriented and back to her baseline mental status.  FallLarey Seat at home.  Deconditioning/debilitated on presentation.  PT/OT recommended HHPT on discharge.  She had multiple bruises/ulcers on presentation.    History of anxiety/depression: On Lamictal at home.  Rhabdomyolysis: Due to fall.  CK was elevated on presentation.  Trended down with IV fluids.  Chronic  alcohol abuse: Counseled cessation.  Continue multivitamin , folic acid  B12 deficiency:Started on supplementation  Hypokalemia: Supplemented and corrected   Discharge Diagnoses:  Principal Problem:   Altered mental state Active Problems:   Depression, unspecified   Alcohol use disorder, mild, abuse   Tobacco abuse   Anxiety   Rhabdomyolysis   Macrocytosis   Status post fall   Syncope    Discharge Instructions  Discharge Instructions    Diet - low sodium heart healthy   Complete by: As directed    Discharge instructions   Complete by: As directed    1)Please follow up with your PCP in a week. 2)Take prescribed medications as instructed.   Increase activity slowly   Complete by: As directed      Allergies as of 01/22/2019      Reactions   Compazine [prochlorperazine Edisylate] Other (See Comments)   euphoria   Ivp Dye [iodinated Diagnostic Agents] Anaphylaxis   "stopped her heart"   Baclofen Other (See Comments)   dizziness   Imitrex [sumatriptan] Swelling   "throat swelling"   Voltaren [diclofenac] Other (See Comments)   Cannot hold onto objects (drops things)      Medication List    STOP taking these medications   oxyCODONE 5 MG immediate release tablet Commonly known as: Oxy IR/ROXICODONE     TAKE these medications   ARTHRITIS STRENGTH BC POWDER PO Take 1 packet by mouth 2 (two) times daily as needed (pain).   aspirin EC 325 MG tablet Take 1 tablet (325 mg total) by mouth daily.   cyanocobalamin 1000 MCG tablet Take 1 tablet (1,000 mcg total) by mouth daily. Start taking on: January 23, 2019   diphenhydramine-acetaminophen 25-500 MG Tabs  tablet Commonly known as: TYLENOL PM Take 2 tablets by mouth at bedtime as needed (sleep).   folic acid 1 MG tablet Commonly known as: FOLVITE Take 1 tablet (1 mg total) by mouth daily. Start taking on: January 23, 2019   Gaviscon Extra Strength 160-105 MG Chew Generic drug: Alum Hydroxide-Mag  Carbonate Chew 4 tablets by mouth 2 (two) times daily as needed (bloating).   lactulose 10 GM/15ML solution Commonly known as: CHRONULAC Take 15 mLs (10 g total) by mouth 2 (two) times daily.   lamoTRIgine 25 MG tablet Commonly known as: LAMICTAL Take 50 mg by mouth 2 (two) times daily.   neomycin-bacitracin-polymyxin Oint Commonly known as: NEOSPORIN Apply 1 application topically 2 (two) times daily as needed for wound care.   OLANZapine 2.5 MG tablet Commonly known as: ZYPREXA Take 1 tablet (2.5 mg total) by mouth 2 (two) times daily.   ondansetron 4 MG tablet Commonly known as: ZOFRAN Take 1 tablet (4 mg total) by mouth every 6 (six) hours as needed for nausea.   pantoprazole 40 MG tablet Commonly known as: PROTONIX Take 1 tablet (40 mg total) by mouth daily. Start taking on: January 23, 2019 What changed:   medication strength  how much to take  when to take this   traMADol 50 MG tablet Commonly known as: ULTRAM Take 1 tablet (50 mg total) by mouth every 6 (six) hours as needed for moderate pain.   Tylenol Arthritis Pain 650 MG CR tablet Generic drug: acetaminophen Take 1,300 mg by mouth 2 (two) times daily.      Follow-up Information    Wayland Denis, PA-C. Schedule an appointment as soon as possible for a visit in 1 week(s).   Specialty: Physician Assistant Contact information: [place] [address] [phone]          Allergies  Allergen Reactions  . Compazine [Prochlorperazine Edisylate] Other (See Comments)    euphoria  . Ivp Dye [Iodinated Diagnostic Agents] Anaphylaxis    "stopped her heart"  . Baclofen Other (See Comments)    dizziness  . Imitrex [Sumatriptan] Swelling    "throat swelling"  . Voltaren [Diclofenac] Other (See Comments)    Cannot hold onto objects (drops things)    Consultations:  Pschiatry   Procedures/Studies: Ct Abdomen Pelvis Wo Contrast  Result Date:  01/14/2019 CLINICAL DATA:  Fall EXAM: CT CHEST, ABDOMEN AND PELVIS WITHOUT CONTRAST TECHNIQUE: Multidetector CT imaging of the chest, abdomen and pelvis was performed following the standard protocol without IV contrast. COMPARISON:  Partial comparison to right shoulder radiographs dated 11/14/2018. FINDINGS: CT CHEST FINDINGS Cardiovascular: No evidence of traumatic aortic injury. Ectasia of the ascending thoracic aorta, measuring 3.5 cm. The heart is normal in size.  No pericardial effusion. Mediastinum/Nodes: No evidence of hematoma along the anterior mediastinum. No suspicious mediastinal lymphadenopathy. Visualized thyroid is unremarkable. Lungs/Pleura: Biapical pleural-parenchymal scarring. No suspicious pulmonary nodules. No focal consolidation. No pleural effusion or pneumothorax. Musculoskeletal: Thoracic spine is within normal limits. Right shoulder arthroplasty, without evidence of complication. Cortical buckling along the lateral right clavicle (series 3/image 5). However, when correlating with prior studies, this favors a chronic deformity. Old right posterior 10th and 11th rib fracture deformities. No acute fracture is seen. Specifically, the clavicles, sternum, scapulae, and bilateral ribs are intact. CT ABDOMEN PELVIS FINDINGS Motion degraded images Hepatobiliary: Unenhanced liver is unremarkable. No. Fat fluid/hemorrhage. Gallbladder is unremarkable. No intrahepatic or extrahepatic ductal dilatation. Pancreas: Within normal limits. Spleen: Within normal limits.  No perisplenic fluid/hemorrhage. Adrenals/Urinary Tract: Adrenal glands are within normal limits. Punctate interpolar left renal calculus (series 3/image 70). Additional 5 mm nonobstructing right upper pole renal calculus (series 3/image 35). No hydronephrosis. Bladder is within normal limits. Stomach/Bowel: Stomach is within normal limits. No evidence of bowel obstruction. Prior appendectomy. Vascular/Lymphatic: No evidence of abdominal  aortic aneurysm. No suspicious abdominopelvic lymphadenopathy. Reproductive: Uterus is unremarkable. Left ovary is within normal limits.  No right adnexal mass. Other: No abdominopelvic ascites. No hemoperitoneum or free air. Musculoskeletal: Mild degenerative changes at L2-3. No fracture is seen. IMPRESSION: Suspected chronic deformity involving the lateral right clavicle. Correlate for point tenderness to exclude acute on chronic injury. Otherwise, no evidence of acute traumatic injury to the chest, abdomen, or pelvis. Old right posterior 10th and 11th rib fracture deformities. Electronically Signed   By: Charline Bills M.D.   On: 01/14/2019 13:36   Dg Chest 1 View  Result Date: 01/14/2019 CLINICAL DATA:  Fall, pain, bruising EXAM: CHEST  1 VIEW COMPARISON:  01/01/2014 FINDINGS: Lungs are clear.  No pleural effusion or pneumothorax. The heart is normal in size. Right shoulder arthroplasty, incompletely visualized. Right lateral clavicle deformity noted on recent CT is excluded from this image, but a similar deformity was present in 2015. Multiple right rib deformities, including the right anterior 7th and 8th ribs, which were not evident on recent CT due to motion degradation. Right lateral 10th rib deformity is favored to be chronic on CT. IMPRESSION: Right anterior 7th and 8th rib deformities were not evident on recent CT due to motion degradation. Correlate for point tenderness. Right lateral 10th rib deformity is favored to be chronic when correlating with recent CT. Right lateral clavicle deformity noted on recent CT is excluded from the current study, but a similar deformity was present on prior 2015 chest radiograph. Electronically Signed   By: Charline Bills M.D.   On: 01/14/2019 14:07   Dg Pelvis 1-2 Views  Result Date: 01/14/2019 CLINICAL DATA:  Fall, pain, bruising EXAM: PELVIS - 1-2 VIEW COMPARISON:  None. FINDINGS: No fracture or dislocation is seen. Bowel hip joint spaces are preserved.  Visualized bony pelvis appears intact. IMPRESSION: Negative. Electronically Signed   By: Charline Bills M.D.   On: 01/14/2019 13:56   Dg Shoulder Right  Result Date: 01/14/2019 CLINICAL DATA:  Fall, pain EXAM: RIGHT SHOULDER - 2+ VIEW; RIGHT HUMERUS - 2+ VIEW COMPARISON:  11/26/2013 FINDINGS: Status post right shoulder humeral arthroplasty. No fracture or dislocation of the right shoulder. The acromioclavicular joint is intact. The partially imaged right chest is unremarkable. No fracture or dislocation of the distal right humerus. Soft tissues are normal. IMPRESSION: 1. Status post right shoulder humeral arthroplasty. No fracture or dislocation of the right shoulder. 2.  No fracture or dislocation of the distal right humerus. Electronically Signed   By: Lauralyn Primes M.D.   On: 01/14/2019 13:59   Dg Tibia/fibula Left  Result Date: 01/14/2019 CLINICAL DATA:  Fall, hip pain EXAM: LEFT TIBIA AND FIBULA - 2 VIEW COMPARISON:  None. FINDINGS: No fracture or dislocation is seen. The joint spaces are preserved. Visualized soft tissues are within normal limits. IMPRESSION: Negative. Electronically Signed   By: Charline Bills M.D.   On: 01/14/2019 13:55   Ct Head Wo Contrast  Result Date: 01/14/2019 CLINICAL DATA:  Fall, AMS EXAM: CT HEAD WITHOUT CONTRAST CT MAXILLOFACIAL WITHOUT CONTRAST CT CERVICAL SPINE WITHOUT CONTRAST TECHNIQUE: Multidetector CT imaging of the head, cervical spine, and maxillofacial structures were  performed using the standard protocol without intravenous contrast. Multiplanar CT image reconstructions of the cervical spine and maxillofacial structures were also generated. COMPARISON:  08/07/2015, 07/11/2009 FINDINGS: CT HEAD FINDINGS Brain: No evidence of acute infarction, hemorrhage, hydrocephalus, extra-axial collection or mass lesion/mass effect. There are post resection findings of the cerebellum with overlying occipital craniotomy (series 3, image 12, series 6, image 31). Vascular:  No hyperdense vessel or unexpected calcification. CT FACIAL BONES FINDINGS Skull: Redemonstrated postoperative findings of occipital craniotomy. Negative for fracture or focal lesion. Facial bones: No displaced fractures or dislocations. Sinuses/Orbits: No acute finding. 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 Although not completely imaged on prior examinations this is grossly unchanged in comparison to partial imaging on remote prior head CT dated 07/11/2009. Other: None. CT CERVICAL SPINE FINDINGS Alignment: Degenerative straightening and multilevel degenerative anterolisthesis. Skull base and vertebrae: No acute fracture. No primary bone lesion or focal pathologic process. Soft tissues and spinal canal: No prevertebral fluid or swelling. No visible canal hematoma. Disc levels:  Severe multilevel disc degenerative and osteophytosis. Upper chest: Negative. Other: None. IMPRESSION: 1.  No acute intracranial pathology. 2. Redemonstrated postoperative findings of cerebellar resection with overlying occipital craniotomy. 3.  No displaced fracture or dislocation of the facial bones. 4. 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 Although not completely imaged on prior examinations this is grossly unchanged in comparison to partial imaging on remote prior head CT dated 07/11/2009. 5. No fracture or static subluxation of the cervical spine. Severe multilevel disc degenerative disease. Electronically Signed   By: Lauralyn Primes M.D.   On: 01/14/2019 13:32   Ct Chest Wo Contrast  Result Date: 01/14/2019 CLINICAL DATA:  Fall EXAM: CT CHEST, ABDOMEN AND PELVIS WITHOUT CONTRAST TECHNIQUE: Multidetector CT imaging of the chest, abdomen and pelvis was performed following the standard protocol without IV contrast. COMPARISON:  Partial comparison to  right shoulder radiographs dated 11/14/2018. FINDINGS: CT CHEST FINDINGS Cardiovascular: No evidence of traumatic aortic injury. Ectasia of the ascending thoracic aorta, measuring 3.5 cm. The heart is normal in size.  No pericardial effusion. Mediastinum/Nodes: No evidence of hematoma along the anterior mediastinum. No suspicious mediastinal lymphadenopathy. Visualized thyroid is unremarkable. Lungs/Pleura: Biapical pleural-parenchymal scarring. No suspicious pulmonary nodules. No focal consolidation. No pleural effusion or pneumothorax. Musculoskeletal: Thoracic spine is within normal limits. Right shoulder arthroplasty, without evidence of complication. Cortical buckling along the lateral right clavicle (series 3/image 5). However, when correlating with prior studies, this favors a chronic deformity. Old right posterior 10th and 11th rib fracture deformities. No acute fracture is seen. Specifically, the clavicles, sternum, scapulae, and bilateral ribs are intact. CT ABDOMEN PELVIS FINDINGS Motion degraded images Hepatobiliary: Unenhanced liver is unremarkable. No. Fat fluid/hemorrhage. Gallbladder is unremarkable. No intrahepatic or extrahepatic ductal dilatation. Pancreas: Within normal limits. Spleen: Within normal limits.  No perisplenic fluid/hemorrhage. Adrenals/Urinary Tract: Adrenal glands are within normal limits. Punctate interpolar left renal calculus (series 3/image 70). Additional 5 mm nonobstructing right upper pole renal calculus (series 3/image 35). No hydronephrosis. Bladder is within normal limits. Stomach/Bowel: Stomach is within normal limits. No evidence of bowel obstruction. Prior appendectomy. Vascular/Lymphatic: No evidence of abdominal aortic aneurysm. No suspicious abdominopelvic lymphadenopathy. Reproductive: Uterus is unremarkable. Left ovary is within normal limits.  No right adnexal mass. Other: No abdominopelvic ascites. No hemoperitoneum or  free air. Musculoskeletal: Mild  degenerative changes at L2-3. No fracture is seen. IMPRESSION: Suspected chronic deformity involving the lateral right clavicle. Correlate for point tenderness to exclude acute on chronic injury. Otherwise, no evidence of acute traumatic injury to the chest, abdomen, or pelvis. Old right posterior 10th and 11th rib fracture deformities. Electronically Signed   By: Charline BillsSriyesh  Krishnan M.D.   On: 01/14/2019 13:36   Ct Cervical Spine Wo Contrast  Result Date: 01/14/2019 CLINICAL DATA:  Fall, AMS EXAM: CT HEAD WITHOUT CONTRAST CT MAXILLOFACIAL WITHOUT CONTRAST CT CERVICAL SPINE WITHOUT CONTRAST TECHNIQUE: Multidetector CT imaging of the head, cervical spine, and maxillofacial structures were performed using the standard protocol without intravenous contrast. Multiplanar CT image reconstructions of the cervical spine and maxillofacial structures were also generated. COMPARISON:  08/07/2015, 07/11/2009 FINDINGS: CT HEAD FINDINGS Brain: No evidence of acute infarction, hemorrhage, hydrocephalus, extra-axial collection or mass lesion/mass effect. There are post resection findings of the cerebellum with overlying occipital craniotomy (series 3, image 12, series 6, image 31). Vascular: No hyperdense vessel or unexpected calcification. CT FACIAL BONES FINDINGS Skull: Redemonstrated postoperative findings of occipital craniotomy. Negative for fracture or focal lesion. Facial bones: No displaced fractures or dislocations. Sinuses/Orbits: No acute finding. 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 Although not completely imaged on prior examinations this is grossly unchanged in comparison to partial imaging on remote prior head CT dated 07/11/2009. Other: None. CT CERVICAL SPINE FINDINGS Alignment: Degenerative straightening and multilevel degenerative anterolisthesis. Skull base and vertebrae: No acute fracture. No primary bone lesion or  focal pathologic process. Soft tissues and spinal canal: No prevertebral fluid or swelling. No visible canal hematoma. Disc levels:  Severe multilevel disc degenerative and osteophytosis. Upper chest: Negative. Other: None. IMPRESSION: 1.  No acute intracranial pathology. 2. Redemonstrated postoperative findings of cerebellar resection with overlying occipital craniotomy. 3.  No displaced fracture or dislocation of the facial bones. 4. 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 Although not completely imaged on prior examinations this is grossly unchanged in comparison to partial imaging on remote prior head CT dated 07/11/2009. 5. No fracture or static subluxation of the cervical spine. Severe multilevel disc degenerative disease. Electronically Signed   By: Lauralyn PrimesAlex  Bibbey M.D.   On: 01/14/2019 13:32   Dg Humerus Right  Result Date: 01/14/2019 CLINICAL DATA:  Fall, pain EXAM: RIGHT SHOULDER - 2+ VIEW; RIGHT HUMERUS - 2+ VIEW COMPARISON:  11/26/2013 FINDINGS: Status post right shoulder humeral arthroplasty. No fracture or dislocation of the right shoulder. The acromioclavicular joint is intact. The partially imaged right chest is unremarkable. No fracture or dislocation of the distal right humerus. Soft tissues are normal. IMPRESSION: 1. Status post right shoulder humeral arthroplasty. No fracture or dislocation of the right shoulder. 2.  No fracture or dislocation of the distal right humerus. Electronically Signed   By: Lauralyn PrimesAlex  Bibbey M.D.   On: 01/14/2019 13:59   Dg Foot Complete Left  Result Date: 01/14/2019 CLINICAL DATA:  Fall, pain EXAM: LEFT FOOT - COMPLETE 3+ VIEW COMPARISON:  None. FINDINGS: No fracture or dislocation is seen. The joint spaces are preserved. Visualized soft tissues are within normal limits. IMPRESSION: Negative. Electronically Signed   By: Charline BillsSriyesh  Krishnan M.D.   On: 01/14/2019 13:55   Dg Femur  Min 2 Views Left  Result  Date: 01/14/2019 CLINICAL DATA:  Fall, hip pain EXAM: LEFT FEMUR 2 VIEWS COMPARISON:  None. FINDINGS: No fracture or dislocation is seen. The joint spaces are preserved. Visualized soft tissues are within normal limits. Visualized bony pelvis appears intact. IMPRESSION: Negative. Electronically Signed   By: Charline Bills M.D.   On: 01/14/2019 13:55   Ct Maxillofacial Wo Contrast  Result Date: 01/14/2019 CLINICAL DATA:  Fall, AMS EXAM: CT HEAD WITHOUT CONTRAST CT MAXILLOFACIAL WITHOUT CONTRAST CT CERVICAL SPINE WITHOUT CONTRAST TECHNIQUE: Multidetector CT imaging of the head, cervical spine, and maxillofacial structures were performed using the standard protocol without intravenous contrast. Multiplanar CT image reconstructions of the cervical spine and maxillofacial structures were also generated. COMPARISON:  08/07/2015, 07/11/2009 FINDINGS: CT HEAD FINDINGS Brain: No evidence of acute infarction, hemorrhage, hydrocephalus, extra-axial collection or mass lesion/mass effect. There are post resection findings of the cerebellum with overlying occipital craniotomy (series 3, image 12, series 6, image 31). Vascular: No hyperdense vessel or unexpected calcification. CT FACIAL BONES FINDINGS Skull: Redemonstrated postoperative findings of occipital craniotomy. Negative for fracture or focal lesion. Facial bones: No displaced fractures or dislocations. Sinuses/Orbits: No acute finding. 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 Although not completely imaged on prior examinations this is grossly unchanged in comparison to partial imaging on remote prior head CT dated 07/11/2009. Other: None. CT CERVICAL SPINE FINDINGS Alignment: Degenerative straightening and multilevel degenerative anterolisthesis. Skull base and vertebrae: No acute fracture. No primary bone lesion or focal pathologic process. Soft  tissues and spinal canal: No prevertebral fluid or swelling. No visible canal hematoma. Disc levels:  Severe multilevel disc degenerative and osteophytosis. Upper chest: Negative. Other: None. IMPRESSION: 1.  No acute intracranial pathology. 2. Redemonstrated postoperative findings of cerebellar resection with overlying occipital craniotomy. 3.  No displaced fracture or dislocation of the facial bones. 4. 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 Although not completely imaged on prior examinations this is grossly unchanged in comparison to partial imaging on remote prior head CT dated 07/11/2009. 5. No fracture or static subluxation of the cervical spine. Severe multilevel disc degenerative disease. Electronically Signed   By: Lauralyn Primes M.D.   On: 01/14/2019 13:32       Subjective:  Patient seen and examined at bedside this morning.  Hemodynamically stable for discharge.  Discharge Exam: Vitals:   01/22/19 0851 01/22/19 1117  BP: (!) 141/63 130/66  Pulse: 92 (!) 101  Resp: 19 20  Temp: 98.2 F (36.8 C) 98.1 F (36.7 C)  SpO2: 97% 97%   Vitals:   01/21/19 2355 01/22/19 0439 01/22/19 0851 01/22/19 1117  BP: (!) 147/67 (!) 144/79 (!) 141/63 130/66  Pulse: 88 78 92 (!) 101  Resp: Temp: 98.6 F (37 C) 98.9 F (37.2 C) 98.2 F (36.8 C) 98.1 F (36.7 C)  TempSrc: Oral Oral Oral Oral  SpO2: 100% 99% 97% 97%  Weight:      Height:        General: Pt is alert, awake, not in acute distress Cardiovascular: RRR, S1/S2 +, no rubs, no gallops Respiratory: CTA bilaterally, no wheezing, no rhonchi Abdominal: Soft, NT, ND, bowel sounds + Extremities: no edema, no cyanosis    The results of significant diagnostics from this hospitalization (including imaging, microbiology, ancillary and laboratory) are  listed below for reference.     Microbiology: Recent Results (from the past 240  hour(s))  SARS CORONAVIRUS 2 (TAT 6-24 HRS) Nasopharyngeal Nasopharyngeal Swab     Status: None   Collection Time: 01/14/19  4:48 PM   Specimen: Nasopharyngeal Swab  Result Value Ref Range Status   SARS Coronavirus 2 NEGATIVE NEGATIVE Final    Comment: (NOTE) SARS-CoV-2 target nucleic acids are NOT DETECTED. The SARS-CoV-2 RNA is generally detectable in upper and lower respiratory specimens during the acute phase of infection. Negative results do not preclude SARS-CoV-2 infection, do not rule out co-infections with other pathogens, and should not be used as the sole basis for treatment or other patient management decisions. Negative results must be combined with clinical observations, patient history, and epidemiological information. The expected result is Negative. Fact Sheet for Patients: HairSlick.no Fact Sheet for Healthcare Providers: quierodirigir.com This test is not yet approved or cleared by the [place] FDA and  has been authorized for detection and/or diagnosis of SARS-CoV-2 by FDA under an Emergency Use Authorization (EUA). This EUA will remain  in effect (meaning this test can be used) for the duration of the COVID-19 declaration under Section 56 4(b)(1) of the Act, 21 U.S.C. section 360bbb-3(b)(1), unless the authorization is terminated or revoked sooner. Performed at [facility], [address] [place], [place] [address]      Labs: BNP (last 3 results) No results for input(s): BNP in the last 8760 hours. Basic Metabolic Panel: Recent Labs  Lab 01/16/19 0339 01/17/19 0844 01/18/19 0334 01/19/19 0402  NA 138 140 141 138  K 3.5 3.0* 4.4 4.5  CL 107 107 107 102  CO2 GLUCOSE 131* 113* 110* 95  BUN 6 <5* 8 12  CREATININE 0.75 0.57 0.73 0.79  CALCIUM 8.7* 8.9 9.3 9.4  MG 2.3  --  1.9 2.0   Liver Function Tests: Recent Labs  Lab 01/17/19 0844 01/18/19 0334  AST 35 37  ALT  15 18  ALKPHOS 73 89  BILITOT 0.3 0.2*  PROT 5.9* 6.9  ALBUMIN 2.9* 3.5   No results for input(s): LIPASE, AMYLASE in the last 168 hours. Recent Labs  Lab 01/17/19 0850  AMMONIA 41*   CBC: Recent Labs  Lab 01/16/19 0339 01/17/19 0844 01/20/19 0541  WBC 8.0 5.3 5.9  NEUTROABS 6.1 2.9 3.0  HGB 12.3 11.7* 12.0  HCT 37.1 35.5* 37.0  MCV 114.2* 114.1* 115.6*  PLT 400 398 462*   Cardiac Enzymes: Recent Labs  Lab 01/16/19 0339 01/17/19 0545 01/18/19 0334  CKTOTAL 1,762* 620* 374*   BNP: Invalid input(s): POCBNP CBG: Recent Labs  Lab 01/20/19 2157  GLUCAP 128*   D-Dimer No results for input(s): DDIMER in the last 72 hours. Hgb A1c No results for input(s): HGBA1C in the last 72 hours. Lipid Profile No results for input(s): CHOL, HDL, LDLCALC, TRIG, CHOLHDL, LDLDIRECT in the last 72 hours. Thyroid function studies No results for input(s): TSH, T4TOTAL, T3FREE, THYROIDAB in the last 72 hours.  Invalid input(s): FREET3 Anemia work up No results for input(s): VITAMINB12, FOLATE, FERRITIN, TIBC, IRON, RETICCTPCT in the last 72 hours. Urinalysis    Component Value Date/Time   COLORURINE YELLOW 01/15/2019 1211   APPEARANCEUR CLEAR 01/15/2019 1211   APPEARANCEUR Hazy 10/20/2013 1305   LABSPEC 1.009 01/15/2019 1211   LABSPEC 1.017 10/20/2013 1305   PHURINE 6.0 01/15/2019 1211   GLUCOSEU NEGATIVE 01/15/2019 1211   GLUCOSEU Negative 10/20/2013 1305  HGBUR NEGATIVE 01/15/2019 1211   BILIRUBINUR NEGATIVE 01/15/2019 1211   BILIRUBINUR Negative 10/20/2013 1305   KETONESUR NEGATIVE 01/15/2019 1211   PROTEINUR NEGATIVE 01/15/2019 1211   NITRITE NEGATIVE 01/15/2019 1211   LEUKOCYTESUR TRACE (A) 01/15/2019 1211   LEUKOCYTESUR Trace 10/20/2013 1305   Sepsis Labs Invalid input(s): PROCALCITONIN,  WBC,  LACTICIDVEN Microbiology Recent Results (from the past 240 hour(s))  SARS CORONAVIRUS 2 (TAT 6-24 HRS) Nasopharyngeal Nasopharyngeal Swab     Status: None   Collection  Time: 01/14/19  4:48 PM   Specimen: Nasopharyngeal Swab  Result Value Ref Range Status   SARS Coronavirus 2 NEGATIVE NEGATIVE Final    Comment: (NOTE) SARS-CoV-2 target nucleic acids are NOT DETECTED. The SARS-CoV-2 RNA is generally detectable in upper and lower respiratory specimens during the acute phase of infection. Negative results do not preclude SARS-CoV-2 infection, do not rule out co-infections with other pathogens, and should not be used as the sole basis for treatment or other patient management decisions. Negative results must be combined with clinical observations, patient history, and epidemiological information. The expected result is Negative. Fact Sheet for Patients: HairSlick.no Fact Sheet for Healthcare Providers: quierodirigir.com This test is not yet approved or cleared by the [place] FDA and  has been authorized for detection and/or diagnosis of SARS-CoV-2 by FDA under an Emergency Use Authorization (EUA). This EUA will remain  in effect (meaning this test can be used) for the duration of the COVID-19 declaration under Section 56 4(b)(1) of the Act, 21 U.S.C. section 360bbb-3(b)(1), unless the authorization is terminated or revoked sooner. Performed at [facility], [address] [place], [place] [address]     Please note: You were cared for by a hospitalist during your hospital stay. Once you are discharged, your primary care physician will handle any further medical issues. Please note that NO REFILLS for any discharge medications will be authorized once you are discharged, as it is imperative that you return to your primary care physician (or establish a relationship with a primary care physician if you do not have one) for your post hospital discharge needs so that they can reassess your need for medications and monitor your lab values.    Time coordinating discharge: 40  minutes  SIGNED:   Burnadette Pop, MD  [facility] 01/22/2019, 12:55 PM Pager [phone]  If 7PM-7AM, please contact night-coverage [email or web address] Password [email or web address]

## 2019-01-22 NOTE — TOC Transition Note (Signed)
Transition of Care [facility]) - CM/SW Discharge Note   Patient Details  Name: Ariel White MRN: [id number] Date of Birth: 12-14-1960  [facility] of [facility] CM/SW Contact:  Pollie Friar, RN Phone Number: 01/22/2019, 2:16 PM   Clinical Narrative:    Pt has progressed to where she is safe to d/c home. CM spoke with the patient and she prefers outpatient therapy over [facility]. Pt was in outpatient therapy prior to admission. Orders in Epic and information on the AVS. Pt states her sister will provide transport home.    Final next level of care: OP Rehab Barriers to Discharge: No Barriers Identified   Patient Goals and CMS Choice     Choice offered to / list presented to : Patient  Discharge Placement                       Discharge Plan and Services In-house Referral: NA   Post Acute Care Choice: Skilled Nursing Facility                               Social Determinants of Health (SDOH) Interventions     Readmission Risk Interventions No flowsheet data found.

## 2019-01-22 NOTE — Plan of Care (Signed)
Dc instructions given to patient at this time.  Pt verbalized understanding of all instructions.  No s/s of any acute distress.  Awaiting ride.

## 2019-01-22 NOTE — Progress Notes (Signed)
Occupational Therapy Treatment Patient Details Name: Ariel White MRN: [id number] DOB: 07-19-1960 Today's Date: 01/22/2019    History of present illness 58 y.o. female presenting with AMS as well as fall in bathroom and hit her head. CT of head showing no acute abnormalities. PMH including bipolar disorder, depression/anxiety, tobacco abuse, alcohol abuse, R shoulder hemiarthroplasty, and GERD.   OT comments  Pt states "I am going home to my apartment". Pt requesting home with home health at this time. Pt completed seated adl at sink this morning supervision level. Pt will need to continue sponge bath as (A) would be needed for static standing. Pt reports having family that can check on her. Pt educated on use of a purse or a fanny pack to hold phone instead of keeping it in her hand.   Follow Up Recommendations  Home health OT    Equipment Recommendations  None recommended by OT    Recommendations for Other Services      Precautions / Restrictions Precautions Precautions: Fall Restrictions Weight Bearing Restrictions: No LLE Weight Bearing: Weight bearing as tolerated       Mobility Bed Mobility               General bed mobility comments: oob at sink alone on arrival doing bath  Transfers Overall transfer level: Needs assistance   Transfers: Sit to/from Stand Sit to Stand: Supervision         General transfer comment: pt states i havent moved with that walker in three days. i dont need it    Balance             Standing balance-Leahy Scale: Good                             ADL either performed or assessed with clinical judgement   ADL Overall ADL's : Needs assistance/impaired Eating/Feeding: Supervision/ safety   Grooming: Oral care   Upper Body Bathing: Supervision/ safety   Lower Body Bathing: Supervison/ safety   Upper Body Dressing : Supervision/safety   Lower Body Dressing: Supervision/safety   Toilet Transfer:  Supervision/safety             General ADL Comments: pt completed full adl including applying lotion to legs. pt don doff socks with figure 4 cross     Vision   Additional Comments: wears glasses and has two pair due to glasses breaking during fall. Second pair is to just help hold lens from original glasses.    Perception     Praxis      Cognition Arousal/Alertness: Awake/alert Behavior During Therapy: WFL for tasks assessed/performed Overall Cognitive Status: Within Functional Limits for tasks assessed                                          Exercises Other Exercises Other Exercises: shoulder flexion ext abduction x5 x1 set   Shoulder Instructions       General Comments      Pertinent Vitals/ Pain       Pain Assessment: No/denies pain  Home Living                                          Prior Functioning/Environment  Frequency  Min 2X/week        Progress Toward Goals  OT Goals(current goals can now be found in the care plan section)  Progress towards OT goals: Progressing toward goals  Acute Rehab OT Goals Patient Stated Goal: Move by R arm so it does not get frozen OT Goal Formulation: With patient Time For Goal Achievement: 01/29/19 Potential to Achieve Goals: Good ADL Goals Pt Will Perform Grooming: standing;with set-up;with supervision Pt Will Perform Lower Body Dressing: with set-up;with supervision;sit to/from stand Pt Will Transfer to Toilet: with set-up;with supervision;ambulating;bedside commode Pt Will Perform Toileting - Clothing Manipulation and hygiene: sit to/from stand;sitting/lateral leans;with modified independence  Plan Discharge plan needs to be updated    Co-evaluation                 AM-PAC OT "6 Clicks" Daily Activity     Outcome Measure   Help from another person eating meals?: None Help from another person taking care of personal grooming?: A Little Help  from another person toileting, which includes using toliet, bedpan, or urinal?: A Little Help from another person bathing (including washing, rinsing, drying)?: A Little Help from another person to put on and taking off regular upper body clothing?: A Little Help from another person to put on and taking off regular lower body clothing?: A Little 6 Click Score: 19    End of Session    OT Visit Diagnosis: Unsteadiness on feet (R26.81);Other abnormalities of gait and mobility (R26.89);Muscle weakness (generalized) (M62.81);Pain   Activity Tolerance Patient tolerated treatment well   Patient Left in chair;with call bell/phone within reach   Nurse Communication Mobility status;Precautions        Time: 9528-4132 OT Time Calculation (min): 24 min  Charges: OT General Charges $OT Visit: 1 Visit OT Treatments $Self Care/Home Management : 8-22 mins   Mateo Flow, OTR/L  Acute Rehabilitation Services Pager: [phone] Office: [phone] .    Mateo Flow 01/22/2019, 10:47 AM

## 2019-01-22 NOTE — Progress Notes (Signed)
Physical Therapy Treatment Patient Details Name: Ariel White MRN: [id number] DOB: 1960-07-22 Today's Date: 01/22/2019    History of Present Illness 58 y.o. female presenting with AMS as well as fall in bathroom and hit her head. CT of head showing no acute abnormalities. PMH including bipolar disorder, depression/anxiety, tobacco abuse, alcohol abuse, R shoulder hemiarthroplasty, and GERD.    PT Comments    Patient seen for mobility progression.  Pt is making progress toward PT goals and tolerated session well. Pt requires supervision this session for gait and stair training. Pt does demonstrate mild balance deficits but without LOB or need for physical assistance. Pt with DGI score of 19/24. Given pt's progress recommend HHPT for further skilled PT to maximize independence and safety with mobility.    Follow Up Recommendations  Home health PT;Supervision - Intermittent     Equipment Recommendations  None recommended by PT    Recommendations for Other Services       Precautions / Restrictions Precautions Precautions: Fall Restrictions Weight Bearing Restrictions: No    Mobility  Bed Mobility               General bed mobility comments: pt up in chair upon arrival  Transfers Overall transfer level: Modified independent   Transfers: Sit to/from Stand Sit to Stand: Supervision         General transfer comment: pt states i havent moved with that walker in three days. i dont need it  Ambulation/Gait Ambulation/Gait assistance: Supervision Gait Distance (Feet): 200 Feet Assistive device: None Gait Pattern/deviations: Step-through pattern;Drifts right/left Gait velocity: decreased   General Gait Details: pt mildly unsteady; no LOB when given challenges   Stairs Stairs: Yes Stairs assistance: Supervision Stair Management: Two rails;Alternating pattern;Forwards Number of Stairs: (2 steps then 3 shorter height steps) General stair comments: supervision for  safety   Wheelchair Mobility    Modified Rankin (Stroke Patients Only)       Balance     Sitting balance-Leahy Scale: Normal       Standing balance-Leahy Scale: Good                   Standardized Balance Assessment Standardized Balance Assessment : Dynamic Gait Index   Dynamic Gait Index Level Surface: Mild Impairment Change in Gait Speed: Normal Gait with Horizontal Head Turns: Mild Impairment Gait with Vertical Head Turns: Mild Impairment Gait and Pivot Turn: Normal Step Over Obstacle: Normal Step Around Obstacles: Mild Impairment Steps: Mild Impairment Total Score: 19      Cognition Arousal/Alertness: Awake/alert Behavior During Therapy: WFL for tasks assessed/performed Overall Cognitive Status: Within Functional Limits for tasks assessed                                        Exercises Other Exercises Other Exercises: shoulder flexion ext abduction x5 x1 set    General Comments        Pertinent Vitals/Pain Pain Assessment: No/denies pain    Home Living                      Prior Function            PT Goals (current goals can now be found in the care plan section) Acute Rehab PT Goals Patient Stated Goal: Move by R arm so it does not get frozen Progress towards PT goals: Progressing toward goals  Frequency    Min 2X/week      PT Plan Discharge plan needs to be updated    Co-evaluation              AM-PAC PT "6 Clicks" Mobility   Outcome Measure  Help needed turning from your back to your side while in a flat bed without using bedrails?: None Help needed moving from lying on your back to sitting on the side of a flat bed without using bedrails?: None Help needed moving to and from a bed to a chair (including a wheelchair)?: A Little Help needed standing up from a chair using your arms (e.g., wheelchair or bedside chair)?: None Help needed to walk in hospital room?: A Little Help needed  climbing 3-5 steps with a railing? : A Little 6 Click Score: 21    End of Session Equipment Utilized During Treatment: Gait belt Activity Tolerance: Patient tolerated treatment well Patient left: in chair;with call bell/phone within reach Nurse Communication: Mobility status PT Visit Diagnosis: Unsteadiness on feet (R26.81);Repeated falls (R29.6);Difficulty in walking, not elsewhere classified (R26.2);Pain;Muscle weakness (generalized) (M62.81) Pain - part of body: (generalized)     Time: 9163-8466 PT Time Calculation (min) (ACUTE ONLY): 23 min  Charges:  $Gait Training: 23-37 mins                     Earney Navy, PTA Acute Rehabilitation Services Pager: [phone] Office: [phone]     Darliss Cheney 01/22/2019, 12:02 PM

## 2019-03-02 ENCOUNTER — Emergency Department: Payer: Medicare Other

## 2019-03-02 ENCOUNTER — Other Ambulatory Visit: Payer: Self-pay

## 2019-03-02 ENCOUNTER — Observation Stay
Admission: EM | Admit: 2019-03-02 | Discharge: 2019-03-03 | Disposition: A | Discharge status: Home / Self Care | Payer: Medicare Other | Attending: Internal Medicine | Admitting: Internal Medicine

## 2019-03-02 ENCOUNTER — Encounter: Payer: Self-pay | Admitting: Emergency Medicine

## 2019-03-02 DIAGNOSIS — K219 Gastro-esophageal reflux disease without esophagitis: Secondary | ICD-10-CM | POA: Diagnosis not present

## 2019-03-02 DIAGNOSIS — G8929 Other chronic pain: Secondary | ICD-10-CM | POA: Diagnosis present

## 2019-03-02 DIAGNOSIS — Y9389 Activity, other specified: Secondary | ICD-10-CM | POA: Diagnosis not present

## 2019-03-02 DIAGNOSIS — W502XXA Accidental twist by another person, initial encounter: Secondary | ICD-10-CM | POA: Diagnosis not present

## 2019-03-02 DIAGNOSIS — M797 Fibromyalgia: Secondary | ICD-10-CM | POA: Insufficient documentation

## 2019-03-02 DIAGNOSIS — G40909 Epilepsy, unspecified, not intractable, without status epilepticus: Secondary | ICD-10-CM | POA: Diagnosis not present

## 2019-03-02 DIAGNOSIS — F319 Bipolar disorder, unspecified: Secondary | ICD-10-CM | POA: Diagnosis present

## 2019-03-02 DIAGNOSIS — Z79899 Other long term (current) drug therapy: Secondary | ICD-10-CM | POA: Diagnosis not present

## 2019-03-02 DIAGNOSIS — S82831A Other fracture of upper and lower end of right fibula, initial encounter for closed fracture: Secondary | ICD-10-CM | POA: Diagnosis not present

## 2019-03-02 DIAGNOSIS — F17210 Nicotine dependence, cigarettes, uncomplicated: Secondary | ICD-10-CM | POA: Diagnosis not present

## 2019-03-02 DIAGNOSIS — S82409A Unspecified fracture of shaft of unspecified fibula, initial encounter for closed fracture: Secondary | ICD-10-CM | POA: Diagnosis present

## 2019-03-02 DIAGNOSIS — Z20828 Contact with and (suspected) exposure to other viral communicable diseases: Secondary | ICD-10-CM | POA: Insufficient documentation

## 2019-03-02 DIAGNOSIS — S82401A Unspecified fracture of shaft of right fibula, initial encounter for closed fracture: Secondary | ICD-10-CM | POA: Diagnosis present

## 2019-03-02 DIAGNOSIS — R569 Unspecified convulsions: Secondary | ICD-10-CM

## 2019-03-02 HISTORY — DX: Unspecified osteoarthritis, unspecified site: M19.90

## 2019-03-02 HISTORY — DX: Myoneural disorder, unspecified: G70.9

## 2019-03-02 HISTORY — DX: Fibromyalgia: M79.7

## 2019-03-02 HISTORY — DX: Personal history of other diseases of the digestive system: Z87.19

## 2019-03-02 LAB — SARS CORONAVIRUS 2 (TAT 6-24 HRS): SARS Coronavirus 2: NEGATIVE

## 2019-03-02 LAB — CBC
HCT: 39.4 % (ref 36.0–46.0)
Hemoglobin: 13.1 g/dL (ref 12.0–15.0)
MCH: 35.8 pg — ABNORMAL HIGH (ref 26.0–34.0)
MCHC: 33.2 g/dL (ref 30.0–36.0)
MCV: 107.7 fL — ABNORMAL HIGH (ref 80.0–100.0)
Platelets: 358 10*3/uL (ref 150–400)
RBC: 3.66 MIL/uL — ABNORMAL LOW (ref 3.87–5.11)
RDW: 14.2 % (ref 11.5–15.5)
WBC: 9.9 10*3/uL (ref 4.0–10.5)
nRBC: 0 % (ref 0.0–0.2)

## 2019-03-02 LAB — MAGNESIUM: Magnesium: 1.7 mg/dL (ref 1.7–2.4)

## 2019-03-02 LAB — CREATININE, SERUM
Creatinine, Ser: 0.81 mg/dL (ref 0.44–1.00)
GFR calc Af Amer: >60 mL/min (ref >60)
GFR calc non Af Amer: >60 mL/min (ref >60)

## 2019-03-02 MED ORDER — DOCUSATE SODIUM 100 MG PO CAPS
100.0000 mg | ORAL_CAPSULE | Freq: Two times a day (BID) | ORAL | Status: DC
  Administered 2019-03-02 – 2019-03-03 (×3): 100 mg via ORAL
  Filled 2019-03-02 (×3): qty 1

## 2019-03-02 MED ORDER — OXYCODONE-ACETAMINOPHEN 5-325 MG PO TABS
1.0000 | ORAL_TABLET | Freq: Once | ORAL | Status: AC
  Administered 2019-03-02: 05:00:00 1 via ORAL
  Filled 2019-03-02: qty 1

## 2019-03-02 MED ORDER — OXYCODONE HCL 5 MG PO TABS: 5.0000 mg | ORAL_TABLET | Freq: Three times a day (TID) | ORAL | 0 refills | Status: DC | PRN

## 2019-03-02 MED ORDER — VITAMIN B-1 100 MG PO TABS
100.0000 mg | ORAL_TABLET | Freq: Every day | ORAL | Status: DC
  Administered 2019-03-02 – 2019-03-03 (×2): 100 mg via ORAL
  Filled 2019-03-02 (×2): qty 1

## 2019-03-02 MED ORDER — PANTOPRAZOLE SODIUM 40 MG PO TBEC
40.0000 mg | DELAYED_RELEASE_TABLET | Freq: Every day | ORAL | Status: DC
  Administered 2019-03-02 – 2019-03-03 (×2): 40 mg via ORAL
  Filled 2019-03-02 (×2): qty 1

## 2019-03-02 MED ORDER — VITAMIN B-12 1000 MCG PO TABS
1000.0000 ug | ORAL_TABLET | Freq: Every day | ORAL | Status: DC
  Administered 2019-03-02 – 2019-03-03 (×2): 1000 ug via ORAL
  Filled 2019-03-02 (×2): qty 1

## 2019-03-02 MED ORDER — ONDANSETRON 4 MG PO TBDP
4.0000 mg | ORAL_TABLET | Freq: Once | ORAL | Status: AC
  Administered 2019-03-02: 4 mg via ORAL
  Filled 2019-03-02: qty 1

## 2019-03-02 MED ORDER — SODIUM CHLORIDE 0.9% FLUSH: 3.0000 mL | INTRAVENOUS | Status: DC | PRN

## 2019-03-02 MED ORDER — ENOXAPARIN SODIUM 40 MG/0.4ML ~~LOC~~ SOLN
40.0000 mg | SUBCUTANEOUS | Status: DC
  Administered 2019-03-02 – 2019-03-03 (×2): 40 mg via SUBCUTANEOUS
  Filled 2019-03-02 (×2): qty 0.4

## 2019-03-02 MED ORDER — HYDROCODONE-ACETAMINOPHEN 5-325 MG PO TABS
1.0000 | ORAL_TABLET | Freq: Four times a day (QID) | ORAL | Status: DC | PRN
  Administered 2019-03-02 – 2019-03-03 (×4): 1 via ORAL
  Filled 2019-03-02 (×4): qty 1

## 2019-03-02 MED ORDER — ONDANSETRON HCL 4 MG/2ML IJ SOLN: 4.0000 mg | Freq: Four times a day (QID) | INTRAMUSCULAR | Status: DC | PRN

## 2019-03-02 MED ORDER — TRAMADOL HCL 50 MG PO TABS: 50.0000 mg | ORAL_TABLET | Freq: Four times a day (QID) | ORAL | Status: DC | PRN

## 2019-03-02 MED ORDER — OLANZAPINE 2.5 MG PO TABS
2.5000 mg | ORAL_TABLET | Freq: Two times a day (BID) | ORAL | Status: DC
  Administered 2019-03-02 – 2019-03-03 (×3): 2.5 mg via ORAL
  Filled 2019-03-02 (×4): qty 1

## 2019-03-02 MED ORDER — SODIUM CHLORIDE 0.9 % IV SOLN: 250.0000 mL | INTRAVENOUS | Status: DC | PRN

## 2019-03-02 MED ORDER — ACETAMINOPHEN 325 MG PO TABS
650.0000 mg | ORAL_TABLET | ORAL | Status: DC | PRN
  Administered 2019-03-02: 650 mg via ORAL
  Filled 2019-03-02: qty 2

## 2019-03-02 MED ORDER — LAMOTRIGINE 100 MG PO TABS
100.0000 mg | ORAL_TABLET | Freq: Two times a day (BID) | ORAL | Status: DC
  Administered 2019-03-02 – 2019-03-03 (×3): 100 mg via ORAL
  Filled 2019-03-02 (×3): qty 1

## 2019-03-02 MED ORDER — SODIUM CHLORIDE 0.9% FLUSH
3.0000 mL | Freq: Two times a day (BID) | INTRAVENOUS | Status: DC
  Administered 2019-03-02 – 2019-03-03 (×3): 3 mL via INTRAVENOUS

## 2019-03-02 MED ORDER — ADULT MULTIVITAMIN W/MINERALS CH
1.0000 | ORAL_TABLET | Freq: Every day | ORAL | Status: DC
  Administered 2019-03-02 – 2019-03-03 (×2): 1 via ORAL
  Filled 2019-03-02 (×2): qty 1

## 2019-03-02 MED ORDER — MORPHINE SULFATE (PF) 2 MG/ML IV SOLN
2.0000 mg | INTRAVENOUS | Status: DC | PRN
  Administered 2019-03-02 (×3): 4 mg via INTRAVENOUS
  Administered 2019-03-02: 2 mg via INTRAVENOUS
  Administered 2019-03-03 (×3): 4 mg via INTRAVENOUS
  Filled 2019-03-02 (×3): qty 2
  Filled 2019-03-02: qty 1
  Filled 2019-03-02: qty 2
  Filled 2019-03-02: qty 1
  Filled 2019-03-02 (×2): qty 2

## 2019-03-02 NOTE — ED Triage Notes (Signed)
Pt to triage via w/c with no distress noted, mask in place; pt reports last night got up to the BR and rt ankle rolled; c/o persistent pain

## 2019-03-02 NOTE — H&P (Signed)
History and Physical    KIELEE CARE ZOX:[id number] DOB: May 09, 1960 DOA: 03/02/2019  **Will ask patient in observation status for PT evaluation to determine appropriate discharge disposition.  Pending PT evaluation patient may need to be changed to inpatient status if rehabilitative therapies in a skilled setting are requested.  PCP: Carren Rang, PA-C   Attending physician: Lynden Oxford MD  Patient coming from/Resides with: Private residence/lives alone  Chief Complaint: Mech fall-right LE pain  HPI: Ariel White is a 58 y.o. female with medical history significant for chronic pain secondary to fibromyalgia, bipolar disorder, seizure disorder, and GERD.  She presented to the ER after mechanical fall.  She reports difficulty getting out of her bed to ambulate to the bathroom due to the height of the mattress.  She dropped down to twisted her ankle and had significant pain.  She did not have any fall to the floor and did not hit her head or have any loss of consciousness.  Upon evaluation in the ER patient was hemodynamically stable and was found to have a closed mildly displaced right fibular fracture.  She was evaluated by the orthopedic team and this was deemed nonoperable was required to be nonweightbearing.  Splint was applied.  Patient was unable to mobilize safely and since she lives at home request was made to place patient in observation status for PT evaluation and to determine safe discharge plan.  ED Course:  Vital Signs: BP 126/76   Pulse 98   Temp 98.1 F (36.7 C) (Oral)   Resp 16   Ht  (1.575 m)   Wt 53.5 kg   SpO2 93%   BMI 21.58 kg/m   As above  Review of Systems:  In addition to the HPI above,  No Fever-chills, myalgias or other constitutional symptoms No Headache, changes with Vision or hearing, new weakness, tingling, numbness in any extremity, dizziness, dysarthria or word finding difficulty, gait disturbance or imbalance, tremors or seizure activity No  problems swallowing food or Liquids, indigestion/reflux, choking or coughing while eating, abdominal pain with or after eating No Chest pain, Cough or Shortness of Breath, palpitations, orthopnea or DOE No Abdominal pain, N/V, melena,hematochezia, dark tarry stools, constipation No dysuria, malodorous urine, hematuria or flank pain No new skin rashes, lesions, masses or bruises, No new joint pains, aches, swelling or redness No recent unintentional weight gain or loss No polyuria, polydypsia or polyphagia   Past Medical History:  Diagnosis Date  . Anxiety   . Arthritis   . Bipolar 1 disorder (HCC)   . Chronic pain   . Depression   . Eczema   . Fibromyalgia   . GERD (gastroesophageal reflux disease)   . Headache    migraines  . History of hiatal hernia   . Neuromuscular disorder (HCC)   . Seizures (HCC)     Past Surgical History:  Procedure Laterality Date  . ABDOMINAL HYSTERECTOMY    . APPENDECTOMY    . BRAIN TUMOR EXCISION  1985  . SHOULDER HEMI-ARTHROPLASTY Right 11/14/2018   Procedure: SHOULDER HEMI-ARTHROPLASTY;  Surgeon: Christena Flake, MD;  Location: [facility] ORS;  Service: Orthopedics;  Laterality: Right;  . TUBAL LIGATION      Social History   Socioeconomic History  . Marital status: Divorced    Spouse name: Not on file  . Number of children: Not on file  . Years of education: Not on file  . Highest education level: Not on file  Occupational History  . Not  on file  Social Needs  . Financial resource strain: Not on file  . Food insecurity    Worry: Not on file    Inability: Not on file  . Transportation needs    Medical: Not on file    Non-medical: Not on file  Tobacco Use  . Smoking status: Current Every Day Smoker    Packs/day: 0.25    Types: Cigarettes  . Smokeless tobacco: Never Used  Substance and Sexual Activity  . Alcohol use: Yes    Alcohol/week: 2.0 standard drinks    Types: 2 Cans of beer per week    Comment: once per month  . Drug use: Not  Currently    Comment: 3 TO 5 YEARS  . Sexual activity: Not on file  Lifestyle  . Physical activity    Days per week: Not on file    Minutes per session: Not on file  . Stress: Not on file  Relationships  . Social Musicianconnections    Talks on phone: Not on file    Gets together: Not on file    Attends religious service: Not on file    Active member of club or organization: Not on file    Attends meetings of clubs or organizations: Not on file    Relationship status: Not on file  . Intimate partner violence    Fear of current or ex partner: Not on file    Emotionally abused: Not on file    Physically abused: Not on file    Forced sexual activity: Not on file  Other Topics Concern  . Not on file  Social History Narrative  . Not on file    Mobility:  Prior to admission and recent injury was independent Patient with history of right shoulder surgery in May 2020 and was enrolled in outpatient PT for this  Work history:  Not obtained   Allergies  Allergen Reactions  . Compazine [Prochlorperazine Edisylate] Other (See Comments)    euphoria  . Ivp Dye [Iodinated Diagnostic Agents] Anaphylaxis    "stopped her heart"  . Baclofen Other (See Comments)    dizziness  . Imitrex [Sumatriptan] Swelling    "throat swelling"  . Voltaren [Diclofenac] Other (See Comments)    Cannot hold onto objects (drops things)    Family History  Problem Relation Age of Onset  . Breast cancer Mother        3170's   Family history reviewed and not pertinent   Prior to Admission medications   Medication Sig Start Date End Date Taking? Authorizing Provider  lamoTRIgine (LAMICTAL) 25 MG tablet Take 100 mg by mouth 2 (two) times daily.    Yes [provider]  OLANZapine (ZYPREXA) 2.5 MG tablet Take 1 tablet (2.5 mg total) by mouth 2 (two) times daily. 01/22/19  Yes Burnadette PopAdhikari, Amrit, MD  pantoprazole (PROTONIX) 40 MG tablet Take 1 tablet (40 mg total) by mouth daily. 01/23/19  Yes Burnadette PopAdhikari, Amrit,  MD  traMADol (ULTRAM) 50 MG tablet Take 1 tablet (50 mg total) by mouth every 6 (six) hours as needed for moderate pain. 11/15/18  Yes Anson OregonMcGhee, James Lance, PA-C  acetaminophen (TYLENOL ARTHRITIS PAIN) 650 MG CR tablet Take 1,300 mg by mouth 2 (two) times daily.     [provider]  Alum Hydroxide-Mag Carbonate (GAVISCON EXTRA STRENGTH) 160-105 MG CHEW Chew 4 tablets by mouth 2 (two) times daily as needed (bloating).     [provider]  Aspirin-Salicylamide-Caffeine (ARTHRITIS STRENGTH BC POWDER PO) Take  1 packet by mouth 2 (two) times daily as needed (pain).    [provider]  diphenhydramine-acetaminophen (TYLENOL PM) 25-500 MG TABS tablet Take 2 tablets by mouth at bedtime as needed (sleep).     [provider]  folic acid (FOLVITE) 1 MG tablet Take 1 tablet (1 mg total) by mouth daily. Patient not taking: Reported on 03/02/2019 01/23/19   Burnadette Pop, MD  neomycin-bacitracin-polymyxin (NEOSPORIN) OINT Apply 1 application topically 2 (two) times daily as needed for wound care.    [provider]  oxyCODONE (ROXICODONE) 5 MG immediate release tablet Take 1 tablet (5 mg total) by mouth every 8 (eight) hours as needed for up to 7 days. 03/02/19 03/09/19  Concha Se, MD  thiamine (VITAMIN B-1) 100 MG tablet Take 1 tablet (100 mg total) by mouth daily. Patient not taking: Reported on 03/02/2019 01/22/19   Burnadette Pop, MD  vitamin B-12 1000 MCG tablet Take 1 tablet (1,000 mcg total) by mouth daily. Patient not taking: Reported on 03/02/2019 01/23/19   Burnadette Pop, MD    Physical Exam: Vitals:   03/02/19 0530 03/02/19 0743 03/02/19 0745 03/02/19 0800  BP: (!) 128/94 (!) 125/97 140/83 126/76  Pulse:  (!) 102 97 98  Resp:  16    Temp:      TempSrc:      SpO2:  94% 94% 93%  Weight:      Height:          Constitutional: NAD, calm, mildly uncomfortable secondary to ongoing right lower extremity discomfort Eyes: PERRL, lids and conjunctivae  normal ENMT: Mucous membranes are moist. Posterior pharynx clear of any exudate or lesions.Normal dentition.  Neck: normal, supple, no masses, no thyromegaly Respiratory: clear to auscultation bilaterally, no wheezing, no crackles. Normal respiratory effort. No accessory muscle use.  Cardiovascular: Regular rate and rhythm, no murmurs / rubs / gallops. No extremity edema. 2+ pedal pulses. No carotid bruits.  Abdomen: no tenderness, no masses palpated. No hepatosplenomegaly. Bowel sounds positive.  Musculoskeletal: no clubbing / cyanosis. No joint deformity upper and lower extremities. Good ROM, no contractures. Normal muscle tone.  Patient with splint to right lower extremity below knee and involving foot Skin: no rashes, lesions, ulcers. No induration Neurologic: CN 2-12 grossly intact. Sensation intact, DTR normal. Strength 5/5 x all 4 extremities.  Psychiatric: Normal judgment and insight. Alert and oriented x 3. Normal mood.    Labs on Admission: I have personally reviewed following labs and imaging studies  CBC: Recent Labs  Lab 03/02/19 0812  WBC 9.9  HGB 13.1  HCT 39.4  MCV 107.7*  PLT 358   Basic Metabolic Panel: Recent Labs  Lab 03/02/19 0812  CREATININE 0.81  MG 1.7   GFR: Estimated Creatinine Clearance: 60.6 mL/min (by C-G formula based on SCr of 0.81 mg/dL). Liver Function Tests: No results for input(s): AST, ALT, ALKPHOS, BILITOT, PROT, ALBUMIN in the last 168 hours. No results for input(s): LIPASE, AMYLASE in the last 168 hours. No results for input(s): AMMONIA in the last 168 hours. Coagulation Profile: No results for input(s): INR, PROTIME in the last 168 hours. Cardiac Enzymes: No results for input(s): CKTOTAL, CKMB, CKMBINDEX, TROPONINI in the last 168 hours. BNP (last 3 results) No results for input(s): PROBNP in the last 8760 hours. HbA1C: No results for input(s): HGBA1C in the last 72 hours. CBG: No results for input(s): GLUCAP in the last 168  hours. Lipid Profile: No results for input(s): CHOL, HDL, LDLCALC, TRIG, CHOLHDL, LDLDIRECT  in the last 72 hours. Thyroid Function Tests: No results for input(s): TSH, T4TOTAL, FREET4, T3FREE, THYROIDAB in the last 72 hours. Anemia Panel: No results for input(s): VITAMINB12, FOLATE, FERRITIN, TIBC, IRON, RETICCTPCT in the last 72 hours. Urine analysis:    Component Value Date/Time   COLORURINE YELLOW 01/15/2019 1211   APPEARANCEUR CLEAR 01/15/2019 1211   APPEARANCEUR Hazy 10/20/2013 1305   LABSPEC 1.009 01/15/2019 1211   LABSPEC 1.017 10/20/2013 1305   PHURINE 6.0 01/15/2019 1211   GLUCOSEU NEGATIVE 01/15/2019 1211   GLUCOSEU Negative 10/20/2013 1305   HGBUR NEGATIVE 01/15/2019 1211   BILIRUBINUR NEGATIVE 01/15/2019 1211   BILIRUBINUR Negative 10/20/2013 1305   [place] 01/15/2019 1211   PROTEINUR NEGATIVE 01/15/2019 1211   NITRITE NEGATIVE 01/15/2019 1211   LEUKOCYTESUR TRACE (A) 01/15/2019 1211   LEUKOCYTESUR Trace 10/20/2013 1305   Sepsis Labs: @LABRCNTIP (procalcitonin:4,lacticidven:4) )No results found for this or any previous visit (from the past 240 hour(s)).   Radiological Exams on Admission: Dg Tibia/fibula Right  Result Date: 03/02/2019 CLINICAL DATA:  Pain EXAM: RIGHT TIBIA AND FIBULA - 2 VIEW COMPARISON:  None. FINDINGS: There is an acute mildly displaced fracture involving the distal fibula with surrounding soft tissue swelling. There is no evidence for a fracture involving the proximal tibia or fibula. IMPRESSION: Acute mildly displaced fracture of the fibula. Electronically Signed   By: Constance Holster M.D.   On: 03/02/2019 05:24   Dg Ankle Complete Right  Result Date: 03/02/2019 CLINICAL DATA:  Rolled ankle.  Pain EXAM: RIGHT ANKLE - COMPLETE 3+ VIEW COMPARISON:  None. FINDINGS: There is an oblique fracture through the distal left fibular metaphysis. No tibial abnormality. No significant displacement. Ankle mortise is intact. IMPRESSION: Oblique  nondisplaced distal fibular metaphyseal fracture. Electronically Signed   By: Rolm Baptise M.D.   On: 03/02/2019 03:49   Dg Foot 2 Views Right  Result Date: 03/02/2019 CLINICAL DATA:  Pain EXAM: RIGHT FOOT - 2 VIEW COMPARISON:  None. FINDINGS: There is an acute mildly displaced fracture involving the distal fibula with surrounding soft tissue swelling. There is no dislocation. IMPRESSION: Acute fracture of the distal fibula. Electronically Signed   By: Constance Holster M.D.   On: 03/02/2019 05:26     Assessment/Plan Primary problem: Closed right fibula fracture Patient presents as above Initial orthopedic recommendation was to discharge home given this was a nonoperative injury and to follow-up with their office otherwise to be nonweightbearing on crutches Patient unable to safely manage based on evaluation in ER, lives alone PT evaluation to assist with discharge disposition Utilize both narcotic and nonnarcotic methods for pain management Ambulate with assistance  Active Problems:  Chronic pain/fibromyalgia Continue preadmission Ultram Can utilize Vicodin and IV morphine for pain related to acute fracture    Bipolar 1 disorder ([place]) Continue preadmission Zyprexa    GERD (gastroesophageal reflux disease) Continue preadmission Protonix    ?  Seizure disorder Continue preadmission Lamictal    **Additional lab, imaging and/or diagnostic evaluation at discretion of supervising physician  DVT prophylaxis:  Lovenox  Code Status:  Full  Family Communication:  Patient-lives alone  Disposition Plan:  Observation  Consults called:  Orthopedic physician consulted by EDP with no acute operative intervention necessary    Erin Hearing ANP-BC [facility] Pager [phone]   If 7PM-7AM, please contact night-coverage [email or web address] Password [email or web address]  03/02/2019, 9:10 AM

## 2019-03-02 NOTE — ED Notes (Signed)
Pt states pain inc again. Understands must wait for morphine to be out of system to try Norco by itself if wants to possibly be d/c home per verbal from provider Posey Pronto. Verbal from [facility] to wait 3hrs after morphine given to give Norco if pt agreeable. Pt agreeable to this currently. Pt given cup of ice as requested.

## 2019-03-02 NOTE — ED Notes (Signed)
Pt given ginger ale and paper towel

## 2019-03-02 NOTE — ED Notes (Signed)
Floor given this RN's name and ascom number. State they will call this RN back soon.

## 2019-03-02 NOTE — ED Notes (Signed)
Nicole NT at bedside.

## 2019-03-02 NOTE — ED Notes (Addendum)
Pt up to bedside toilet with walker. Cap refill<3 in toes bilat; appropriate warmth and color bilat.

## 2019-03-02 NOTE — TOC Initial Note (Signed)
Transition of Care [facility]) - Initial/Assessment Note    Patient Details  Name: Ariel White MRN: [id number] Date of Birth: March 24, 1961  [facility] CM/SW Contact:    Anselm Pancoast, RN Phone Number: 03/02/2019, 11:49 AM  Clinical Narrative:                 58 year old female admitted after falling and suffering ankle fracture. Patient lives alone however strong family support and plans to move in with sister for a short period of time after discharge. Patient worked with physical therapy and able to move around with walker. Patient has used [facility] in the past and would like to resume care with them. Patient is eager to discharge home and waiting for callback from her sister to confirm ride home.   Expected Discharge Plan: [place] Barriers to Discharge: Continued Medical Work up   Patient Goals and CMS Choice Patient states their goals for this hospitalization and ongoing recovery are:: discharge home with sister and continue therapy      Expected Discharge Plan and Services Expected Discharge Plan: [place] In-house Referral: Clinical Social Work Discharge Planning Services: CM Consult   Living arrangements for the past 2 months: Single Family Home                 DME Arranged: Environmental consultant                    Prior Living Arrangements/Services Living arrangements for the past 2 months: Single Family Home Lives with:: Self Patient language and need for interpreter reviewed:: Yes Do you feel safe going back to the place where you live?: Yes      Need for Family Participation in Patient Care: Yes (Comment) Care giver support system in place?: Yes (comment) Current home services: Other (comment)(Currently active with OP physical therapy) Criminal Activity/Legal Involvement Pertinent to Current Situation/Hospitalization: No - Comment as needed  Activities of Daily Living      Permission  Sought/Granted Permission sought to share information with : Case Manager                Emotional Assessment Appearance:: Appears stated age Attitude/Demeanor/Rapport: Engaged, Gracious Affect (typically observed): Accepting, Appropriate Orientation: : Oriented to Self, Oriented to Place, Oriented to  Time, Oriented to Situation Alcohol / Substance Use: Never Used Psych Involvement: No (comment)  Admission diagnosis:  R Ankle Inj Patient Active Problem List   Diagnosis Date Noted  . Fibula fracture 03/02/2019  . Closed right fibular fracture 03/02/2019  . Bipolar 1 disorder ([place])   . GERD (gastroesophageal reflux disease)   . Seizures ([place])   . Syncope 01/15/2019  . Altered mental state 01/14/2019  . Tobacco abuse 01/14/2019  . Anxiety 01/14/2019  . Rhabdomyolysis 01/14/2019  . Macrocytosis 01/14/2019  . Status post fall 01/14/2019  . Fall   . Avascular necrosis of head of humerus ([place]) 11/14/2018  . Fibromyalgia 09/17/2015  . Myofascial pain syndrome 08/29/2015  . DDD (degenerative disc disease), cervical 08/29/2015  . Pain in joint, shoulder region 08/29/2015  . Chronic pain 08/29/2015  . Depression, unspecified 02/01/2015  . Alcohol use disorder, mild, abuse 02/01/2015  . 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

## 2019-03-02 NOTE — ED Notes (Signed)
Pt resting calmly in bed. Alert. Bed locked low. Call bell within reach. Rail up.  

## 2019-03-02 NOTE — ED Notes (Signed)
Ankle splint applied, posterior short leg/sugar tong. This tech demonstrated proper technique using crutches to pt. Pt unable to catch balance using crutches. RN notified

## 2019-03-02 NOTE — ED Notes (Signed)
Georgie RN, aware of bed assigned  

## 2019-03-02 NOTE — Evaluation (Addendum)
Physical Therapy Evaluation Patient Details Name: Ariel White MRN: [id number] DOB: 1961-01-29 Today's Date: 03/02/2019   History of Present Illness  Ariel White is a 8yoF who comes to [facility] on 11/5 after rolling ankle in BR at home, found to have sustained fibular fracture. Pt was at [facility] last month after a fall in BR hitting her head. PMH including bipolar disorder, depression/anxiety, tobacco abuse, alcohol abuse, R shoulder hemiarthroplasty, and GERD.  Clinical Impression  Pt admitted with above diagnosis. Pt currently with functional limitations due to the deficits listed below (see "PT Problem List"). Upon entry, pt in bed, awake and agreeable to participate. The pt is alert and oriented x4, pleasant, conversational, and generally a good historian. Pt already failed trial of AMB with AC with RN prior to entry. Educated pt on use of RW for transfers, NWB restrictions of RLE, and AMB with RW. ModI performance of bed mobility and transfers, supervision for AMB with RW. Pt reports confidence with mobility. No stairs training performed at this time, pt may need EMS transport if returning to home at DC. Pt encouraged to talk to her sister about staying with her for a week at DC. Functional mobility assessment demonstrates increased effort/time requirements, good tolerance, but no need for physical assistance, whereas the patient performed these at a higher level of independence PTA. Pt does have an extensive falls history and should have intermittent supervision at DC. Pt will benefit from skilled PT intervention to increase independence and safety with basic mobility in preparation for discharge to the venue listed below.       Follow Up Recommendations Home health PT, intermittent supervision    Equipment Recommendations  Rolling walker with 5" wheels    Recommendations for Other Services       Precautions / Restrictions Precautions Precautions: Fall Restrictions Weight Bearing Restrictions:  No(no orders found, presumptive NWB in session to determine potential for mobility)      Mobility  Bed Mobility Overal bed mobility: Modified Independent                Transfers Overall transfer level: Modified independent Equipment used: Rolling walker (2 wheeled)             General transfer comment: able to rise to standing c RLE NWB from EOB and from chair (lower surface)  Ambulation/Gait Ambulation/Gait assistance: Supervision Gait Distance (Feet): 45 Feet Assistive device: Rolling walker (2 wheeled) Gait Pattern/deviations: WFL(Within Functional Limits)     General Gait Details: one legged hop-to gait  Stairs            Wheelchair Mobility    Modified Rankin (Stroke Patients Only)       Balance Overall balance assessment: Modified Independent;History of Falls                                           Pertinent Vitals/Pain Pain Assessment: Faces Faces Pain Scale: Hurts little more Pain Location: Right foreleg Pain Descriptors / Indicators: Aching Pain Intervention(s): Limited activity within patient's tolerance;Monitored during session;Premedicated before session;Repositioned    Home Living Family/patient expects to be discharged to:: Private residence Living Arrangements: Alone Available Help at Discharge: Family;Available PRN/intermittently Type of Home: Mobile home Home Access: Stairs to enter Entrance Stairs-Rails: Can reach both Entrance Stairs-Number of Steps: 4 Home Layout: One level Home Equipment: Cane - single point;[place] held shower head;Bedside commode  Additional Comments: pulleys at home from prior Rt TSA    Prior Function Level of Independence: Needs assistance   Gait / Transfers Assistance Needed: Not using DME for mobility prior to admission  ADL's / Homemaking Assistance Needed: Pt reports she was performing all IADLs and ADLs prior to her last admission. Since last admission, she was  having meals delivered and was not driving.  Comments: able to perform IADLs with independence     Hand Dominance   Dominant Hand: Right    Extremity/Trunk Assessment   Upper Extremity Assessment Upper Extremity Assessment: Overall WFL for tasks assessed    Lower Extremity Assessment Lower Extremity Assessment: Overall WFL for tasks assessed    Cervical / Trunk Assessment Cervical / Trunk Assessment: Normal  Communication   Communication: No difficulties  Cognition Arousal/Alertness: Awake/alert Behavior During Therapy: WFL for tasks assessed/performed Overall Cognitive Status: Within Functional Limits for tasks assessed                                        General Comments      Exercises     Assessment/Plan    PT Assessment Patient needs continued PT services  PT Problem List Decreased range of motion;Decreased activity tolerance;Decreased balance;Decreased mobility;Decreased knowledge of use of DME       PT Treatment Interventions DME instruction;Balance training;Gait training;Stair training;Functional mobility training;Therapeutic activities;Therapeutic exercise;Manual techniques;Wheelchair mobility training;Patient/family education;Cognitive remediation;Neuromuscular re-education;Modalities    PT Goals (Current goals can be found in the Care Plan section)  Acute Rehab PT Goals Patient Stated Goal: return to home, not go to rehab PT Goal Formulation: With patient Time For Goal Achievement: 03/16/19 Potential to Achieve Goals: Good    Frequency 7X/week   Barriers to discharge   has steps at home, lives alone, shoulder go to sister's home at DC if possible    Co-evaluation               AM-PAC PT "6 Clicks" Mobility  Outcome Measure Help needed turning from your back to your side while in a flat bed without using bedrails?: None Help needed moving from lying on your back to sitting on the side of a flat bed without using  bedrails?: None Help needed moving to and from a bed to a chair (including a wheelchair)?: None Help needed standing up from a chair using your arms (e.g., wheelchair or bedside chair)?: None Help needed to walk in hospital room?: A Little Help needed climbing 3-5 steps with a railing? : A Little 6 Click Score: 22    End of Session Equipment Utilized During Treatment: Gait belt(sugar tongs splint) Activity Tolerance: Patient tolerated treatment well;No increased pain Patient left: in bed;with call bell/phone within reach;Other (comment)(limb elevated) Nurse Communication: Mobility status(EMS transport home) PT Visit Diagnosis: Unsteadiness on feet (R26.81);Other abnormalities of gait and mobility (R26.89);Difficulty in walking, not elsewhere classified (R26.2)    Time: 8938-1017 PT Time Calculation (min) (ACUTE ONLY): 17 min   Charges:   PT Evaluation $PT Eval Moderate Complexity: 1 Mod PT Treatments $Gait Training: 8-22 mins        11:58 AM, 03/02/19 Rosamaria Lints, PT, DPT Physical Therapist - [facility]  [phone] (ASCOM)   Buccola,Allan C 03/02/2019, 11:58 AM

## 2019-03-02 NOTE — ED Notes (Signed)
This RN made MD aware that patient is unable to walk on crutches due to balance and lives by herself.

## 2019-03-02 NOTE — ED Notes (Signed)
Pt given meal tray.

## 2019-03-02 NOTE — ED Notes (Signed)
Ellis,Allison NP at bedside

## 2019-03-02 NOTE — ED Notes (Signed)
Pt states found out she cannot stay with her sister. States would have to go home alone using walker/crutches. States doctor told her she was too unsteady for that and would likely end up falling at home and, therefore, needs to be admitted. States doesn't want to wait for pain meds bc of this. States wants whatever pain meds she can have right now. Denies any other needs.

## 2019-03-02 NOTE — ED Notes (Signed)
PT at bedside.

## 2019-03-02 NOTE — ED Notes (Signed)
PT states that she walked well with walker

## 2019-03-02 NOTE — ED Notes (Signed)
Pt states pain unchanged post PO pain med. Requesting more pain meds.

## 2019-03-02 NOTE — ED Notes (Addendum)
Pt c/o inc pain again. Requesting pain meds. Educated about another hour before able to receive pain med again. Pt understands. Pt continues to watch tv.

## 2019-03-02 NOTE — ED Provider Notes (Signed)
[facility] Emergency Department Provider Note  ____________________________________________   First MD Initiated Contact with Patient 03/02/19 [phone]     (approximate)  I have reviewed the triage vital signs and the nursing notes.   HISTORY  Chief Complaint Ankle Pain    HPI Ariel White is a 58 y.o. female with depression who presents with mechanical fall.  Patient states that she got up around 11 to the bathroom when she rolled her right ankle and has had severe pain that is constant, worse with moving the leg, better at rest.  She denies falling or hitting her head.  She is not on any blood thinners.  She denies loss of consciousness or chest pain or shortness of breath prior to the fall.          Past Medical History:  Diagnosis Date  . Anxiety   . Bipolar 1 disorder (HCC)   . Chronic pain   . Depression   . Eczema   . GERD (gastroesophageal reflux disease)   . Headache    migraines  . Seizures [facility])     Patient Active Problem List   Diagnosis Date Noted  . Syncope 01/15/2019  . Altered mental state 01/14/2019  . Tobacco abuse 01/14/2019  . Anxiety 01/14/2019  . Rhabdomyolysis 01/14/2019  . Macrocytosis 01/14/2019  . Status post fall 01/14/2019  . Fall   . 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) 11/14/2018  . Fibromyalgia 09/17/2015  . Myofascial pain syndrome 08/29/2015  . DDD (degenerative disc disease), cervical 08/29/2015  . Pain in joint, shoulder region 08/29/2015  . Chronic thoracic back pain 08/29/2015  . Depression, unspecified 02/01/2015  . Alcohol use disorder, mild, abuse 02/01/2015  . 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 ABDOMINAL HYSTERECTOMY    . APPENDECTOMY    . BRAIN TUMOR EXCISION  1985  . SHOULDER HEMI-ARTHROPLASTY Right 11/14/2018   Procedure: SHOULDER HEMI-ARTHROPLASTY;  Surgeon: Christena Flake, MD;   Location: [facility] ORS;  Service: Orthopedics;  Laterality: Right;  . TUBAL LIGATION      Prior to Admission medications   Medication Sig Start Date End Date Taking? Authorizing Provider  acetaminophen (TYLENOL ARTHRITIS PAIN) 650 MG CR tablet Take 1,300 mg by mouth 2 (two) times daily.     [provider]  Alum Hydroxide-Mag Carbonate (GAVISCON EXTRA STRENGTH) 160-105 MG CHEW Chew 4 tablets by mouth 2 (two) times daily as needed (bloating).     [provider]  aspirin EC 325 MG tablet Take 1 tablet (325 mg total) by mouth daily. Patient not taking: Reported on 01/14/2019 11/15/18   Anson Oregon, PA-C  Aspirin-Salicylamide-Caffeine (ARTHRITIS STRENGTH BC POWDER PO) Take 1 packet by mouth 2 (two) times daily as needed (pain).    [provider]  diphenhydramine-acetaminophen (TYLENOL PM) 25-500 MG TABS tablet Take 2 tablets by mouth at bedtime as needed (sleep).     [provider]  folic acid (FOLVITE) 1 MG tablet Take 1 tablet (1 mg total) by mouth daily. 01/23/19   Burnadette Pop, MD  lamoTRIgine (LAMICTAL) 25 MG tablet Take 50 mg by mouth 2 (two) times daily.    [provider]  neomycin-bacitracin-polymyxin (NEOSPORIN) OINT Apply 1 application topically 2 (two) times daily as needed for wound care.    [provider]  OLANZapine (ZYPREXA) 2.5 MG tablet Take 1 tablet (2.5 mg total) by mouth 2 (two)  times daily. 01/22/19   Shelly Coss, MD  ondansetron (ZOFRAN) 4 MG tablet Take 1 tablet (4 mg total) by mouth every 6 (six) hours as needed for nausea. Patient not taking: Reported on 01/14/2019 11/15/18   Lattie Corns, PA-C  pantoprazole (PROTONIX) 40 MG tablet Take 1 tablet (40 mg total) by mouth daily. 01/23/19   Shelly Coss, MD  thiamine (VITAMIN B-1) 100 MG tablet Take 1 tablet (100 mg total) by mouth daily. 01/22/19   Shelly Coss, MD  traMADol (ULTRAM) 50 MG tablet Take 1 tablet (50 mg total) by mouth every 6 (six) hours as  needed for moderate pain. Patient not taking: Reported on 01/14/2019 11/15/18   Lattie Corns, PA-C  vitamin B-12 1000 MCG tablet Take 1 tablet (1,000 mcg total) by mouth daily. 01/23/19   Shelly Coss, MD    Allergies Compazine [prochlorperazine edisylate], Ivp dye [iodinated diagnostic agents], Baclofen, Imitrex [sumatriptan], and Voltaren [diclofenac]  Family History  Problem Relation Age of Onset  . Breast cancer Mother        79's    Social History Social History   Tobacco Use  . Smoking status: Current Every Day Smoker    Packs/day: 0.25    Types: Cigarettes  . Smokeless tobacco: Never Used  Substance Use Topics  . Alcohol use: No    Alcohol/week: 0.0 standard drinks  . Drug use: Not Currently    Comment: 3 TO 5 YEARS      Review of Systems Constitutional: No fever/chills Eyes: No visual changes. ENT: No sore throat. Cardiovascular: Denies chest pain. Respiratory: Denies shortness of breath. Gastrointestinal: No abdominal pain.  No nausea, no vomiting.  No diarrhea.  No constipation. Genitourinary: Negative for dysuria. Musculoskeletal: Negative for back pain.  Positive ankle pain Skin: Negative for rash. Neurological: Negative for headaches, focal weakness or numbness. All other ROS negative ____________________________________________   PHYSICAL EXAM:  VITAL SIGNS: ED Triage Vitals  Enc Vitals Group     BP 03/02/19 0322 133/72     Pulse Rate 03/02/19 0322 (!) 115     Resp 03/02/19 0322 18     Temp 03/02/19 0322 98.1 F (36.7 C)     Temp Source 03/02/19 0322 Oral     SpO2 03/02/19 0322 97 %     Weight 03/02/19 0322 118 lb (53.5 kg)     Height 03/02/19 0322 5\' 2"  (1.575 m)     Head Circumference --      Peak Flow --      Pain Score 03/02/19 0324 8     Pain Loc --      Pain Edu? --      Excl. in [place]? --     Constitutional: Alert and oriented. Well appearing and in no acute distress. Eyes: Conjunctivae are normal. EOMI. Head:  Atraumatic. Nose: No congestion/rhinnorhea. Mouth/Throat: Mucous membranes are moist.   Neck: No stridor. Trachea Midline. FROM Cardiovascular: Tachycardic, regular rhythm. Grossly normal heart sounds.  Good peripheral circulation. Respiratory: Normal respiratory effort.  No retractions. Lungs CTAB. Gastrointestinal: Soft and nontender. No distention. No abdominal bruits.  Musculoskeletal: Tenderness to the fibular area with some mild swelling.  No open skin.  Some tenderness below the knee as well as on the foot as well.  2+ distal pulse.  Able to dorsiflex and plantar flex. Neurologic:  Normal speech and language. No gross focal neurologic deficits are appreciated.  Skin:  Skin is warm, dry and intact. No rash noted. Psychiatric: Mood and affect  are normal. Speech and behavior are normal. GU: Deferred   ____________________________________________    Vela Prose, personally viewed and evaluated these images (plain radiographs) as part of my medical decision making, as well as reviewing the written report by the radiologist.  ED MD interpretation: Fibular fracture  Official radiology report(s): Dg Tibia/fibula Right  Result Date: 03/02/2019 CLINICAL DATA:  Pain EXAM: RIGHT TIBIA AND FIBULA - 2 VIEW COMPARISON:  None. FINDINGS: There is an acute mildly displaced fracture involving the distal fibula with surrounding soft tissue swelling. There is no evidence for a fracture involving the proximal tibia or fibula. IMPRESSION: Acute mildly displaced fracture of the fibula. Electronically Signed   By: Katherine Mantle M.D.   On: 03/02/2019 05:24   Dg Ankle Complete Right  Result Date: 03/02/2019 CLINICAL DATA:  Rolled ankle.  Pain EXAM: RIGHT ANKLE - COMPLETE 3+ VIEW COMPARISON:  None. FINDINGS: There is an oblique fracture through the distal left fibular metaphysis. No tibial abnormality. No significant displacement. Ankle mortise is intact. IMPRESSION: Oblique nondisplaced distal  fibular metaphyseal fracture. Electronically Signed   By: Charlett Nose M.D.   On: 03/02/2019 03:49   Dg Foot 2 Views Right  Result Date: 03/02/2019 CLINICAL DATA:  Pain EXAM: RIGHT FOOT - 2 VIEW COMPARISON:  None. FINDINGS: There is an acute mildly displaced fracture involving the distal fibula with surrounding soft tissue swelling. There is no dislocation. IMPRESSION: Acute fracture of the distal fibula. Electronically Signed   By: Katherine Mantle M.D.   On: 03/02/2019 05:26    ____________________________________________   PROCEDURES  Procedure(s) performed (including Critical Care):  Procedures   ____________________________________________   INITIAL IMPRESSION / ASSESSMENT AND PLAN / ED COURSE  Ariel White was evaluated in Emergency Department on 03/02/2019 for the symptoms described in the history of present illness. She was evaluated in the context of the global COVID-19 pandemic, which necessitated consideration that the patient might be at risk for infection with the SARS-CoV-2 virus that causes COVID-19. Institutional protocols and algorithms that pertain to the evaluation of patients at risk for COVID-19 are in a state of rapid change based on information released by regulatory bodies including the CDC and federal and state organizations. These policies and algorithms were followed during the patient's care in the ED.    Patient presents with mechanical fall.  Will get x-rays to evaluate for fibula fracture, foot fracture, tibia fracture.  Did not hit her head to suggest epidural subdural hematoma.  Denies any other pain in her extremities or on her body.  Xray shows non displaced distal fibular metaphyseal fracture.   We will place patient in a posterior splint with stirrup.  Recommend nonweightbearing with crutches until she follows up with orthopedics.  Patient declined seeing the orthopedic on-call outpatient.  She says she is already followed by Dr. Joice Lofts and she would  prefer to follow-up with him.  She will call him tomorrow.  Patient says she has had some issues with balance in the past due to prior brain surgery.  Going to trial her on the crutches.   6:17 AM pt not able to ambulate with the crutches.  Patient lives alone.  This would not be a safe discharge home.  Will discuss with the orthopedic team.  6:29 AM d/w Dr. Allena Katz this is nonop.  Patient should be nonweightbearing and agree with the splint placed.   We will discuss with the hospital team for admission.   ____________________________________________   FINAL CLINICAL  IMPRESSION(S) / ED DIAGNOSES   Final diagnoses:  Closed fracture of distal end of right fibula, unspecified fracture morphology, initial encounter      MEDICATIONS GIVEN DURING THIS VISIT:  Medications  oxyCODONE-acetaminophen (PERCOCET/ROXICET) 5-325 MG per tablet 1 tablet (1 tablet Oral Given 03/02/19 0454)  ondansetron (ZOFRAN-ODT) disintegrating tablet 4 mg (4 mg Oral Given 03/02/19 0454)     ED Discharge Orders         Ordered    oxyCODONE (ROXICODONE) 5 MG immediate release tablet  Every 8 hours PRN     03/02/19 0526           Note:  This document was prepared using Dragon voice recognition software and may include unintentional dictation errors.   Concha SeFunke, Shrika Milos E, MD 03/02/19 [phone]

## 2019-03-03 DIAGNOSIS — F319 Bipolar disorder, unspecified: Secondary | ICD-10-CM

## 2019-03-03 DIAGNOSIS — R569 Unspecified convulsions: Secondary | ICD-10-CM | POA: Diagnosis not present

## 2019-03-03 DIAGNOSIS — S82831A Other fracture of upper and lower end of right fibula, initial encounter for closed fracture: Secondary | ICD-10-CM | POA: Diagnosis not present

## 2019-03-03 DIAGNOSIS — X509XXA Other and unspecified overexertion or strenuous movements or postures, initial encounter: Secondary | ICD-10-CM

## 2019-03-03 DIAGNOSIS — G894 Chronic pain syndrome: Secondary | ICD-10-CM | POA: Diagnosis not present

## 2019-03-03 LAB — BASIC METABOLIC PANEL
Anion gap: 10 (ref 5–15)
BUN: 17 mg/dL (ref 6–20)
CO2: 26 mmol/L (ref 22–32)
Calcium: 9.1 mg/dL (ref 8.9–10.3)
Chloride: 100 mmol/L (ref 98–111)
Creatinine, Ser: 0.68 mg/dL (ref 0.44–1.00)
GFR calc Af Amer: >60 mL/min (ref >60)
GFR calc non Af Amer: >60 mL/min (ref >60)
Glucose, Bld: 120 mg/dL — ABNORMAL HIGH (ref 70–99)
Potassium: 3.9 mmol/L (ref 3.5–5.1)
Sodium: 136 mmol/L (ref 135–145)

## 2019-03-03 LAB — CBC
HCT: 38 % (ref 36.0–46.0)
Hemoglobin: 12.6 g/dL (ref 12.0–15.0)
MCH: 35.6 pg — ABNORMAL HIGH (ref 26.0–34.0)
MCHC: 33.2 g/dL (ref 30.0–36.0)
MCV: 107.3 fL — ABNORMAL HIGH (ref 80.0–100.0)
Platelets: 295 10*3/uL (ref 150–400)
RBC: 3.54 MIL/uL — ABNORMAL LOW (ref 3.87–5.11)
RDW: 13.9 % (ref 11.5–15.5)
WBC: 5.8 10*3/uL (ref 4.0–10.5)
nRBC: 0 % (ref 0.0–0.2)

## 2019-03-03 MED ORDER — MORPHINE SULFATE (PF) 2 MG/ML IV SOLN: 2.0000 mg | Freq: Four times a day (QID) | INTRAVENOUS | Status: DC | PRN

## 2019-03-03 MED ORDER — OXYCODONE HCL 5 MG PO TABS: 5.0000 mg | ORAL_TABLET | Freq: Three times a day (TID) | ORAL | 0 refills | Status: AC | PRN

## 2019-03-03 NOTE — Progress Notes (Signed)
Physical Therapy Treatment Patient Details Name: Ariel White MRN: [id number] DOB: 02-16-1961 Today's Date: 03/03/2019    History of Present Illness Ariel White is a 81yoF who comes [facility] on 11/[facility] after rolling ankle in BR at home, found to have sustained fibular fracture. Pt was at [facility] last month after a fall in BR hitting her head. PMH including bipolar disorder, depression/anxiety, tobacco abuse, alcohol abuse, R shoulder hemiarthroplasty, and GERD.    PT Comments    Pt ready for session.  Was able to progress gait to 49' in hallway with RW and min guard/supervsion.  Cues to not hop so far into walker for safety.  Taken to rehab gym for stair training.  Pt has 3 or 4 steps into home.  She is fearful and unable to do in standing but was able to go up/down in seated position well.  Pt will have her sister and a friend assist with getting her into her home.  Discussed on/off floor once in home.  She does not feel like a wheelchair at home would be helpful and has problem solved meals etc.     Follow Up Recommendations  Home health PT;Supervision - Intermittent     Equipment Recommendations  Rolling walker with 5" wheels    Recommendations for Other Services       Precautions / Restrictions Precautions Precautions: Fall Restrictions Weight Bearing Restrictions: No    Mobility  Bed Mobility Overal bed mobility: Modified Independent                Transfers Overall transfer level: Modified independent Equipment used: Rolling walker (2 wheeled)                Ambulation/Gait Ambulation/Gait assistance: Supervision;Min guard Gait Distance (Feet): 70 Feet Assistive device: Rolling walker (2 wheeled)       General Gait Details: one legged hop-to gait   Stairs Stairs: Yes Stairs assistance: Min guard;Min assist Stair Management: Seated/boosting Number of Stairs: 4 General stair comments: unable to do in standing, but was able to complete in  sitting.   Wheelchair Mobility    Modified Rankin (Stroke Patients Only)       Balance Overall balance assessment: Modified Independent;History of Falls                                          Cognition Arousal/Alertness: Awake/alert Behavior During Therapy: WFL for tasks assessed/performed Overall Cognitive Status: Within Functional Limits for tasks assessed                                        Exercises      General Comments        Pertinent Vitals/Pain Pain Assessment: Faces Faces Pain Scale: Hurts whole lot Pain Location: Right foreleg Pain Descriptors / Indicators: Aching Pain Intervention(s): Limited activity within patient's tolerance;Patient requesting pain meds-RN notified    Home Living                      Prior Function            PT Goals (current goals can now be found in the care plan section) Progress towards PT goals: Progressing toward goals    Frequency    7X/week  PT Plan Current plan remains appropriate    Co-evaluation              AM-PAC PT "6 Clicks" Mobility   Outcome Measure  Help needed turning from your back to your side while in a flat bed without using bedrails?: None Help needed moving from lying on your back to sitting on the side of a flat bed without using bedrails?: None Help needed moving to and from a bed to a chair (including a wheelchair)?: None Help needed standing up from a chair using your arms (e.g., wheelchair or bedside chair)?: None Help needed to walk in hospital room?: A Little Help needed climbing 3-5 steps with a railing? : A Little 6 Click Score: 22    End of Session Equipment Utilized During Treatment: Gait belt Activity Tolerance: Patient tolerated treatment well Patient left: in bed;with call bell/phone within reach;Other (comment)         Time: 1035-1100 PT Time Calculation (min) (ACUTE ONLY): 25 min  Charges:  $Gait Training:  23-37 mins                    Danielle Dess, PTA 03/03/19, 11:13 AM

## 2019-03-03 NOTE — ED Notes (Signed)
Report given to floor RN

## 2019-03-03 NOTE — Care Management Obs Status (Signed)
[facility] NOTIFICATION   Patient Details  Name: Ariel White MRN: [id number] Date of Birth: [birth year]   Medicare Observation Status Notification Given:  Yes    Lilygrace Rodick A Kelen Laura, RN 03/03/2019, 10:40 AM

## 2019-03-03 NOTE — Discharge Summary (Signed)
[facility] [place] at [facility]   PATIENT NAME: Ariel White    MR#:  [id number]  DATE OF BIRTH:  01/25/1961  DATE OF ADMISSION:  03/02/2019 ADMITTING PHYSICIAN: Rolly SalterPranav M Clotilde Loth, MD  DATE OF DISCHARGE: 03/03/2019  PRIMARY CARE PHYSICIAN: Carren Rangarter, Danielle, PA-C    ADMISSION DIAGNOSIS:  Closed fracture of distal end of right fibula, unspecified fracture morphology, initial encounter [S82.831A]  DISCHARGE DIAGNOSIS:   Closed fracture of distal end of right fibula  SECONDARY DIAGNOSIS:   Past Medical History:  Diagnosis Date  . Anxiety   . Arthritis   . Bipolar 1 disorder (HCC)   . Chronic pain   . Depression   . Eczema   . Fibromyalgia   . GERD (gastroesophageal reflux disease)   . Headache    migraines  . History of hiatal hernia   . Neuromuscular disorder (HCC)   . Seizures [facility](HCC)     HOSPITAL COURSE:   Ariel White is a 58 y.o. female with medical history significant for chronic pain secondary to fibromyalgia, bipolar disorder, seizure disorder, and GERD.  She presented to the ER after mechanical fall.  She reports difficulty getting out of her bed to ambulate to the bathroom due to the height of the mattress.She dropped down to twisted her ankle and had significant pain.  She did not have any fall to the floor and did not hit her head or have any loss of consciousness.   #Closed right fibula fracture -Initial orthopedic recommendation was to discharge home given this was a nonoperative injury and to follow-up with their office otherwise to be nonweightbearing on RW per PT assessment -Patient unable to safely manage based on evaluation in ER, lives alone -PT evaluation appreciated. Recommends RW. HHPT to be arranged -prn pain meds -pt wants to f/u with Dr Joice Loftspoggi as out pt--she will make appt. She sees him for her left shoulder pain-- I will send dr Joice Loftspoggi message about her  # Chronic pain/fibromyalgia Continue preadmission Ultram  #  Bipolar 1  disorder (HCC) Continue preadmission Zyprexa   #GERD (gastroesophageal reflux disease) Continue preadmission Protonix  #Seizure disorder-chronic Continue preadmission Lamictal   Pt is ok to go home today. [facility] Pt has sister and friend who will help her at home.  CONSULTS OBTAINED:    DRUG ALLERGIES:   Allergies  Allergen Reactions  . Compazine [Prochlorperazine Edisylate] Other (See Comments)    euphoria  . Ivp Dye [Iodinated Diagnostic Agents] Anaphylaxis    "stopped her heart"  . Baclofen Other (See Comments)    dizziness  . Imitrex [Sumatriptan] Swelling    "throat swelling"  . Voltaren [Diclofenac] Other (See Comments)    Cannot hold onto objects (drops things)    DISCHARGE MEDICATIONS:   Allergies as of 03/03/2019      Reactions   Compazine [prochlorperazine Edisylate] Other (See Comments)   euphoria   Ivp Dye [iodinated Diagnostic Agents] Anaphylaxis   "stopped her heart"   Baclofen Other (See Comments)   dizziness   Imitrex [sumatriptan] Swelling   "throat swelling"   Voltaren [diclofenac] Other (See Comments)   Cannot hold onto objects (drops things)      Medication List    STOP taking these medications   ARTHRITIS STRENGTH BC POWDER PO   folic acid 1 MG tablet Commonly known as: FOLVITE   Gaviscon Extra Strength 160-105 MG Chew Generic drug: Alum Hydroxide-Mag Carbonate   thiamine 100 MG tablet Commonly known as: VITAMIN  B-1     TAKE these medications   cyanocobalamin 1000 MCG tablet Take 1 tablet (1,000 mcg total) by mouth daily.   diphenhydramine-acetaminophen 25-500 MG Tabs tablet Commonly known as: TYLENOL PM Take 2 tablets by mouth at bedtime as needed (sleep).   lamoTRIgine 25 MG tablet Commonly known as: LAMICTAL Take 100 mg by mouth 2 (two) times daily.   neomycin-bacitracin-polymyxin Oint Commonly known as: NEOSPORIN Apply 1 application topically 2 (two) times daily as needed for wound care.   OLANZapine  2.5 MG tablet Commonly known as: ZYPREXA Take 1 tablet (2.5 mg total) by mouth 2 (two) times daily.   oxyCODONE 5 MG immediate release tablet Commonly known as: Roxicodone Take 1 tablet (5 mg total) by mouth every 8 (eight) hours as needed for up to 7 days.   pantoprazole 40 MG tablet Commonly known as: PROTONIX Take 1 tablet (40 mg total) by mouth daily.   traMADol 50 MG tablet Commonly known as: ULTRAM Take 1 tablet (50 mg total) by mouth every 6 (six) hours as needed for moderate pain.   Tylenol Arthritis Pain 650 MG CR tablet Generic drug: acetaminophen Take 1,300 mg by mouth 2 (two) times daily.            Durable Medical Equipment  (From admission, onward)         Start     Ordered   03/03/19 1330  For home use only DME Walker rolling  Once    Question:  Patient needs a walker to treat with the following condition  Answer:  Closed fibular fracture   03/03/19 1333          If you experience worsening of your admission symptoms, develop shortness of breath, life threatening emergency, suicidal or homicidal thoughts you must seek medical attention immediately by calling 911 or calling your MD immediately  if symptoms less severe.  You Must read complete instructions/literature along with all the possible adverse reactions/side effects for all the Medicines you take and that have been prescribed to you. Take any new Medicines after you have completely understood and accept all the possible adverse reactions/side effects.   Please note  You were cared for by a hospitalist during your hospital stay. If you have any questions about your discharge medications or the care you received while you were in the hospital after you are discharged, you can call the unit and asked to speak with the hospitalist on call if the hospitalist that took care of you is not available. Once you are discharged, your primary care physician will handle any further medical issues. Please note that  NO REFILLS for any discharge medications will be authorized once you are discharged, as it is imperative that you return to your primary care physician (or establish a relationship with a primary care physician if you do not have one) for your aftercare needs so that they can reassess your need for medications and monitor your lab values. Today   SUBJECTIVE  Leg pain but overall better   VITAL SIGNS:  Blood pressure (!) 157/86, pulse 92, temperature 98.2 F (36.8 C), temperature source Oral, resp. rate 20, height  (1.6 m), weight 52.8 kg, SpO2 100 %.  I/O:    Intake/Output Summary (Last 24 hours) at 03/03/2019 1338 Last data filed at 03/03/2019 1109 Gross per 24 hour  Intake 493 ml  Output 350 ml  Net 143 ml    PHYSICAL EXAMINATION:  GENERAL:  58 y.o.-year-old patient lying in  the bed with no acute distress.  EYES: Pupils equal, round, reactive to light and accommodation. No scleral icterus. Extraocular muscles intact.  HEENT: Head atraumatic, normocephalic. Oropharynx and nasopharynx clear.  NECK:  Supple, no jugular venous distention. No thyroid enlargement, no tenderness.  LUNGS: Normal breath sounds bilaterally, no wheezing, rales,rhonchi or crepitation. No use of accessory muscles of respiration.  CARDIOVASCULAR: S1, S2 normal. No murmurs, rubs, or gallops.  ABDOMEN: Soft, non-tender, non-distended. Bowel sounds present. No organomegaly or mass.  EXTREMITIES: No pedal edema, cyanosis, or clubbing. Right foot cast+ NEUROLOGIC: Cranial nerves II through XII are intact. Muscle strength 5/5 in all extremities. Sensation intact. Gait not checked.  PSYCHIATRIC: The patient is alert and oriented x 3.  SKIN: No obvious rash, lesion, or ulcer.   DATA REVIEW:   CBC  Recent Labs  Lab 03/03/19 0609  WBC 5.8  HGB 12.6  HCT 38.0  PLT 295    Chemistries  Recent Labs  Lab 03/02/19 0812 03/03/19 0609  NA  --  136  K  --  3.9  CL  --  100  CO2  --  26  GLUCOSE  --  120*   BUN  --  17  CREATININE 0.81 0.68  CALCIUM  --  9.1  MG 1.7  --     Microbiology Results   Recent Results (from the past 240 hour(s))  SARS CORONAVIRUS 2 (TAT 6-24 HRS) Nasopharyngeal Nasopharyngeal Swab     Status: None   Collection Time: 03/02/19  7:43 AM   Specimen: Nasopharyngeal Swab  Result Value Ref Range Status   SARS Coronavirus 2 NEGATIVE NEGATIVE Final    Comment: (NOTE) SARS-CoV-2 target nucleic acids are NOT DETECTED. The SARS-CoV-2 RNA is generally detectable in upper and lower respiratory specimens during the acute phase of infection. Negative results do not preclude SARS-CoV-2 infection, do not rule out co-infections with other pathogens, and should not be used as the sole basis for treatment or other patient management decisions. Negative results must be combined with clinical observations, patient history, and epidemiological information. The expected result is Negative. Fact Sheet for Patients: HairSlick.no Fact Sheet for Healthcare Providers: quierodirigir.com This test is not yet approved or cleared by the [place] FDA and  has been authorized for detection and/or diagnosis of SARS-CoV-2 by FDA under an Emergency Use Authorization (EUA). This EUA will remain  in effect (meaning this test can be used) for the duration of the COVID-19 declaration under Section 56 4(b)(1) of the Act, 21 U.S.C. section 360bbb-3(b)(1), unless the authorization is terminated or revoked sooner. Performed at [facility], [address] [place], [place] [address]     RADIOLOGY:  Dg Tibia/fibula Right  Result Date: 03/02/2019 CLINICAL DATA:  Pain EXAM: RIGHT TIBIA AND FIBULA - 2 VIEW COMPARISON:  None. FINDINGS: There is an acute mildly displaced fracture involving the distal fibula with surrounding soft tissue swelling. There is no evidence for a fracture involving the proximal tibia or fibula. IMPRESSION:  Acute mildly displaced fracture of the fibula. Electronically Signed   By: Katherine Mantle M.D.   On: 03/02/2019 05:24   Dg Ankle Complete Right  Result Date: 03/02/2019 CLINICAL DATA:  Rolled ankle.  Pain EXAM: RIGHT ANKLE - COMPLETE 3+ VIEW COMPARISON:  None. FINDINGS: There is an oblique fracture through the distal left fibular metaphysis. No tibial abnormality. No significant displacement. Ankle mortise is intact. IMPRESSION: Oblique nondisplaced distal fibular metaphyseal fracture. Electronically Signed   By: Charlett Nose  M.D.   On: 03/02/2019 03:49   Dg Foot 2 Views Right  Result Date: 03/02/2019 CLINICAL DATA:  Pain EXAM: RIGHT FOOT - 2 VIEW COMPARISON:  None. FINDINGS: There is an acute mildly displaced fracture involving the distal fibula with surrounding soft tissue swelling. There is no dislocation. IMPRESSION: Acute fracture of the distal fibula. Electronically Signed   By: Constance Holster M.D.   On: 03/02/2019 05:26     CODE STATUS:     Code Status Orders  (From admission, onward)         Start     Ordered   03/02/19 0751  Full code  Continuous     03/02/19 0752        Code Status History    Date Active Date Inactive Code Status Order ID Comments User Context   01/14/2019 1640 01/22/2019 1924 Full Code [id number]  Mckinley Jewel, MD ED   11/14/2018 1117 11/15/2018 2154 Full Code [id number]  Poggi, Marshall Cork, MD Inpatient   Advance Care Planning Activity      TOTAL TIME TAKING CARE OF THIS PATIENT: *40* minutes.    Fritzi Mandes M.D on 03/03/2019 at 1:38 PM  Between 7am to 6pm - Pager - [phone] After 6pm go to [email or web address] - password [email or web address]  [facility]    CC: Primary care physician; Wayland Denis, PA-C

## 2019-03-03 NOTE — Progress Notes (Signed)
[name] to be D/C'd Home per MD order.  Discussed prescriptions and follow up appointments with the patient. Prescriptions given to patient, medication list explained in detail. Pt verbalized understanding.  Allergies as of 03/03/2019      Reactions   Compazine [prochlorperazine Edisylate] Other (See Comments)   euphoria   Ivp Dye [iodinated Diagnostic Agents] Anaphylaxis   "stopped her heart"   Baclofen Other (See Comments)   dizziness   Imitrex [sumatriptan] Swelling   "throat swelling"   Voltaren [diclofenac] Other (See Comments)   Cannot hold onto objects (drops things)      Medication List    STOP taking these medications   ARTHRITIS STRENGTH BC POWDER PO   folic acid 1 MG tablet Commonly known as: FOLVITE   Gaviscon Extra Strength 160-105 MG Chew Generic drug: Alum Hydroxide-Mag Carbonate   thiamine 100 MG tablet Commonly known as: VITAMIN B-1     TAKE these medications   cyanocobalamin 1000 MCG tablet Take 1 tablet (1,000 mcg total) by mouth daily.   diphenhydramine-acetaminophen 25-500 MG Tabs tablet Commonly known as: TYLENOL PM Take 2 tablets by mouth at bedtime as needed (sleep).   lamoTRIgine 25 MG tablet Commonly known as: LAMICTAL Take 100 mg by mouth 2 (two) times daily.   neomycin-bacitracin-polymyxin Oint Commonly known as: NEOSPORIN Apply 1 application topically 2 (two) times daily as needed for wound care.   OLANZapine 2.5 MG tablet Commonly known as: ZYPREXA Take 1 tablet (2.5 mg total) by mouth 2 (two) times daily.   oxyCODONE 5 MG immediate release tablet Commonly known as: Roxicodone Take 1 tablet (5 mg total) by mouth every 8 (eight) hours as needed for up to 7 days.   pantoprazole 40 MG tablet Commonly known as: PROTONIX Take 1 tablet (40 mg total) by mouth daily.   traMADol 50 MG tablet Commonly known as: ULTRAM Take 1 tablet (50 mg total) by mouth every 6 (six) hours as needed for moderate pain.   Tylenol Arthritis Pain  650 MG CR tablet Generic drug: acetaminophen Take 1,300 mg by mouth 2 (two) times daily.            Durable Medical Equipment  (From admission, onward)         Start     Ordered   03/03/19 1330  For home use only DME Walker rolling  Once    Question:  Patient needs a walker to treat with the following condition  Answer:  Closed fibular fracture   03/03/19 1333          Vitals:   03/03/19 0022 03/03/19 0024  BP: (!) 157/86   Pulse: 92   Resp: 20   Temp:  98.2 F (36.8 C)  SpO2: 100%     Skin clean, dry and intact without evidence of skin break down, no evidence of skin tears noted. IV catheter discontinued intact. Site without signs and symptoms of complications. Dressing and pressure applied. Pt denies pain at this time. No complaints noted.  An After Visit Summary was printed and given to the patient. Patient escorted via [place], and D/C home via private auto.  Marry Guan

## 2019-03-03 NOTE — Discharge Instructions (Signed)
Take Tylenol 1 g every 8 hours easy oxycodone for breakthrough pain.  Do not drink or drive while using this medication.  Follow-up with orthopedic doctor for further management.  You should use rolling walker and be nonweightbearing until follow-up.  Oblique nondisplaced distal fibular metaphyseal fracture.

## 2019-09-11 ENCOUNTER — Emergency Department
Admission: EM | Admit: 2019-09-11 | Discharge: 2019-09-11 | Disposition: A | Discharge status: Home / Self Care | Payer: Medicare Other | Attending: Emergency Medicine | Admitting: Emergency Medicine

## 2019-09-11 ENCOUNTER — Encounter: Payer: Self-pay | Admitting: Emergency Medicine

## 2019-09-11 ENCOUNTER — Other Ambulatory Visit: Payer: Self-pay

## 2019-09-11 DIAGNOSIS — Z5321 Procedure and treatment not carried out due to patient leaving prior to being seen by health care provider: Secondary | ICD-10-CM | POA: Insufficient documentation

## 2019-09-11 DIAGNOSIS — Z760 Encounter for issue of repeat prescription: Secondary | ICD-10-CM | POA: Insufficient documentation

## 2019-09-11 NOTE — ED Triage Notes (Signed)
Pt to triage via w/c with no distress noted, mask in place; Pt reports out of lamictal and olanzaprine x 2wks and her PCP will not call in a rx unless she sees a psychiatrist; denies SI or HI or other accomp symptoms, st just wants meds refilled

## 2019-09-11 NOTE — ED Notes (Addendum)
Pt to STAT desk and states she is not wanting to wait and will follow up with her pcp to get her med refill

## 2020-02-22 ENCOUNTER — Emergency Department: Payer: Medicare Other

## 2020-02-22 ENCOUNTER — Other Ambulatory Visit: Payer: Self-pay

## 2020-02-22 ENCOUNTER — Emergency Department
Admission: EM | Admit: 2020-02-22 | Discharge: 2020-02-22 | Disposition: A | Discharge status: Home / Self Care | Payer: Medicare Other | Attending: Emergency Medicine | Admitting: Emergency Medicine

## 2020-02-22 DIAGNOSIS — S99912A Unspecified injury of left ankle, initial encounter: Secondary | ICD-10-CM | POA: Diagnosis present

## 2020-02-22 DIAGNOSIS — S82892A Other fracture of left lower leg, initial encounter for closed fracture: Secondary | ICD-10-CM

## 2020-02-22 DIAGNOSIS — F17210 Nicotine dependence, cigarettes, uncomplicated: Secondary | ICD-10-CM | POA: Insufficient documentation

## 2020-02-22 DIAGNOSIS — S92512A Displaced fracture of proximal phalanx of left lesser toe(s), initial encounter for closed fracture: Secondary | ICD-10-CM | POA: Insufficient documentation

## 2020-02-22 DIAGNOSIS — S93402A Sprain of unspecified ligament of left ankle, initial encounter: Secondary | ICD-10-CM

## 2020-02-22 DIAGNOSIS — W010XXA Fall on same level from slipping, tripping and stumbling without subsequent striking against object, initial encounter: Secondary | ICD-10-CM | POA: Insufficient documentation

## 2020-02-22 MED ORDER — OXYCODONE-ACETAMINOPHEN 5-325 MG PO TABS
1.0000 | ORAL_TABLET | Freq: Once | ORAL | Status: AC
  Administered 2020-02-22: 1 via ORAL
  Filled 2020-02-22: qty 1

## 2020-02-22 MED ORDER — TRAMADOL HCL 50 MG PO TABS: 50.0000 mg | ORAL_TABLET | Freq: Four times a day (QID) | ORAL | 0 refills | Status: AC | PRN

## 2020-02-22 NOTE — ED Triage Notes (Addendum)
Pt here via POV from home. Pt here with c/o left foot pain after falling last night. Pt states she was plugging up her phone when she lost her balance and fell, states she felt and heard a 'pop'. Denies hitting head. 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

## 2020-02-22 NOTE — ED Notes (Signed)
Pt c/o left ankle pain, see triage note. Left ankle is bruised and slightly swollen.

## 2020-02-22 NOTE — ED Provider Notes (Signed)
[facility] Emergency Department Provider Note  ____________________________________________   First MD Initiated Contact with Patient 02/22/20 1609     (approximate)  I have reviewed the triage vital signs and the nursing notes.   HISTORY  Chief Complaint Left foot pain  HPI Ariel White is a 59 y.o. female who presents to the emergency department for evaluation of left foot and ankle pain.  Patient states that late last night into this morning she bent over get something off of the floor and she lost her balance.  She has a history of balance problems ever since she had brain surgery in the 80s for a brain tumor.  This is her normal balance problems.  She states that after this injury occurred, she developed swelling and bruising over the lateral aspect of the left ankle.  She has a history of osteoporosis and had a foot fracture within the last year on the contralateral side.  She states her pain is worse with range of motion and weightbearing, improved with rest.  She currently rates her pain a 10/10 located on the lateral aspect of the left foot and ankle.         Past Medical History:  Diagnosis Date  . Anxiety   . Arthritis   . Bipolar 1 disorder (HCC)   . Chronic pain   . Depression   . Eczema   . Fibromyalgia   . GERD (gastroesophageal reflux disease)   . Headache    migraines  . History of hiatal hernia   . Neuromuscular disorder (HCC)   . Seizures [facility](HCC)     Patient Active Problem List   Diagnosis Date Noted  . Fibula fracture 03/02/2019  . Closed right fibular fracture 03/02/2019  . Bipolar 1 disorder (HCC)   . GERD (gastroesophageal reflux disease)   . Seizures (HCC)   . Syncope 01/15/2019  . Altered mental state 01/14/2019  . Tobacco abuse 01/14/2019  . Anxiety 01/14/2019  . Rhabdomyolysis 01/14/2019  . Macrocytosis 01/14/2019  . Status post fall 01/14/2019  . Fall   . 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) 11/14/2018   . Fibromyalgia 09/17/2015  . Myofascial pain syndrome 08/29/2015  . DDD (degenerative disc disease), cervical 08/29/2015  . Pain in joint, shoulder region 08/29/2015  . Chronic pain 08/29/2015  . Depression, unspecified 02/01/2015  . Alcohol use disorder, mild, abuse 02/01/2015  . 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 ABDOMINAL HYSTERECTOMY    . APPENDECTOMY    . BRAIN TUMOR EXCISION  1985  . SHOULDER HEMI-ARTHROPLASTY Right 11/14/2018   Procedure: SHOULDER HEMI-ARTHROPLASTY;  Surgeon: Christena FlakePoggi, John J, MD;  Location: [facility] ORS;  Service: Orthopedics;  Laterality: Right;  . TUBAL LIGATION      Prior to Admission medications   Medication Sig Start Date End Date Taking? Authorizing Provider  acetaminophen (TYLENOL ARTHRITIS PAIN) 650 MG CR tablet Take 1,300 mg by mouth 2 (two) times daily.     [provider]  diphenhydramine-acetaminophen (TYLENOL PM) 25-500 MG TABS tablet Take 2 tablets by mouth at bedtime as needed (sleep).     [provider]  lamoTRIgine (LAMICTAL) 25 MG tablet Take 100 mg by mouth 2 (two) times daily.     [provider]  neomycin-bacitracin-polymyxin (NEOSPORIN) OINT Apply 1 application topically 2 (two) times daily as needed for wound care.    [provider]  OLANZapine [facility](ZYPREXA)  2.5 MG tablet Take 1 tablet (2.5 mg total) by mouth 2 (two) times daily. 01/22/19   Burnadette Pop, MD  pantoprazole (PROTONIX) 40 MG tablet Take 1 tablet (40 mg total) by mouth daily. 01/23/19   Burnadette Pop, MD  traMADol (ULTRAM) 50 MG tablet Take 1 tablet (50 mg total) by mouth every 6 (six) hours as needed for up to 5 days. 02/22/20 02/27/20  Lucy Chris, PA  vitamin B-12 1000 MCG tablet Take 1 tablet (1,000 mcg total) by mouth daily. Patient not taking: Reported on 03/02/2019 01/23/19   Burnadette Pop, MD    Allergies Compazine [prochlorperazine  edisylate], Ivp dye [iodinated diagnostic agents], Baclofen, Imitrex [sumatriptan], and Voltaren [diclofenac]  Family History  Problem Relation Age of Onset  . Breast cancer Mother        85's    Social History Social History   Tobacco Use  . Smoking status: Current Every Day Smoker    Packs/day: 0.25    Types: Cigarettes  . Smokeless tobacco: Never Used  Vaping Use  . Vaping Use: Never used  Substance Use Topics  . Alcohol use: Yes    Alcohol/week: 2.0 standard drinks    Types: 2 Cans of beer per week    Comment: once per month  . Drug use: Never    Comment: 3 TO 5 YEARS    Review of Systems Constitutional: No fever/chills Eyes: No visual changes. ENT: No sore throat. Cardiovascular: Denies chest pain. Respiratory: Denies shortness of breath. Gastrointestinal: No abdominal pain.  No nausea, no vomiting.  No diarrhea.  No constipation. Genitourinary: Negative for dysuria. Musculoskeletal: + Left foot and ankle pain, negative for back pain. Skin: Negative for rash. Neurological: Negative for headaches, focal weakness or numbness.   ____________________________________________   PHYSICAL EXAM:  VITAL SIGNS: ED Triage Vitals [02/22/20 1454]  Enc Vitals Group     BP (!) 174/105     Pulse Rate (!) 101     Resp 18     Temp 98.6 F (37 C)     Temp Source Oral     SpO2 96 %     Weight 118 lb (53.5 kg)     Height 5\' 3"  (1.6 m)     Head Circumference      Peak Flow      Pain Score 10     Pain Loc      Pain Edu?      Excl. in GC?     Constitutional: Alert and oriented. Well appearing and in no acute distress. Eyes: Conjunctivae are normal.  Head: Atraumatic. Nose: No congestion/rhinnorhea. Mouth/Throat: Mucous membranes are moist.  Neck: No stridor.   Cardiovascular: Normal rate, regular rhythm.   Good peripheral circulation. Respiratory: Normal respiratory effort.  No retractions. Lungs CTAB. Musculoskeletal: There is swelling and ecchymosis present  about the lateral aspect of the left foot and ankle.  Ecchymosis extends from the distal metatarsals to approximately 2 inches superior to the lateral malleolus.  Patient is diffusely tender to touch over the lateral aspects of the foot and ankle with no individual area being more painful than another.  The patient's range of motion is limited secondary to pain.  Appropriate capillary refill and pulses identified as 2+. Neurologic:  Normal speech and language. No gross focal neurologic deficits are appreciated. No gait instability. Skin:  Skin is warm, dry and intact. No rash noted. Psychiatric: Mood and affect are normal. Speech and behavior are normal.  ____________________________________________  RADIOLOGY I,  Lucy Chris, personally viewed and evaluated these images (plain radiographs) as part of my medical decision making, as well as reviewing the written report by the radiologist.  Official radiology report(s): DG Ankle Complete Left  Result Date: 02/22/2020 CLINICAL DATA:  Ankle and foot injury EXAM: LEFT ANKLE COMPLETE - 3+ VIEW COMPARISON:  None. FINDINGS: Ankle mortise is symmetric. Acute cortical avulsion off the dorsal navicular bone. Possible acute avulsion off the dorsal distal talus bone. IMPRESSION: Acute cortical avulsion fractures off the dorsal navicular bone and possible avulsion off the dorsal distal talus bone. Electronically Signed   By: Jasmine Pang M.D.   On: 02/22/2020 16:53   DG Foot Complete Left  Result Date: 02/22/2020 CLINICAL DATA:  Foot and ankle pain after fall EXAM: LEFT FOOT - COMPLETE 3+ VIEW COMPARISON:  01/14/2019 FINDINGS: Acute appearing cortical avulsion off the dorsal navicular. Possible cortical avulsion off the distal dorsal talus. No other definitive fracture is visualized. The joint spaces are maintained. IMPRESSION: Acute cortical avulsion off the dorsal navicular bone. Possible cortical avulsion off the dorsal aspect of the distal talus.  Electronically Signed   By: Jasmine Pang M.D.   On: 02/22/2020 16:52    ____________________________________________   INITIAL IMPRESSION / ASSESSMENT AND PLAN / ED COURSE  As part of my medical decision making, I reviewed the following data within the electronic MEDICAL RECORD NUMBER Nursing notes reviewed and incorporated, Radiograph reviewed and [place] Controlled Substance Database reviewed        Patient is a 59 year old female who presents emergency department for acute left foot and ankle pain that began just after a fall late in the evening yesterday.  There is swelling and ecchymosis present about it with decreased range of motion and tenderness to palpation.  X-rays identify 1, possibly 2 avulsion fractures in the lateral aspect of the foot and ankle.  Given the patient's difficulty with weightbearing as well as ecchymosis, will place the patient in a short cam walking boot.  She should follow-up with podiatry early next week for repeat evaluation.  Patient was given pain medication.  Return precautions discussed with the patient should she develop any worsening pain, swelling into the calf or redness of the calf.  The patient is amenable with outpatient therapy at this time and is stable for discharge.      ____________________________________________   FINAL CLINICAL IMPRESSION(S) / ED DIAGNOSES  Final diagnoses:  Sprain of left ankle, unspecified ligament, initial encounter  Avulsion fracture of ankle, left, closed, initial encounter     ED Discharge Orders         Ordered    traMADol (ULTRAM) 50 MG tablet  Every 6 hours PRN        02/22/20 1716          *Please note:  DELMA DRONE was evaluated in Emergency Department on 02/22/2020 for the symptoms described in the history of present illness. She was evaluated in the context of the global COVID-19 pandemic, which necessitated consideration that the patient might be at risk for infection with the SARS-CoV-2 virus that  causes COVID-19. Institutional protocols and algorithms that pertain to the evaluation of patients at risk for COVID-19 are in a state of rapid change based on information released by regulatory bodies including the CDC and federal and state organizations. These policies and algorithms were followed during the patient's care in the ED.  Some ED evaluations and interventions may be delayed as a result of limited staffing during and  the pandemic.*   Note:  This document was prepared using Dragon voice recognition software and may include unintentional dictation errors.    Lucy Chris, PA 02/22/20 2212    Gilles Chiquito, MD 02/23/20 [phone]

## 2020-10-29 ENCOUNTER — Emergency Department: Payer: Medicare Other

## 2020-10-29 ENCOUNTER — Other Ambulatory Visit: Payer: Self-pay

## 2020-10-29 ENCOUNTER — Inpatient Hospital Stay
Admission: EM | Admit: 2020-10-29 | Discharge: 2020-11-06 | DRG: 897 | Disposition: A | Discharge status: Home Health | Payer: Medicare Other | Attending: Internal Medicine | Admitting: Internal Medicine

## 2020-10-29 DIAGNOSIS — F10139 Alcohol abuse with withdrawal, unspecified: Secondary | ICD-10-CM | POA: Diagnosis not present

## 2020-10-29 DIAGNOSIS — K7460 Unspecified cirrhosis of liver: Secondary | ICD-10-CM | POA: Diagnosis present

## 2020-10-29 DIAGNOSIS — Z803 Family history of malignant neoplasm of breast: Secondary | ICD-10-CM

## 2020-10-29 DIAGNOSIS — Y92023 Bedroom in mobile home as the place of occurrence of the external cause: Secondary | ICD-10-CM

## 2020-10-29 DIAGNOSIS — M1990 Unspecified osteoarthritis, unspecified site: Secondary | ICD-10-CM | POA: Diagnosis present

## 2020-10-29 DIAGNOSIS — S42411A Displaced simple supracondylar fracture without intercondylar fracture of right humerus, initial encounter for closed fracture: Secondary | ICD-10-CM | POA: Diagnosis present

## 2020-10-29 DIAGNOSIS — W19XXXA Unspecified fall, initial encounter: Secondary | ICD-10-CM

## 2020-10-29 DIAGNOSIS — Z888 Allergy status to other drugs, medicaments and biological substances status: Secondary | ICD-10-CM

## 2020-10-29 DIAGNOSIS — L309 Dermatitis, unspecified: Secondary | ICD-10-CM | POA: Diagnosis present

## 2020-10-29 DIAGNOSIS — G894 Chronic pain syndrome: Secondary | ICD-10-CM | POA: Diagnosis present

## 2020-10-29 DIAGNOSIS — T148XXA Other injury of unspecified body region, initial encounter: Secondary | ICD-10-CM

## 2020-10-29 DIAGNOSIS — Z8603 Personal history of neoplasm of uncertain behavior: Secondary | ICD-10-CM

## 2020-10-29 DIAGNOSIS — S72464A Nondisplaced supracondylar fracture with intracondylar extension of lower end of right femur, initial encounter for closed fracture: Secondary | ICD-10-CM

## 2020-10-29 DIAGNOSIS — Z90710 Acquired absence of both cervix and uterus: Secondary | ICD-10-CM

## 2020-10-29 DIAGNOSIS — Z20822 Contact with and (suspected) exposure to covid-19: Secondary | ICD-10-CM | POA: Diagnosis present

## 2020-10-29 DIAGNOSIS — F10230 Alcohol dependence with withdrawal, uncomplicated: Secondary | ICD-10-CM | POA: Diagnosis not present

## 2020-10-29 DIAGNOSIS — R296 Repeated falls: Secondary | ICD-10-CM

## 2020-10-29 DIAGNOSIS — G40909 Epilepsy, unspecified, not intractable, without status epilepticus: Secondary | ICD-10-CM | POA: Diagnosis present

## 2020-10-29 DIAGNOSIS — Z79899 Other long term (current) drug therapy: Secondary | ICD-10-CM

## 2020-10-29 DIAGNOSIS — M797 Fibromyalgia: Secondary | ICD-10-CM | POA: Diagnosis present

## 2020-10-29 DIAGNOSIS — F319 Bipolar disorder, unspecified: Secondary | ICD-10-CM | POA: Diagnosis present

## 2020-10-29 DIAGNOSIS — Z66 Do not resuscitate: Secondary | ICD-10-CM | POA: Diagnosis present

## 2020-10-29 DIAGNOSIS — F17210 Nicotine dependence, cigarettes, uncomplicated: Secondary | ICD-10-CM | POA: Diagnosis present

## 2020-10-29 DIAGNOSIS — K219 Gastro-esophageal reflux disease without esophagitis: Secondary | ICD-10-CM | POA: Diagnosis present

## 2020-10-29 DIAGNOSIS — F419 Anxiety disorder, unspecified: Secondary | ICD-10-CM | POA: Diagnosis present

## 2020-10-29 DIAGNOSIS — F10930 Alcohol use, unspecified with withdrawal, uncomplicated: Secondary | ICD-10-CM

## 2020-10-29 LAB — ETHANOL: Alcohol, Ethyl (B): 76 mg/dL — ABNORMAL HIGH (ref <10)

## 2020-10-29 LAB — AMMONIA: Ammonia: 19 umol/L (ref 9–35)

## 2020-10-29 LAB — CBC
HCT: 43.5 % (ref 36.0–46.0)
Hemoglobin: 15 g/dL (ref 12.0–15.0)
MCH: 39.8 pg — ABNORMAL HIGH (ref 26.0–34.0)
MCHC: 34.5 g/dL (ref 30.0–36.0)
MCV: 115.4 fL — ABNORMAL HIGH (ref 80.0–100.0)
Platelets: 248 10*3/uL (ref 150–400)
RBC: 3.77 MIL/uL — ABNORMAL LOW (ref 3.87–5.11)
RDW: 13.6 % (ref 11.5–15.5)
WBC: 9.7 10*3/uL (ref 4.0–10.5)
nRBC: 0 % (ref 0.0–0.2)

## 2020-10-29 LAB — HEPATIC FUNCTION PANEL
ALT: 20 U/L (ref 0–44)
AST: 42 U/L — ABNORMAL HIGH (ref 15–41)
Albumin: 4 g/dL (ref 3.5–5.0)
Alkaline Phosphatase: 123 U/L (ref 38–126)
Bilirubin, Direct: <0.1 mg/dL (ref 0.0–0.2)
Total Bilirubin: 0.6 mg/dL (ref 0.3–1.2)
Total Protein: 7.8 g/dL (ref 6.5–8.1)

## 2020-10-29 LAB — BASIC METABOLIC PANEL
Anion gap: 15 (ref 5–15)
BUN: 21 mg/dL — ABNORMAL HIGH (ref 6–20)
CO2: 15 mmol/L — ABNORMAL LOW (ref 22–32)
Calcium: 8.8 mg/dL — ABNORMAL LOW (ref 8.9–10.3)
Chloride: 111 mmol/L (ref 98–111)
Creatinine, Ser: 0.78 mg/dL (ref 0.44–1.00)
GFR, Estimated: >60 mL/min (ref >60)
Glucose, Bld: 88 mg/dL (ref 70–99)
Potassium: 3.8 mmol/L (ref 3.5–5.1)
Sodium: 141 mmol/L (ref 135–145)

## 2020-10-29 LAB — PROTIME-INR
INR: 0.9 (ref 0.8–1.2)
Prothrombin Time: 12.6 seconds (ref 11.4–15.2)

## 2020-10-29 LAB — LACTIC ACID, PLASMA
Lactic Acid, Venous: 2.5 mmol/L (ref 0.5–1.9)
Lactic Acid, Venous: 1.5 mmol/L (ref 0.5–1.9)

## 2020-10-29 LAB — TROPONIN I (HIGH SENSITIVITY)
Troponin I (High Sensitivity): 6 ng/L (ref <18)
Troponin I (High Sensitivity): 7 ng/L (ref <18)

## 2020-10-29 LAB — LIPASE, BLOOD: Lipase: 46 U/L (ref 11–51)

## 2020-10-29 MED ORDER — LORAZEPAM 1 MG PO TABS: 1.0000 mg | ORAL_TABLET | ORAL | Status: AC | PRN

## 2020-10-29 MED ORDER — THIAMINE HCL 100 MG PO TABS
100.0000 mg | ORAL_TABLET | Freq: Every day | ORAL | Status: DC
  Administered 2020-10-30 – 2020-11-06 (×7): 100 mg via ORAL
  Filled 2020-10-29 (×7): qty 1

## 2020-10-29 MED ORDER — OXYCODONE HCL 5 MG PO TABS
5.0000 mg | ORAL_TABLET | ORAL | Status: DC | PRN
  Administered 2020-10-30 – 2020-11-04 (×24): 5 mg via ORAL
  Filled 2020-10-29 (×26): qty 1

## 2020-10-29 MED ORDER — THIAMINE HCL 100 MG/ML IJ SOLN
100.0000 mg | Freq: Every day | INTRAMUSCULAR | Status: DC
  Administered 2020-10-31: 100 mg via INTRAVENOUS
  Filled 2020-10-29: qty 2

## 2020-10-29 MED ORDER — MORPHINE SULFATE (PF) 4 MG/ML IV SOLN
INTRAVENOUS | Status: AC
  Administered 2020-10-29: 4 mg
  Filled 2020-10-29: qty 1

## 2020-10-29 MED ORDER — SODIUM CHLORIDE 0.9% FLUSH
3.0000 mL | Freq: Two times a day (BID) | INTRAVENOUS | Status: DC
  Administered 2020-10-30 – 2020-11-06 (×15): 3 mL via INTRAVENOUS

## 2020-10-29 MED ORDER — SENNOSIDES-DOCUSATE SODIUM 8.6-50 MG PO TABS: 1.0000 | ORAL_TABLET | Freq: Every evening | ORAL | Status: DC | PRN

## 2020-10-29 MED ORDER — ACETAMINOPHEN 325 MG PO TABS
650.0000 mg | ORAL_TABLET | Freq: Four times a day (QID) | ORAL | Status: DC | PRN
  Administered 2020-11-02 – 2020-11-04 (×4): 650 mg via ORAL
  Filled 2020-10-29 (×4): qty 2

## 2020-10-29 MED ORDER — LORAZEPAM 2 MG/ML IJ SOLN
1.0000 mg | Freq: Once | INTRAMUSCULAR | Status: AC
  Administered 2020-10-29: 1 mg via INTRAVENOUS
  Filled 2020-10-29: qty 1

## 2020-10-29 MED ORDER — ONDANSETRON HCL 4 MG/2ML IJ SOLN: 4.0000 mg | Freq: Four times a day (QID) | INTRAMUSCULAR | Status: DC | PRN

## 2020-10-29 MED ORDER — ENOXAPARIN SODIUM 40 MG/0.4ML IJ SOSY
40.0000 mg | PREFILLED_SYRINGE | INTRAMUSCULAR | Status: DC
  Administered 2020-10-30 – 2020-11-06 (×8): 40 mg via SUBCUTANEOUS
  Filled 2020-10-29 (×8): qty 0.4

## 2020-10-29 MED ORDER — LACTATED RINGERS IV BOLUS
1000.0000 mL | Freq: Once | INTRAVENOUS | Status: AC
  Administered 2020-10-29: 1000 mL via INTRAVENOUS

## 2020-10-29 MED ORDER — LACTATED RINGERS IV SOLN: INTRAVENOUS | Status: AC

## 2020-10-29 MED ORDER — ONDANSETRON HCL 4 MG PO TABS: 4.0000 mg | ORAL_TABLET | Freq: Four times a day (QID) | ORAL | Status: DC | PRN

## 2020-10-29 MED ORDER — NICOTINE 14 MG/24HR TD PT24
14.0000 mg | MEDICATED_PATCH | Freq: Every day | TRANSDERMAL | Status: DC
  Administered 2020-10-30 – 2020-11-06 (×9): 14 mg via TRANSDERMAL
  Filled 2020-10-29 (×9): qty 1

## 2020-10-29 MED ORDER — MORPHINE SULFATE (PF) 4 MG/ML IV SOLN
4.0000 mg | Freq: Once | INTRAVENOUS | Status: AC
  Administered 2020-10-29: 4 mg via INTRAVENOUS
  Filled 2020-10-29: qty 1

## 2020-10-29 MED ORDER — MORPHINE SULFATE (PF) 4 MG/ML IV SOLN
4.0000 mg | Freq: Once | INTRAVENOUS | Status: AC
  Filled 2020-10-29: qty 1

## 2020-10-29 MED ORDER — LORAZEPAM 2 MG/ML IJ SOLN
1.0000 mg | INTRAMUSCULAR | Status: AC | PRN
  Administered 2020-10-30 (×2): 2 mg via INTRAVENOUS
  Administered 2020-10-30: 4 mg via INTRAVENOUS
  Administered 2020-10-31: 2 mg via INTRAVENOUS
  Administered 2020-10-31: 4 mg via INTRAVENOUS
  Administered 2020-10-31: 2 mg via INTRAVENOUS
  Administered 2020-10-31 (×2): 4 mg via INTRAVENOUS
  Filled 2020-10-29: qty 1
  Filled 2020-10-29 (×3): qty 2
  Filled 2020-10-29 (×2): qty 1
  Filled 2020-10-29: qty 2
  Filled 2020-10-29: qty 1

## 2020-10-29 MED ORDER — ADULT MULTIVITAMIN W/MINERALS CH
1.0000 | ORAL_TABLET | Freq: Every day | ORAL | Status: DC
  Administered 2020-10-30 – 2020-11-06 (×8): 1 via ORAL
  Filled 2020-10-29 (×8): qty 1

## 2020-10-29 MED ORDER — ACETAMINOPHEN 650 MG RE SUPP: 650.0000 mg | Freq: Four times a day (QID) | RECTAL | Status: DC | PRN

## 2020-10-29 MED ORDER — FOLIC ACID 1 MG PO TABS
1.0000 mg | ORAL_TABLET | Freq: Every day | ORAL | Status: DC
  Administered 2020-10-30 – 2020-11-06 (×8): 1 mg via ORAL
  Filled 2020-10-29 (×8): qty 1

## 2020-10-29 NOTE — H&P (Signed)
History and Physical    Ariel White:[id number] DOB: 12-02-1960 DOA: 10/29/2020  PCP: Wayland Denis, PA-C  Patient coming from: Home via EMS  I have personally briefly reviewed patient's old medical records in [place]  Chief Complaint: Right elbow pain after fall 2 days ago  HPI: Ariel White is a 60 y.o. 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  Patient reports chronic balance issues resulting in frequent falls which has been an ongoing issue since she had a brain tumor excision in 1985.  She says night of 10/27/2020 she was getting up to go use the restroom when she lost her balance and fell onto the ground.  She hit her right elbow.  She said she did hit part of her head but did not lose consciousness.  She said earlier today 7/6 she was having worsening pain and swelling at her right elbow therefore came to the ED for further evaluation.    She reports occasional shortness of breath which is unchanged from her baseline which she attributes to her chronic tobacco use.  She denies any chest pain, abdominal pain, dysuria, or diarrhea.  She reports drinking liquor on the weekends.  She gives inconsistent history regarding her alcohol use and told the ED provider her last drink was on Saturday however when notified her alcohol level is elevated she did admit to drinking earlier today before she came to the ED. She she does report new tremors in her upper extremities today and has had some nausea without emesis earlier today as well as diaphoresis.  ED Course:  Initial vitals show BP 134/102, pulse 117, RR 18, temp 100.2 F, SPO2 94% on room air.  Labs show WBC 9.7, hemoglobin 15.0, platelets 248,000, sodium 141,  potassium 3.8, bicarb 15, BUN 21, creatinine 0.78, serum glucose 88, AST 42, ALT 20, alk phos 123, total bilirubin 0.6, lipase 46, serum ethanol 76, lactic acid 2.5 > 1.5, ammonia 19.  High-sensitivity troponin 6 > 7.  Right elbow x-ray showed an acute mildly displaced supracondylar humerus fracture superimposed on underlying chronic fracture deformity with associated elbow effusion.  Portable chest x-ray showed chronic hyperinflation and bronchial thickening without focal consolidation, edema, or effusion.  CT head and cervical spine without contrast negative for acute intracranial or cervical changes.  Postoperative changes in the posterior occipital bone with occipital craniotomy and Paca material noted.  CT of the right elbow shows acute supracondylar fracture involving the distal humerus with fracture fragments in near anatomic alignment.  Remote distal diaphyseal fracture of the distal humerus also noted.  Patient was given IV Ativan 1 mg, IV morphine 4 mg, and 1 L LR.  EDP discussed with on-call orthopedics, Dr. Roland Rack, who stated nonemergent surgical intervention in 7-10 days is recommended.  Patient was placed in right elbow splint.  The hospitalist service was consulted to admit for management of signs of alcohol withdrawal.  Review of Systems: All systems reviewed and are negative except as documented in history of present illness above.   Past Medical History:  Diagnosis Date   Anxiety    Arthritis    Bipolar 1 disorder (HCC)    Chronic pain    Depression    Eczema    Fibromyalgia    GERD (gastroesophageal reflux  disease)    Headache    migraines   History of hiatal hernia    Neuromuscular disorder ([place])    Seizures ([place])     Past Surgical History:  Procedure Laterality Date   ABDOMINAL HYSTERECTOMY     APPENDECTOMY     BRAIN TUMOR EXCISION  1985   SHOULDER HEMI-ARTHROPLASTY Right 11/14/2018   Procedure: SHOULDER HEMI-ARTHROPLASTY;  Surgeon: Corky Mull, MD;   Location: [facility] ORS;  Service: Orthopedics;  Laterality: Right;   TUBAL LIGATION      Social History:  reports that she has been smoking cigarettes. She has been smoking an average of 0.25 packs per day. She has never used smokeless tobacco. She reports current alcohol use of about 2.0 standard drinks of alcohol per week. She reports that she does not use drugs.  Allergies  Allergen Reactions   Compazine [Prochlorperazine Edisylate] Other (See Comments)    euphoria   Ivp Dye [Iodinated Diagnostic Agents] Anaphylaxis    "stopped her heart"   Baclofen Other (See Comments)    dizziness   Imitrex [Sumatriptan] Swelling    "throat swelling"   Voltaren [Diclofenac] Other (See Comments)    Cannot hold onto objects (drops things)    Family History  Problem Relation Age of Onset   Breast cancer Mother        60's     Prior to Admission medications   Medication Sig Start Date End Date Taking? Authorizing Provider  acetaminophen (TYLENOL ARTHRITIS PAIN) 650 MG CR tablet Take 1,300 mg by mouth 2 (two) times daily.     [provider]  diphenhydramine-acetaminophen (TYLENOL PM) 25-500 MG TABS tablet Take 2 tablets by mouth at bedtime as needed (sleep).     [provider]  lamoTRIgine (LAMICTAL) 25 MG tablet Take 100 mg by mouth 2 (two) times daily.     [provider]  neomycin-bacitracin-polymyxin (NEOSPORIN) OINT Apply 1 application topically 2 (two) times daily as needed for wound care.    [provider]  OLANZapine (ZYPREXA) 2.5 MG tablet Take 1 tablet (2.5 mg total) by mouth 2 (two) times daily. 01/22/19   Shelly Coss, MD  pantoprazole (PROTONIX) 40 MG tablet Take 1 tablet (40 mg total) by mouth daily. 01/23/19   Shelly Coss, MD  vitamin B-12 1000 MCG tablet Take 1 tablet (1,000 mcg total) by mouth daily. Patient not taking: Reported on 03/02/2019 01/23/19   Shelly Coss, MD    Physical Exam: Vitals:   10/29/20 1915 10/29/20 1930 10/29/20  2200 10/29/20 2230  BP: (!) 171/93 (!) 160/122 (!) 166/105 (!) 157/105  Pulse: (!) 112 (!) 114 (!) 108 (!) 111  Resp: (!) '26 16 14 16  ' Temp:      TempSrc:      SpO2: 100% 94% 95% 92%  Weight:      Height:       Constitutional: Resting in bed with head elevated, NAD, calm, comfortable Eyes: PERRL, lids and conjunctivae normal ENMT: Mucous membranes are dry. Posterior pharynx clear of any exudate or lesions.Normal dentition.  Neck: normal, supple, no masses. Respiratory: clear to auscultation bilaterally, no wheezing, no crackles. Normal respiratory effort. No accessory muscle use.  Cardiovascular: Tachycardic with regular rhythm, no murmurs / rubs / gallops. No extremity edema. 2+ pedal pulses. Abdomen: no tenderness, no masses palpated. No hepatosplenomegaly. Bowel sounds positive.  Musculoskeletal: Long-arm splint placed on RUE, ROM LUE and bilateral lower extremities intact. Skin: no rashes, lesions, ulcers. No induration Neurologic: CN 2-12 grossly  intact. Sensation intact. Strength 5/5 in all 4.  Tremulous left upper extremity. Psychiatric: Normal judgment and insight. Alert and oriented x 3. Normal mood.  Labs on Admission: I have personally reviewed following labs and imaging studies  CBC: Recent Labs  Lab 10/29/20 1633  WBC 9.7  HGB 15.0  HCT 43.5  MCV 115.4*  PLT 836   Basic Metabolic Panel: Recent Labs  Lab 10/29/20 1633  NA 141  K 3.8  CL 111  CO2 15*  GLUCOSE 88  BUN 21*  CREATININE 0.78  CALCIUM 8.8*   GFR: Estimated Creatinine Clearance: 59.9 mL/min (by C-G formula based on SCr of 0.78 mg/dL). Liver Function Tests: Recent Labs  Lab 10/29/20 1900  AST 42*  ALT 20  ALKPHOS 123  BILITOT 0.6  PROT 7.8  ALBUMIN 4.0   Recent Labs  Lab 10/29/20 1900  LIPASE 46   Recent Labs  Lab 10/29/20 2243  AMMONIA 19   Coagulation Profile: Recent Labs  Lab 10/29/20 1900  INR 0.9   Cardiac Enzymes: No results for input(s): CKTOTAL, CKMB, CKMBINDEX,  TROPONINI in the last 168 hours. BNP (last 3 results) No results for input(s): PROBNP in the last 8760 hours. HbA1C: No results for input(s): HGBA1C in the last 72 hours. CBG: No results for input(s): GLUCAP in the last 168 hours. Lipid Profile: No results for input(s): CHOL, HDL, LDLCALC, TRIG, CHOLHDL, LDLDIRECT in the last 72 hours. Thyroid Function Tests: No results for input(s): TSH, T4TOTAL, FREET4, T3FREE, THYROIDAB in the last 72 hours. Anemia Panel: No results for input(s): VITAMINB12, FOLATE, FERRITIN, TIBC, IRON, RETICCTPCT in the last 72 hours. Urine analysis:    Component Value Date/Time   COLORURINE YELLOW 01/15/2019 1211   APPEARANCEUR CLEAR 01/15/2019 1211   APPEARANCEUR Hazy 10/20/2013 1305   LABSPEC 1.009 01/15/2019 1211   LABSPEC 1.017 10/20/2013 1305   PHURINE 6.0 01/15/2019 1211   GLUCOSEU NEGATIVE 01/15/2019 1211   GLUCOSEU Negative 10/20/2013 1305   HGBUR NEGATIVE 01/15/2019 1211   [place] 01/15/2019 1211   BILIRUBINUR Negative 10/20/2013 [place] 01/15/2019 1211   PROTEINUR NEGATIVE 01/15/2019 1211   NITRITE NEGATIVE 01/15/2019 1211   LEUKOCYTESUR TRACE (A) 01/15/2019 1211   LEUKOCYTESUR Trace 10/20/2013 1305    Radiological Exams on Admission: DG Elbow Complete Right  Result Date: 10/29/2020 CLINICAL DATA:  Fall with elbow pain EXAM: RIGHT ELBOW - COMPLETE 3+ VIEW COMPARISON:  01/14/2019 FINDINGS: Positive for elbow effusion. No radial head dislocation. Acute mildly displaced fracture involving the supracondylar humerus superimposed on underlying chronic fracture deformity. IMPRESSION: Acute mildly displaced supracondylar humerus fracture superimposed on underlying chronic fracture deformity. Associated elbow effusion. Electronically Signed   By: Donavan Foil M.D.   On: 10/29/2020 18:23   CT Head Wo Contrast  Result Date: 10/29/2020 CLINICAL DATA:  [place] during the night on Monday. Multiple falls recently. Midline neck  tenderness. EXAM: CT HEAD WITHOUT CONTRAST CT CERVICAL SPINE WITHOUT CONTRAST TECHNIQUE: Multidetector CT imaging of the head and cervical spine was performed following the standard protocol without intravenous contrast. Multiplanar CT image reconstructions of the cervical spine were also generated. COMPARISON:  01/14/2019 FINDINGS: CT HEAD FINDINGS Brain: No evidence of acute infarction, hemorrhage, hydrocephalus, extra-axial collection or mass lesion/mass effect. Mild diffuse cerebral atrophy. Patchy low-attenuation changes in the deep white matter consistent with small vessel ischemia. Vascular: Moderate intracranial arterial vascular calcifications. Skull: Postoperative changes in the midline occipital bone. Small craniostomy in the right posterior parietal region. No acute depressed skull  fractures identified. Sinuses/Orbits: Paranasal sinuses and mastoid air cells are clear. Other: No significant change since prior study. CT CERVICAL SPINE FINDINGS Alignment: Reversal of the usual cervical lordosis. Mild retrolisthesis of C4 on C5 and mild anterolisthesis of C6 on C7. Alignment is unchanged since prior study, likely degenerative. Normal alignment of the facet joints. C1-2 articulation appears intact. Skull base and vertebrae: Postoperative changes in the posterior occipital bone with occipital craniotomy and packing material. No change since prior study. No vertebral compression deformities. No focal bone lesion or bone destruction. Soft tissues and spinal canal: No prevertebral soft tissue swelling. No abnormal paraspinal soft tissue mass or infiltration. Disc levels: Degenerative changes throughout with narrowed interspaces and endplate osteophyte formation. Degenerative changes in the facet joints. Upper chest: Lung apices are clear. Other: None. IMPRESSION: 1. No acute intracranial abnormalities. Chronic atrophy and small vessel ischemic changes. Postoperative changes in the skull base. 2. Alignment of the  cervical spine is unchanged since prior study consistent with degenerative change. No acute displaced fractures identified. Electronically Signed   By: Lucienne Capers M.D.   On: 10/29/2020 20:55   CT Cervical Spine Wo Contrast  Result Date: 10/29/2020 CLINICAL DATA:  [place] during the night on Monday. Multiple falls recently. Midline neck tenderness. EXAM: CT HEAD WITHOUT CONTRAST CT CERVICAL SPINE WITHOUT CONTRAST TECHNIQUE: Multidetector CT imaging of the head and cervical spine was performed following the standard protocol without intravenous contrast. Multiplanar CT image reconstructions of the cervical spine were also generated. COMPARISON:  01/14/2019 FINDINGS: CT HEAD FINDINGS Brain: No evidence of acute infarction, hemorrhage, hydrocephalus, extra-axial collection or mass lesion/mass effect. Mild diffuse cerebral atrophy. Patchy low-attenuation changes in the deep white matter consistent with small vessel ischemia. Vascular: Moderate intracranial arterial vascular calcifications. Skull: Postoperative changes in the midline occipital bone. Small craniostomy in the right posterior parietal region. No acute depressed skull fractures identified. Sinuses/Orbits: Paranasal sinuses and mastoid air cells are clear. Other: No significant change since prior study. CT CERVICAL SPINE FINDINGS Alignment: Reversal of the usual cervical lordosis. Mild retrolisthesis of C4 on C5 and mild anterolisthesis of C6 on C7. Alignment is unchanged since prior study, likely degenerative. Normal alignment of the facet joints. C1-2 articulation appears intact. Skull base and vertebrae: Postoperative changes in the posterior occipital bone with occipital craniotomy and packing material. No change since prior study. No vertebral compression deformities. No focal bone lesion or bone destruction. Soft tissues and spinal canal: No prevertebral soft tissue swelling. No abnormal paraspinal soft tissue mass or infiltration. Disc levels:  Degenerative changes throughout with narrowed interspaces and endplate osteophyte formation. Degenerative changes in the facet joints. Upper chest: Lung apices are clear. Other: None. IMPRESSION: 1. No acute intracranial abnormalities. Chronic atrophy and small vessel ischemic changes. Postoperative changes in the skull base. 2. Alignment of the cervical spine is unchanged since prior study consistent with degenerative change. No acute displaced fractures identified. Electronically Signed   By: Lucienne Capers M.D.   On: 10/29/2020 20:55   CT Elbow Right Wo Contrast  Result Date: 10/29/2020 CLINICAL DATA:  Fracture of elbow. EXAM: CT OF THE LOWER RIGHT EXTREMITY WITHOUT CONTRAST TECHNIQUE: Multidetector CT imaging of the right lower extremity was performed according to the standard protocol. COMPARISON:  Plain film radiographs of the right elbow performed earlier today. FINDINGS: Bones/Joint/Cartilage Acute supracondylar fracture involving the distal humerus is identified. The fracture fragments are in near anatomic alignment. No signs of dislocation. Remote deformity involving the distal diaphysis of the humerus is again  seen. No additional fractures or dislocation identified Ligaments Suboptimally assessed by CT. Muscles and Tendons Unremarkable. Soft tissues Small joint effusion. IMPRESSION: Acute supracondylar fracture involving the distal humerus is identified. The fracture fragments are in near anatomic alignment. Remote distal diaphyseal fracture of the distal humerus. Electronically Signed   By: Kerby Moors M.D.   On: 10/29/2020 21:12   DG Chest Portable 1 View  Result Date: 10/29/2020 CLINICAL DATA:  Weakness.  Fall. EXAM: PORTABLE CHEST 1 VIEW COMPARISON:  01/14/2019 FINDINGS: Chronic hyperinflation and bronchial thickening. Normal heart size and mediastinal contours. No pulmonary edema, pneumothorax, or large pleural effusion. No focal airspace disease. Right humeral arthroplasty. Right elbow  fractures partially included. There are remote bilateral rib fractures. No acute fracture is seen. IMPRESSION: Chronic hyperinflation and bronchial thickening may be due to asthma or COPD. No acute findings. Electronically Signed   By: Keith Rake M.D.   On: 10/29/2020 20:33    EKG: Personally reviewed. Sinus tachycardia, rate 114, RAE.  Rate is faster when compared to prior.  Assessment/Plan Principal Problem:   Alcohol abuse with withdrawal ([place]) Active Problems:   Frequent falls   Right supracondylar humerus fracture, closed, initial encounter   Ariel White is a 60 y.o. White with medical history significant for alcohol use, seizure disorder, bipolar/depression/anxiety, chronic pain/fibromyalgia, frequent falls/balance issues since brain tumor excision in the 1980s who is admitted for management of alcohol withdrawal.  Alcohol use disorder with withdrawal: Admitted with concern for early alcohol withdrawal.  Gives inconsistent alcohol use history. -Placed on CIWA protocol with as needed Ativan -Continue thiamine, folate, MVM -IV fluid hydration overnight  Acute right supracondylar distal humerus fracture: Recurrent after fall at home.  EDP discussed with on-call orthopedics who stated that patient can be set up with outpatient trauma surgery for eventual surgical fixation.  If patient remains in hospital more than 7 days then would need to transfer to [facility] for intervention. -Long-arm splint placed in ED -Continue analgesics as needed  Frequent falls: Chronic related to prior brain tumor resection and exacerbated by alcohol use.  Continue fall precautions.  Request PT/OT eval.  Bipolar disorder/depression/anxiety: Continue home Zyprexa, Lamictal, propranolol, trazodone.  Tobacco use: Reports smoking 1/3 pack/day.  Nicotine patch ordered.  DVT prophylaxis: Lovenox Code Status: DNR, confirmed with patient Family Communication: Discussed with patient, she has discussed  with family Disposition Plan: From home and likely discharge to home pending clinical progress Consults called: EDP discussed with on-call orthopedics, not formally consulted at this time Level of care: Med-Surg Admission status:  Status is: Observation  The patient remains OBS appropriate and will d/c before 2 midnights.  Dispo: The patient is from: Home              Anticipated d/c is to: Home              Patient currently is not medically stable to d/c.   Difficult to place patient No   Zada Finders MD [facility]  If 7PM-7AM, please contact night-coverage [email or web address]  10/29/2020, 11:24 PM

## 2020-10-29 NOTE — ED Provider Notes (Signed)
[facility] Emergency Department Provider Note   ____________________________________________   Event Date/Time   First MD Initiated Contact with Patient 10/29/20 1912     (approximate)  I have reviewed the triage vital signs and the nursing notes.   HISTORY  Chief Complaint Fall and Elbow Injury    HPI Ariel White is a 60 y.o. female with past medical history of bipolar disorder, alcohol abuse, seizures, chronic pain syndrome, and cirrhosis who presents to the ED for fall.  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  She was told at that time that she will have balance issues going forward that will get worse as she ages.  She deals with frequent falls and had one 2 nights ago where she lost her balance, attempted to brace against her dresser but ended up hitting her head on the dresser.  She did not lose consciousness and does not take any blood thinners.  She fell to the ground and struck her right elbow, has been dealing with increasing pain and swelling in her right elbow since then.  She does state that she has been feeling weak lately but denies any fevers, cough, chest pain, shortness of breath, dysuria, flank pain, vomiting, or diarrhea.  She states she limits her oral intake so that she does not have to go to the bathroom at night. She reports occasional alcohol consumption, but is not a daily drinker and denies any history of alcohol withdrawal.        Past Medical History:  Diagnosis Date   Anxiety    Arthritis    Bipolar 1 disorder (HCC)    Chronic pain    Depression    Eczema    Fibromyalgia    GERD (gastroesophageal reflux disease)    Headache    migraines   History of hiatal hernia    Neuromuscular disorder (HCC)    Seizures (HCC)     Patient Active Problem List   Diagnosis Date Noted   Alcohol abuse with withdrawal (HCC)  10/29/2020   Frequent falls 10/29/2020   Right supracondylar humerus fracture, closed, initial encounter 10/29/2020   Fibula fracture 03/02/2019   Closed right fibular fracture 03/02/2019   Bipolar 1 disorder (HCC)    GERD (gastroesophageal reflux disease)    Seizures (HCC)    Syncope 01/15/2019   Altered mental state 01/14/2019   Tobacco abuse 01/14/2019   Anxiety 01/14/2019   Rhabdomyolysis 01/14/2019   Macrocytosis 01/14/2019   Status post fall 01/14/2019   Fall    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) 11/14/2018   Fibromyalgia 09/17/2015   Myofascial pain syndrome 08/29/2015   DDD (degenerative disc disease), cervical 08/29/2015   Pain in joint, shoulder region 08/29/2015   Chronic pain 08/29/2015   Depression, unspecified 02/01/2015   Alcohol use disorder, mild, abuse 02/01/2015   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 Authorizing Provider  acetaminophen (TYLENOL ARTHRITIS PAIN) 650 MG CR tablet Take 1,300 mg by mouth 2 (two) times daily.     [provider]  diphenhydramine-acetaminophen (TYLENOL PM) 25-500 MG TABS tablet Take 2 tablets by mouth at bedtime as needed (sleep).     [provider]  lamoTRIgine (LAMICTAL) 25 MG tablet Take 100 mg by mouth 2 (two) times daily.     [provider]  neomycin-bacitracin-polymyxin (NEOSPORIN) OINT Apply 1 application topically 2 (two) times daily as needed for wound care.    [provider]  OLANZapine (ZYPREXA) 2.5 MG tablet Take 1 tablet (2.5 mg total)  by mouth 2 (two) times daily. 01/22/19   Burnadette Pop, MD  pantoprazole (PROTONIX) 40 MG tablet Take 1 tablet (40 mg total) by mouth daily. 01/23/19   Burnadette Pop, MD  vitamin B-12 1000 MCG tablet Take 1 tablet (1,000 mcg total) by mouth daily. Patient not taking: Reported on 03/02/2019 01/23/19   Burnadette Pop, MD    Allergies Compazine [prochlorperazine edisylate], Ivp dye [iodinated diagnostic agents], Baclofen, Imitrex [sumatriptan], and Voltaren [diclofenac]  Family History  Problem Relation Age of Onset   Breast cancer Mother        97's    Social History Social History   Tobacco Use   Smoking status: Every Day    Packs/day: 0.25    Pack years: 0.00    Types: Cigarettes   Smokeless tobacco: Never  Vaping Use   Vaping Use: Never used  Substance Use Topics   Alcohol use: Yes    Alcohol/week: 2.0 standard drinks    Types: 2 Cans of beer per week    Comment: once per month   Drug use: Never    Comment: 3 TO 5 YEARS    Review of Systems  Constitutional: No fever/chills.  Positive for generalized weakness. Eyes: No visual changes. ENT: No sore throat. Cardiovascular: Denies chest pain. Respiratory: Denies shortness of breath. Gastrointestinal: No abdominal pain.  No nausea, no vomiting.  No diarrhea.  No constipation. Genitourinary: Negative for dysuria. Musculoskeletal: Negative for back pain.  Positive for right elbow pain. Skin: Negative for rash. Neurological: Negative for headaches, focal weakness or numbness.  ____________________________________________   PHYSICAL EXAM:  VITAL SIGNS: ED Triage Vitals  Enc Vitals Group     BP 10/29/20 1633 (!) 134/102     Pulse Rate 10/29/20 1633 (!) 117     Resp 10/29/20 1633 18     Temp 10/29/20 1633 100.2 F (37.9 C)     Temp Source 10/29/20 1633 Oral     SpO2 10/29/20 1633 94 %     Weight 10/29/20 1634 115 lb (52.2 kg)     Height 10/29/20 1634 5\' 2"  (1.575 m)     Head Circumference --      Peak Flow --       Pain Score --      Pain Loc --      Pain Edu? --      Excl. in GC? --     Constitutional: Alert and oriented. Eyes: Conjunctivae are normal. Head: Atraumatic. Nose: No congestion/rhinnorhea. Mouth/Throat: Mucous membranes are moist. Neck: Normal ROM, midline cervical spine tenderness to palpation noted. Cardiovascular: Tachycardic, regular rhythm. Grossly normal heart sounds.  2+ radial pulses bilaterally. Respiratory: Normal respiratory effort.  No retractions. Lungs CTAB. Gastrointestinal: Soft and nontender. No distention. Genitourinary: deferred Musculoskeletal: Diffuse tenderness and edema to right elbow.  No tenderness to palpation at bilateral shoulders, left elbow, or bilateral wrists.  No lower extremity tenderness nor edema. Neurologic:  Normal speech and language. No gross focal neurologic deficits are appreciated.  Tremulous with tongue fasciculations noted. Skin:  Skin is warm, dry and intact. No rash noted. Psychiatric: Mood and affect are normal. Speech and behavior are normal.  ____________________________________________   LABS (all labs ordered are listed, but only abnormal results are displayed)  Labs Reviewed  BASIC METABOLIC PANEL - Abnormal; Notable for the following components:      Result Value   CO2 15 (*)    BUN 21 (*)    Calcium 8.8 (*)    All other components within normal limits  CBC - Abnormal; Notable for the following components:   RBC 3.77 (*)    MCV 115.4 (*)    MCH 39.8 (*)    All other components within normal limits  LACTIC ACID, PLASMA - Abnormal; Notable for the following components:   Lactic Acid, Venous 2.5 (*)    All other components within normal limits  HEPATIC FUNCTION PANEL - Abnormal; Notable for the following components:   AST 42 (*)    All other components within normal limits  ETHANOL - Abnormal; Notable for the following components:   Alcohol, Ethyl (B) 76 (*)    All other components within normal limits  RESP  PANEL BY RT-PCR (FLU A&B, COVID) ARPGX2  LACTIC ACID, PLASMA  LIPASE, BLOOD  AMMONIA  PROTIME-INR  URINALYSIS, COMPLETE (UACMP) WITH MICROSCOPIC  CBG MONITORING, ED  TROPONIN I (HIGH SENSITIVITY)  TROPONIN I (HIGH SENSITIVITY)   ____________________________________________  EKG  ED ECG REPORT I, Chesley Noon, the attending physician, personally viewed and interpreted this ECG.   Date: 10/29/2020  EKG Time: 16:39  Rate: 114  Rhythm: sinus tachycardia  Axis: Normal  Intervals:none  ST&T Change: Diffuse ST depressions   PROCEDURES  Procedure(s) performed (including Critical Care):  Procedures   ____________________________________________   INITIAL IMPRESSION / ASSESSMENT AND PLAN / ED COURSE      60 year old female with past medical history of bipolar disorder, alcohol abuse, cirrhosis, chronic pain syndrome, and seizures who presents to the ED complaining of right elbow pain and swelling following a fall 2 days ago.  Patient is neurovascularly intact to her distal right upper extremity, does have significant tenderness and swelling at the elbow but no other areas of extremity bony tenderness.  X-ray reviewed by me and shows acute minimally displaced supracondylar fracture.  This finding was discussed with Dr. Joice Lofts of orthopedics, who states that patient would require operative repair by trauma team at [facility].  Patient to be placed in posterior long-arm splint on right for now.  CT head and cervical spine are negative for traumatic injury or other acute process.  Patient additionally noted to be tachycardic, hypertensive, and quite tremulous.  She denies daily alcohol use but does have apparent history of cirrhosis talking with her sister.  LFTs and INR are within normal limits, ammonia pending at this time.  Blood alcohol noted to be elevated and patient states "I do not know how that got in my system."  Given lack of alternative explanation, I suspect patient is going  through alcohol withdrawal and we will give dose of IV Ativan, hydrate with IV fluids.  Given her significant tremors and vital sign abnormalities, case discussed with hospitalist for admission.  Per orthopedics, if patient requires admission beyond 7 to 10 days she will need to be transferred to [facility] for operative intervention on her orthopedic injury.  ____________________________________________   FINAL CLINICAL IMPRESSION(S) / ED DIAGNOSES  Final diagnoses:  Fall, initial encounter  Alcohol withdrawal syndrome without complication (HCC)  Closed nondisplaced supracondylar fracture of distal end of right femur with intracondylar extension, initial encounter [facility](HCC)     ED Discharge Orders     None        Note:  This document was prepared using Dragon voice recognition software and may include unintentional dictation errors.    Chesley NoonJessup, Maykel Reitter, MD 10/29/20 2329

## 2020-10-29 NOTE — ED Triage Notes (Signed)
Pt comes into the ED via EMS from home with c/o fall during the night Monday night, pt was not able to get up today on her own, pt has swelling and pain to the right elbow. Per pt sister pt has had multiple falls recently  146/62

## 2020-10-30 DIAGNOSIS — G40909 Epilepsy, unspecified, not intractable, without status epilepticus: Secondary | ICD-10-CM | POA: Diagnosis present

## 2020-10-30 DIAGNOSIS — F319 Bipolar disorder, unspecified: Secondary | ICD-10-CM | POA: Diagnosis present

## 2020-10-30 DIAGNOSIS — F419 Anxiety disorder, unspecified: Secondary | ICD-10-CM | POA: Diagnosis present

## 2020-10-30 DIAGNOSIS — G894 Chronic pain syndrome: Secondary | ICD-10-CM | POA: Diagnosis present

## 2020-10-30 DIAGNOSIS — S42411A Displaced simple supracondylar fracture without intercondylar fracture of right humerus, initial encounter for closed fracture: Secondary | ICD-10-CM | POA: Diagnosis present

## 2020-10-30 DIAGNOSIS — R296 Repeated falls: Secondary | ICD-10-CM | POA: Diagnosis present

## 2020-10-30 DIAGNOSIS — Y92023 Bedroom in mobile home as the place of occurrence of the external cause: Secondary | ICD-10-CM | POA: Diagnosis not present

## 2020-10-30 DIAGNOSIS — Z803 Family history of malignant neoplasm of breast: Secondary | ICD-10-CM | POA: Diagnosis not present

## 2020-10-30 DIAGNOSIS — Z90710 Acquired absence of both cervix and uterus: Secondary | ICD-10-CM | POA: Diagnosis not present

## 2020-10-30 DIAGNOSIS — F10230 Alcohol dependence with withdrawal, uncomplicated: Secondary | ICD-10-CM | POA: Diagnosis present

## 2020-10-30 DIAGNOSIS — Z79899 Other long term (current) drug therapy: Secondary | ICD-10-CM | POA: Diagnosis not present

## 2020-10-30 DIAGNOSIS — M1990 Unspecified osteoarthritis, unspecified site: Secondary | ICD-10-CM | POA: Diagnosis present

## 2020-10-30 DIAGNOSIS — K219 Gastro-esophageal reflux disease without esophagitis: Secondary | ICD-10-CM | POA: Diagnosis present

## 2020-10-30 DIAGNOSIS — F17210 Nicotine dependence, cigarettes, uncomplicated: Secondary | ICD-10-CM | POA: Diagnosis present

## 2020-10-30 DIAGNOSIS — Z66 Do not resuscitate: Secondary | ICD-10-CM | POA: Diagnosis present

## 2020-10-30 DIAGNOSIS — M797 Fibromyalgia: Secondary | ICD-10-CM | POA: Diagnosis present

## 2020-10-30 DIAGNOSIS — Z8603 Personal history of neoplasm of uncertain behavior: Secondary | ICD-10-CM | POA: Diagnosis not present

## 2020-10-30 DIAGNOSIS — Z20822 Contact with and (suspected) exposure to covid-19: Secondary | ICD-10-CM | POA: Diagnosis present

## 2020-10-30 DIAGNOSIS — Z888 Allergy status to other drugs, medicaments and biological substances status: Secondary | ICD-10-CM | POA: Diagnosis not present

## 2020-10-30 DIAGNOSIS — S72464A Nondisplaced supracondylar fracture with intracondylar extension of lower end of right femur, initial encounter for closed fracture: Secondary | ICD-10-CM | POA: Diagnosis not present

## 2020-10-30 DIAGNOSIS — L309 Dermatitis, unspecified: Secondary | ICD-10-CM | POA: Diagnosis present

## 2020-10-30 DIAGNOSIS — F10139 Alcohol abuse with withdrawal, unspecified: Secondary | ICD-10-CM | POA: Diagnosis not present

## 2020-10-30 DIAGNOSIS — K7460 Unspecified cirrhosis of liver: Secondary | ICD-10-CM | POA: Diagnosis present

## 2020-10-30 LAB — URINALYSIS, COMPLETE (UACMP) WITH MICROSCOPIC
Bilirubin Urine: NEGATIVE
Glucose, UA: NEGATIVE mg/dL
Hgb urine dipstick: NEGATIVE
Ketones, ur: 20 mg/dL — AB
Nitrite: NEGATIVE
Protein, ur: 30 mg/dL — AB
Specific Gravity, Urine: 1.02 (ref 1.005–1.030)
pH: 5 (ref 5.0–8.0)

## 2020-10-30 LAB — CBC
HCT: 35.3 % — ABNORMAL LOW (ref 36.0–46.0)
Hemoglobin: 12.2 g/dL (ref 12.0–15.0)
MCH: 40.8 pg — ABNORMAL HIGH (ref 26.0–34.0)
MCHC: 34.6 g/dL (ref 30.0–36.0)
MCV: 118.1 fL — ABNORMAL HIGH (ref 80.0–100.0)
Platelets: 186 10*3/uL (ref 150–400)
RBC: 2.99 MIL/uL — ABNORMAL LOW (ref 3.87–5.11)
RDW: 13.4 % (ref 11.5–15.5)
WBC: 5.8 10*3/uL (ref 4.0–10.5)
nRBC: 0 % (ref 0.0–0.2)

## 2020-10-30 LAB — COMPREHENSIVE METABOLIC PANEL
ALT: 15 U/L (ref 0–44)
AST: 31 U/L (ref 15–41)
Albumin: 3.2 g/dL — ABNORMAL LOW (ref 3.5–5.0)
Alkaline Phosphatase: 109 U/L (ref 38–126)
Anion gap: 10 (ref 5–15)
BUN: 15 mg/dL (ref 6–20)
CO2: 22 mmol/L (ref 22–32)
Calcium: 8.3 mg/dL — ABNORMAL LOW (ref 8.9–10.3)
Chloride: 102 mmol/L (ref 98–111)
Creatinine, Ser: 0.68 mg/dL (ref 0.44–1.00)
GFR, Estimated: >60 mL/min (ref >60)
Glucose, Bld: 76 mg/dL (ref 70–99)
Potassium: 3.3 mmol/L — ABNORMAL LOW (ref 3.5–5.1)
Sodium: 134 mmol/L — ABNORMAL LOW (ref 135–145)
Total Bilirubin: 1.3 mg/dL — ABNORMAL HIGH (ref 0.3–1.2)
Total Protein: 6.2 g/dL — ABNORMAL LOW (ref 6.5–8.1)

## 2020-10-30 LAB — RESP PANEL BY RT-PCR (FLU A&B, COVID) ARPGX2
Influenza A by PCR: NEGATIVE
Influenza B by PCR: NEGATIVE
SARS Coronavirus 2 by RT PCR: NEGATIVE

## 2020-10-30 LAB — PHOSPHORUS: Phosphorus: 2.7 mg/dL (ref 2.5–4.6)

## 2020-10-30 LAB — HIV ANTIBODY (ROUTINE TESTING W REFLEX): HIV Screen 4th Generation wRfx: NONREACTIVE

## 2020-10-30 LAB — MAGNESIUM: Magnesium: 1.5 mg/dL — ABNORMAL LOW (ref 1.7–2.4)

## 2020-10-30 MED ORDER — HYDROCODONE-ACETAMINOPHEN 5-325 MG PO TABS
2.0000 | ORAL_TABLET | Freq: Once | ORAL | Status: AC
  Administered 2020-10-30: 2 via ORAL
  Filled 2020-10-30: qty 2

## 2020-10-30 MED ORDER — PROPRANOLOL HCL 20 MG PO TABS
40.0000 mg | ORAL_TABLET | Freq: Two times a day (BID) | ORAL | Status: DC
  Administered 2020-10-30 – 2020-11-06 (×15): 40 mg via ORAL
  Filled 2020-10-30 (×16): qty 2

## 2020-10-30 MED ORDER — TRAZODONE HCL 50 MG PO TABS
150.0000 mg | ORAL_TABLET | Freq: Every day | ORAL | Status: DC
  Administered 2020-10-30 – 2020-11-05 (×7): 150 mg via ORAL
  Filled 2020-10-30 (×9): qty 1

## 2020-10-30 MED ORDER — LABETALOL HCL 5 MG/ML IV SOLN
10.0000 mg | INTRAVENOUS | Status: DC | PRN
Start: 2020-10-30 — End: 2020-11-06

## 2020-10-30 MED ORDER — OLANZAPINE 5 MG PO TABS
2.5000 mg | ORAL_TABLET | Freq: Two times a day (BID) | ORAL | Status: DC
  Administered 2020-10-30 – 2020-11-06 (×16): 2.5 mg via ORAL
  Filled 2020-10-30 (×16): qty 1

## 2020-10-30 MED ORDER — PANTOPRAZOLE SODIUM 40 MG PO TBEC
40.0000 mg | DELAYED_RELEASE_TABLET | Freq: Every day | ORAL | Status: DC
  Administered 2020-10-30 – 2020-11-06 (×8): 40 mg via ORAL
  Filled 2020-10-30 (×8): qty 1

## 2020-10-30 MED ORDER — LAMOTRIGINE 25 MG PO TABS
100.0000 mg | ORAL_TABLET | Freq: Two times a day (BID) | ORAL | Status: DC
  Administered 2020-10-30 – 2020-11-06 (×15): 100 mg via ORAL
  Filled 2020-10-30 (×17): qty 4

## 2020-10-30 NOTE — Progress Notes (Signed)
OT Cancellation Note  Patient Details Name: Ariel White MRN: [id number] DOB: November 24, 1960   Cancelled Treatment:    Reason Eval/Treat Not Completed: Pain limiting ability to participate. Upon arrival pt reclined in bed, OT role explained and pt stating "I don't care." Pt premedicated for session and RN reports pt recently up to bathroom with +1 assist however at this time pt reporting 10/10 pain (RN notified) and refusing OOB assessment. Attempted to visualize R arm splint and pt began screaming/crying - session ended. Will follow up at later date/time as able to initiate assessment.  Kathie Dike, M.S. OTR/L  10/30/20, 9:01 AM  ascom [phone]

## 2020-10-30 NOTE — TOC Progression Note (Signed)
Transition of Care [facility]) - Progression Note    Patient Details  Name: Ariel White MRN: [id number] Date of Birth: [birth year]  [facility] CM/SW Contact  Barrie Dunker, RN Phone Number: 10/30/2020, 3:36 PM  Clinical Narrative:     Spoke with the patient about DC plan, she lives alone and is agreeable to go to SNF for STR, PASSR obtained, Fl2 completed, Bedsearch sent, will review offers once obtained       Expected Discharge Plan and Services                                                 Social Determinants of Health (SDOH) Interventions    Readmission Risk Interventions No flowsheet data found.

## 2020-10-30 NOTE — Plan of Care (Signed)

## 2020-10-30 NOTE — Evaluation (Signed)
Physical Therapy Evaluation Patient Details Name: Ariel White MRN: [id number] DOB: June 28, 1960 Today's Date: 10/30/2020   History of Present Illness  Per MD notes, pt is a 60 y.o. female who presented to the ED for evaluation of right elbow pain after a fall at home night of 10/27/2020.  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 MD assessment includes alcohol use disorder with withdrawal, acute R supracondylar distal humerus fx, and frequent falls.  Clinical Impression  Pt was pleasant and willing to participate during the session. She was a little lethargic initially but became more alert with physical activity. Min guard assist required for transfers and gait and verbal cueing for sequencing. Pt demonstrated mild unsteadiness walking slowly with decreased step length and wide BOS but no LOB.  Pt limited with walking distance secondary to generalized weakness and pain.  Pt will benefit from PT services in a SNF setting upon discharge to safely address deficits listed in patient problem list for decreased caregiver assistance and eventual return to PLOF.     Follow Up Recommendations SNF;Supervision/Assistance - 24 hour    Equipment Recommendations  None recommended by PT    Recommendations for Other Services       Precautions / Restrictions Precautions Required Braces or Orthoses: Splint/Cast Splint/Cast: long arm splint to R UE Restrictions Weight Bearing Restrictions: Yes RUE Weight Bearing: Non weight bearing      Mobility  Bed Mobility Overal bed mobility: Needs Assistance Bed Mobility: Supine to Sit     Supine to sit: Supervision     General bed mobility comments: Verbal cues for sequencing. Transition towards her left side and heavy use of bed rail.    Transfers Overall transfer level: Needs assistance   Transfers: Sit to/from  Stand Sit to Stand: Min guard         General transfer comment: Verbal cues for sequencing.  Ambulation/Gait Ambulation/Gait assistance: Min guard Gait Distance (Feet): 15 Feet Assistive device: 1 person hand held assist Gait Pattern/deviations: Step-to pattern;Decreased step length - right;Decreased step length - left;Decreased stride length;Wide base of support Gait velocity: decreased   General Gait Details: Walked slowly with wide base of support and decreased step and stride length.  Stairs            Wheelchair Mobility    Modified Rankin (Stroke Patients Only)       Balance Overall balance assessment: Needs assistance Sitting-balance support: Single extremity supported Sitting balance-Leahy Scale: Fair     Standing balance support: No upper extremity supported Standing balance-Leahy Scale: Fair                               Pertinent Vitals/Pain Pain Assessment: 0-10 Pain Score: 9  Pain Location: R elbow Pain Descriptors / Indicators: Aching;Sore Pain Intervention(s): Monitored during session;Repositioned;Patient requesting pain meds-RN notified    Home Living Family/patient expects to be discharged to:: Private residence Living Arrangements: Alone Available Help at Discharge: Family;Friend(s);Available PRN/intermittently Type of Home: Mobile home Home Access: Stairs to enter Entrance Stairs-Rails: Can reach both Entrance Stairs-Number of Steps: 3 Home Layout: One level Home Equipment: Cane - single point;Walker - 2 wheels;Bedside commode      Prior Function Level of Independence: Independent         Comments: Pt is independent with ambulation and ADLs with rest breaks intermittently. She avoids walking in grass  because the ground is uneven. Pt reports 2-3 falls within the last year 2/2 LOB.     Hand Dominance   Dominant Hand: Right    Extremity/Trunk Assessment   Upper Extremity Assessment Upper Extremity Assessment:  Defer to OT evaluation (Able to flex/extend all fingers, limited ROM making a fist)    Lower Extremity Assessment Lower Extremity Assessment: Overall WFL for tasks assessed; grossly at least 4/5    Cervical / Trunk Assessment Cervical / Trunk Assessment: Kyphotic  Communication   Communication: No difficulties  Cognition Arousal/Alertness: Lethargic Behavior During Therapy: WFL for tasks assessed/performed Overall Cognitive Status: Within Functional Limits for tasks assessed                                 General Comments: Pt lethargic initially falling asleep intermittently for history questions but more alert with activity.      General Comments      Exercises Total Joint Exercises Long Arc Quad: AROM;Both;10 reps;Seated Marching in Standing: AROM;Both;10 reps;Standing Other Exercises Other Exercises: Static sitting 3-5 min for increased trunk control and balance. Other Exercises: Static standing 1-2 min with min guard A for improved activity tolerance.   Assessment/Plan    PT Assessment Patient needs continued PT services  PT Problem List Decreased strength;Decreased activity tolerance;Decreased balance;Decreased mobility;Decreased knowledge of use of DME;Decreased safety awareness;Pain       PT Treatment Interventions DME instruction;Gait training;Stair training;Functional mobility training;Therapeutic activities;Therapeutic exercise;Balance training;Patient/family education    PT Goals (Current goals can be found in the Care Plan section)  Acute Rehab PT Goals Patient Stated Goal: Go back home PT Goal Formulation: With patient Time For Goal Achievement: 11/12/20 Potential to Achieve Goals: Good    Frequency 7X/week   Barriers to discharge        Co-evaluation               AM-PAC PT "6 Clicks" Mobility  Outcome Measure Help needed turning from your back to your side while in a flat bed without using bedrails?: A Little Help needed  moving from lying on your back to sitting on the side of a flat bed without using bedrails?: A Little Help needed moving to and from a bed to a chair (including a wheelchair)?: A Little Help needed standing up from a chair using your arms (e.g., wheelchair or bedside chair)?: A Little Help needed to walk in hospital room?: A Little Help needed climbing 3-5 steps with a railing? : A Lot 6 Click Score: 17    End of Session Equipment Utilized During Treatment: Gait belt Activity Tolerance: Patient tolerated treatment well Patient left: in chair;with call bell/phone within reach;with chair alarm set;with nursing/sitter in room Nurse Communication: Mobility status;Patient requests pain meds PT Visit Diagnosis: Unsteadiness on feet (R26.81);History of falling (Z91.81);Muscle weakness (generalized) (M62.81);Pain Pain - Right/Left: Right Pain - part of body: Arm    Time: 7619-5093 PT Time Calculation (min) (ACUTE ONLY): 34 min   Charges:              Desiree Hane SPT 10/30/20, 2:02 PM

## 2020-10-30 NOTE — [place] FL2 (Signed)
[place] MEDICAID FL2 LEVEL OF CARE SCREENING TOOL     IDENTIFICATION  Patient Name: Ariel White Birthdate: 06-27-1960 Sex: female Admission Date (Current Location): 10/29/2020  [facility] and [place] Number:  Chiropodist and Address:  [facility], [address] [place], [place] [address]      Provider Number: [id number]  Attending Physician Name and Address:  Darlin Priestly, MD  Relative Name and Phone Number:  Georgeanna Lea [phone]    Current Level of Care: Hospital Recommended Level of Care: Skilled Nursing Facility Prior Approval Number:    Date Approved/Denied:   PASRR Number: [id number]  Discharge Plan: SNF    Current Diagnoses: Patient Active Problem List   Diagnosis Date Noted   Falls frequently 10/30/2020   Alcohol abuse with withdrawal (HCC) 10/29/2020   Frequent falls 10/29/2020   Right supracondylar humerus fracture, closed, initial encounter 10/29/2020   Fibula fracture 03/02/2019   Closed right fibular fracture 03/02/2019   Bipolar 1 disorder (HCC)    GERD (gastroesophageal reflux disease)    Seizures (HCC)    Syncope 01/15/2019   Altered mental state 01/14/2019   Tobacco abuse 01/14/2019   Anxiety 01/14/2019   Rhabdomyolysis 01/14/2019   Macrocytosis 01/14/2019   Status post fall 01/14/2019   Fall    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) 11/14/2018   Fibromyalgia 09/17/2015   Myofascial pain syndrome 08/29/2015   DDD (degenerative disc disease), cervical 08/29/2015   Pain in joint, shoulder region 08/29/2015   Chronic pain 08/29/2015   Depression, unspecified 02/01/2015   Alcohol use disorder, mild, abuse 02/01/2015   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    Orientation RESPIRATION BLADDER Height & Weight     Self, Time, Situation, Place  Normal External catheter Weight: 52.2 kg Height:  5\' 2"  (157.5 cm)  BEHAVIORAL SYMPTOMS/MOOD NEUROLOGICAL BOWEL NUTRITION  STATUS      Continent Diet (regular)  AMBULATORY STATUS COMMUNICATION OF NEEDS Skin   Extensive Assist Verbally Normal                       Personal Care Assistance Level of Assistance  Dressing, Bathing Bathing Assistance: Limited assistance   Dressing Assistance: Limited assistance     Functional Limitations Info             SPECIAL CARE FACTORS FREQUENCY  PT (By licensed PT), OT (By licensed OT)     PT Frequency: 5 times per week OT Frequency: 3 times per week            Contractures Contractures Info: Not present    Additional Factors Info  Code Status, Allergies Code Status Info: DNR Allergies Info: Compazine Prochlorperazine Edisylate, Ivp Dye Iodinated Diagnostic Agents, Baclofen, Imitrex Sumatriptan, Voltaren           Current Medications (10/30/2020):  This is the current hospital active medication list Current Facility-Administered Medications  Medication Dose Route Frequency Provider Last Rate Last Admin   acetaminophen (TYLENOL) tablet 650 mg  650 mg Oral Q6H PRN 12/31/2020, MD       Or   acetaminophen (TYLENOL) suppository 650 mg  650 mg Rectal Q6H PRN Charlsie Quest R, MD       enoxaparin (LOVENOX) injection 40 mg  40 mg Subcutaneous Q24H Darreld Mclean R, MD   40 mg at 10/30/20 0151   folic acid (FOLVITE) tablet 1 mg  1 mg Oral Daily 12/31/20, MD  1 mg at 10/30/20 0916   labetalol (NORMODYNE) injection 10 mg  10 mg Intravenous Q4H PRN Charlsie Quest, MD       lamoTRIgine (LAMICTAL) tablet 100 mg  100 mg Oral BID Darreld Mclean R, MD   100 mg at 10/30/20 0916   LORazepam (ATIVAN) tablet 1-4 mg  1-4 mg Oral Q1H PRN Charlsie Quest, MD       Or   LORazepam (ATIVAN) injection 1-4 mg  1-4 mg Intravenous Q1H PRN Darreld Mclean R, MD   4 mg at 10/30/20 1046   multivitamin with minerals tablet 1 tablet  1 tablet Oral Daily Charlsie Quest, MD   1 tablet at 10/30/20 0916   nicotine (NICODERM CQ - dosed in mg/24 hours) patch 14 mg  14 mg  Transdermal Daily Darreld Mclean R, MD   14 mg at 10/30/20 0917   OLANZapine (ZYPREXA) tablet 2.5 mg  2.5 mg Oral BID Darreld Mclean R, MD   2.5 mg at 10/30/20 0915   ondansetron (ZOFRAN) tablet 4 mg  4 mg Oral Q6H PRN Charlsie Quest, MD       Or   ondansetron (ZOFRAN) injection 4 mg  4 mg Intravenous Q6H PRN Darreld Mclean R, MD       oxyCODONE (Oxy IR/ROXICODONE) immediate release tablet 5 mg  5 mg Oral Q4H PRN Darreld Mclean R, MD   5 mg at 10/30/20 1343   pantoprazole (PROTONIX) EC tablet 40 mg  40 mg Oral Daily Darreld Mclean R, MD   40 mg at 10/30/20 0916   propranolol (INDERAL) tablet 40 mg  40 mg Oral BID Darreld Mclean R, MD   40 mg at 10/30/20 0916   senna-docusate (Senokot-S) tablet 1 tablet  1 tablet Oral QHS PRN Darreld Mclean R, MD       sodium chloride flush (NS) 0.9 % injection 3 mL  3 mL Intravenous Q12H Darreld Mclean R, MD   3 mL at 10/30/20 6333   thiamine tablet 100 mg  100 mg Oral Daily Darreld Mclean R, MD   100 mg at 10/30/20 5456   Or   thiamine (B-1) injection 100 mg  100 mg Intravenous Daily Charlsie Quest, MD       traZODone (DESYREL) tablet 150 mg  150 mg Oral QHS Charlsie Quest, MD         Discharge Medications: Please see discharge summary for a list of discharge medications.  Relevant Imaging Results:  Relevant Lab Results:   Additional Information SSN: [id number]  Barrie Dunker, RN

## 2020-10-30 NOTE — Progress Notes (Signed)
PROGRESS NOTE    JOSEFA SYRACUSE  IWL:[id number] DOB: October 03, 1960 DOA: 10/29/2020 PCP: Carren Rang, PA-C  [id number]   Assessment & Plan:   Principal Problem:   Alcohol abuse with withdrawal [facility]) Active Problems:   Frequent falls   Right supracondylar humerus fracture, closed, initial encounter   Falls frequently   Ariel White is a 60 y.o. 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   Patient reports chronic balance issues resulting in frequent falls which has been an ongoing issue since she had a brain tumor excision in 1985.  She says night of 10/27/2020 she was getting up to go use the restroom when she lost her balance and fell onto the ground.  She hit her right elbow.  She said she did hit part of her head but did not lose consciousness.   Alcohol use disorder with withdrawal: Admitted with concern for early alcohol withdrawal.  gave inconsistent alcohol use history. --cont CIWA -Continue thiamine, folate, MVM  Acute right supracondylar distal humerus fracture: after fall at home.  EDP discussed with on-call orthopedics who stated that patient can be set up with outpatient trauma surgery for eventual surgical fixation.  If patient remains in hospital more than 7 days then would need to transfer to [facility] for intervention. -Long-arm splint placed in ED --oxycodone 5 mg q4h PRN   Chronic balance issues and Frequent falls: Chronic related to prior brain tumor resection and exacerbated by alcohol use.   --PT/OT rec SNF   Bipolar disorder/depression/anxiety: On home Zyprexa, Lamictal, propranolol, trazodone. --cont home regimen   Tobacco use: Reports smoking 1/3 pack/day.   --cont nicotine patch   DVT  prophylaxis: Lovenox SQ Code Status: DNR  Family Communication:  Level of care: Med-Surg Dispo:   The patient is from: home Anticipated d/c is to: SNF Anticipated d/c date is: 1-2 days Patient currently is not medically ready to d/c due to: receiving ativan for alcohol withdrawal.    Subjective and Interval History:  Pt complained of uncontrolled pain in her right elbow.  Also found to be agitated and received ativan per CIWA.  Pt then became very sleepy later in the day.     Objective: Vitals:   10/30/20 0647 10/30/20 0807 10/30/20 1215 10/30/20 1614  BP: 139/81 138/83 121/80 137/73  Pulse: 84 87 81 89  Resp: 18 18 16 18   Temp: 98.7 F (37.1 C) 98.7 F (37.1 C) 98.5 F (36.9 C) 98.1 F (36.7 C)  TempSrc: Oral Oral Oral   SpO2: 93% 94% 92% 100%  Weight:      Height:        Intake/Output Summary (Last 24 hours) at 10/30/2020 1805 Last data filed at 10/30/2020 1319 Gross per 24 hour  Intake 1239 ml  Output --  Net 1239 ml   Filed Weights   10/29/20 1634  Weight: 52.2 kg    Examination:   Constitutional: NAD, sleeping, arousable, oriented  HEENT: conjunctivae and lids normal, EOMI CV: No cyanosis.   RESP: normal respiratory effort, on RA Extremities: No effusions, edema in BLE.  Right arm in sling. SKIN: warm, dry Neuro: II - XII grossly intact.   Psych: depressed mood and affect.     Data Reviewed: I have personally reviewed following labs and imaging  studies  CBC: Recent Labs  Lab 10/29/20 1633 10/30/20 0657  WBC 9.7 5.8  HGB 15.0 12.2  HCT 43.5 35.3*  MCV 115.4* 118.1*  PLT 248 186   Basic Metabolic Panel: Recent Labs  Lab 10/29/20 1633 10/30/20 0657  NA 141 134*  K 3.8 3.3*  CL 111 102  CO2 15* 22  GLUCOSE 88 76  BUN 21* 15  CREATININE 0.78 0.68  CALCIUM 8.8* 8.3*  MG  --  1.5*  PHOS  --  2.7   GFR: Estimated Creatinine Clearance: 59.9 mL/min (by C-G formula based on SCr of 0.68 mg/dL). Liver Function Tests: Recent Labs  Lab  10/29/20 1900 10/30/20 0657  AST 42* 31  ALT 20 15  ALKPHOS 123 109  BILITOT 0.6 1.3*  PROT 7.8 6.2*  ALBUMIN 4.0 3.2*   Recent Labs  Lab 10/29/20 1900  LIPASE 46   Recent Labs  Lab 10/29/20 2243  AMMONIA 19   Coagulation Profile: Recent Labs  Lab 10/29/20 1900  INR 0.9   Cardiac Enzymes: No results for input(s): CKTOTAL, CKMB, CKMBINDEX, TROPONINI in the last 168 hours. BNP (last 3 results) No results for input(s): PROBNP in the last 8760 hours. HbA1C: No results for input(s): HGBA1C in the last 72 hours. CBG: No results for input(s): GLUCAP in the last 168 hours. Lipid Profile: No results for input(s): CHOL, HDL, LDLCALC, TRIG, CHOLHDL, LDLDIRECT in the last 72 hours. Thyroid Function Tests: No results for input(s): TSH, T4TOTAL, FREET4, T3FREE, THYROIDAB in the last 72 hours. Anemia Panel: No results for input(s): VITAMINB12, FOLATE, FERRITIN, TIBC, IRON, RETICCTPCT in the last 72 hours. Sepsis Labs: Recent Labs  Lab 10/29/20 1715 10/29/20 2243  LATICACIDVEN 2.5* 1.5    Recent Results (from the past 240 hour(s))  Resp Panel by RT-PCR (Flu A&B, Covid) Nasopharyngeal Swab     Status: None   Collection Time: 10/29/20 10:43 PM   Specimen: Nasopharyngeal Swab; Nasopharyngeal(NP) swabs in vial transport medium  Result Value Ref Range Status   SARS Coronavirus 2 by RT PCR NEGATIVE NEGATIVE Final    Comment: (NOTE) SARS-CoV-2 target nucleic acids are NOT DETECTED.  The SARS-CoV-2 RNA is generally detectable in upper respiratory specimens during the acute phase of infection. The lowest concentration of SARS-CoV-2 viral copies this assay can detect is 138 copies/mL. A negative result does not preclude SARS-Cov-2 infection and should not be used as the sole basis for treatment or other patient management decisions. A negative result may occur with  improper specimen collection/handling, submission of specimen other than nasopharyngeal swab, presence of viral  mutation(s) within the areas targeted by this assay, and inadequate number of viral copies(<138 copies/mL). A negative result must be combined with clinical observations, patient history, and epidemiological information. The expected result is Negative.  Fact Sheet for Patients:  BloggerCourse.com  Fact Sheet for Healthcare Providers:  SeriousBroker.it  This test is no t yet approved or cleared by the [place] FDA and  has been authorized for detection and/or diagnosis of SARS-CoV-2 by FDA under an Emergency Use Authorization (EUA). This EUA will remain  in effect (meaning this test can be used) for the duration of the COVID-19 declaration under Section 564(b)(1) of the Act, 21 U.S.C.section 360bbb-3(b)(1), unless the authorization is terminated  or revoked sooner.       Influenza A by PCR NEGATIVE NEGATIVE Final   Influenza B by PCR NEGATIVE NEGATIVE Final    Comment: (NOTE) The Xpert Xpress SARS-CoV-2/FLU/RSV plus assay is intended as  an aid in the diagnosis of influenza from Nasopharyngeal swab specimens and should not be used as a sole basis for treatment. Nasal washings and aspirates are unacceptable for Xpert Xpress SARS-CoV-2/FLU/RSV testing.  Fact Sheet for Patients: BloggerCourse.comhttps://www.fda.gov/media/152166/download  Fact Sheet for Healthcare Providers: SeriousBroker.ithttps://www.fda.gov/media/152162/download  This test is not yet approved or cleared by the [place] FDA and has been authorized for detection and/or diagnosis of SARS-CoV-2 by FDA under an Emergency Use Authorization (EUA). This EUA will remain in effect (meaning this test can be used) for the duration of the COVID-19 declaration under Section 564(b)(1) of the Act, 21 U.S.C. section 360bbb-3(b)(1), unless the authorization is terminated or revoked.  Performed at [facility], [address] [place], [place] [address]       Radiology Studies: DG  Elbow Complete Right  Result Date: 10/29/2020 CLINICAL DATA:  Fall with elbow pain EXAM: RIGHT ELBOW - COMPLETE 3+ VIEW COMPARISON:  01/14/2019 FINDINGS: Positive for elbow effusion. No radial head dislocation. Acute mildly displaced fracture involving the supracondylar humerus superimposed on underlying chronic fracture deformity. IMPRESSION: Acute mildly displaced supracondylar humerus fracture superimposed on underlying chronic fracture deformity. Associated elbow effusion. Electronically Signed   By: Jasmine PangKim  Fujinaga M.D.   On: 10/29/2020 18:23   CT Head Wo Contrast  Result Date: 10/29/2020 CLINICAL DATA:  Larey SeatFell during the night on Monday. Multiple falls recently. Midline neck tenderness. EXAM: CT HEAD WITHOUT CONTRAST CT CERVICAL SPINE WITHOUT CONTRAST TECHNIQUE: Multidetector CT imaging of the head and cervical spine was performed following the standard protocol without intravenous contrast. Multiplanar CT image reconstructions of the cervical spine were also generated. COMPARISON:  01/14/2019 FINDINGS: CT HEAD FINDINGS Brain: No evidence of acute infarction, hemorrhage, hydrocephalus, extra-axial collection or mass lesion/mass effect. Mild diffuse cerebral atrophy. Patchy low-attenuation changes in the deep white matter consistent with small vessel ischemia. Vascular: Moderate intracranial arterial vascular calcifications. Skull: Postoperative changes in the midline occipital bone. Small craniostomy in the right posterior parietal region. No acute depressed skull fractures identified. Sinuses/Orbits: Paranasal sinuses and mastoid air cells are clear. Other: No significant change since prior study. CT CERVICAL SPINE FINDINGS Alignment: Reversal of the usual cervical lordosis. Mild retrolisthesis of C4 on C5 and mild anterolisthesis of C6 on C7. Alignment is unchanged since prior study, likely degenerative. Normal alignment of the facet joints. C1-2 articulation appears intact. Skull base and vertebrae:  Postoperative changes in the posterior occipital bone with occipital craniotomy and packing material. No change since prior study. No vertebral compression deformities. No focal bone lesion or bone destruction. Soft tissues and spinal canal: No prevertebral soft tissue swelling. No abnormal paraspinal soft tissue mass or infiltration. Disc levels: Degenerative changes throughout with narrowed interspaces and endplate osteophyte formation. Degenerative changes in the facet joints. Upper chest: Lung apices are clear. Other: None. IMPRESSION: 1. No acute intracranial abnormalities. Chronic atrophy and small vessel ischemic changes. Postoperative changes in the skull base. 2. Alignment of the cervical spine is unchanged since prior study consistent with degenerative change. No acute displaced fractures identified. Electronically Signed   By: Burman NievesWilliam  Stevens M.D.   On: 10/29/2020 20:55   CT Cervical Spine Wo Contrast  Result Date: 10/29/2020 CLINICAL DATA:  Larey SeatFell during the night on Monday. Multiple falls recently. Midline neck tenderness. EXAM: CT HEAD WITHOUT CONTRAST CT CERVICAL SPINE WITHOUT CONTRAST TECHNIQUE: Multidetector CT imaging of the head and cervical spine was performed following the standard protocol without intravenous contrast. Multiplanar CT image reconstructions of the cervical spine were also generated.  COMPARISON:  01/14/2019 FINDINGS: CT HEAD FINDINGS Brain: No evidence of acute infarction, hemorrhage, hydrocephalus, extra-axial collection or mass lesion/mass effect. Mild diffuse cerebral atrophy. Patchy low-attenuation changes in the deep white matter consistent with small vessel ischemia. Vascular: Moderate intracranial arterial vascular calcifications. Skull: Postoperative changes in the midline occipital bone. Small craniostomy in the right posterior parietal region. No acute depressed skull fractures identified. Sinuses/Orbits: Paranasal sinuses and mastoid air cells are clear. Other: No  significant change since prior study. CT CERVICAL SPINE FINDINGS Alignment: Reversal of the usual cervical lordosis. Mild retrolisthesis of C4 on C5 and mild anterolisthesis of C6 on C7. Alignment is unchanged since prior study, likely degenerative. Normal alignment of the facet joints. C1-2 articulation appears intact. Skull base and vertebrae: Postoperative changes in the posterior occipital bone with occipital craniotomy and packing material. No change since prior study. No vertebral compression deformities. No focal bone lesion or bone destruction. Soft tissues and spinal canal: No prevertebral soft tissue swelling. No abnormal paraspinal soft tissue mass or infiltration. Disc levels: Degenerative changes throughout with narrowed interspaces and endplate osteophyte formation. Degenerative changes in the facet joints. Upper chest: Lung apices are clear. Other: None. IMPRESSION: 1. No acute intracranial abnormalities. Chronic atrophy and small vessel ischemic changes. Postoperative changes in the skull base. 2. Alignment of the cervical spine is unchanged since prior study consistent with degenerative change. No acute displaced fractures identified. Electronically Signed   By: Burman Nieves M.D.   On: 10/29/2020 20:55   CT Elbow Right Wo Contrast  Result Date: 10/29/2020 CLINICAL DATA:  Fracture of elbow. EXAM: CT OF THE LOWER RIGHT EXTREMITY WITHOUT CONTRAST TECHNIQUE: Multidetector CT imaging of the right lower extremity was performed according to the standard protocol. COMPARISON:  Plain film radiographs of the right elbow performed earlier today. FINDINGS: Bones/Joint/Cartilage Acute supracondylar fracture involving the distal humerus is identified. The fracture fragments are in near anatomic alignment. No signs of dislocation. Remote deformity involving the distal diaphysis of the humerus is again seen. No additional fractures or dislocation identified Ligaments Suboptimally assessed by CT. Muscles and  Tendons Unremarkable. Soft tissues Small joint effusion. IMPRESSION: Acute supracondylar fracture involving the distal humerus is identified. The fracture fragments are in near anatomic alignment. Remote distal diaphyseal fracture of the distal humerus. Electronically Signed   By: Signa Kell M.D.   On: 10/29/2020 21:12   DG Chest Portable 1 View  Result Date: 10/29/2020 CLINICAL DATA:  Weakness.  Fall. EXAM: PORTABLE CHEST 1 VIEW COMPARISON:  01/14/2019 FINDINGS: Chronic hyperinflation and bronchial thickening. Normal heart size and mediastinal contours. No pulmonary edema, pneumothorax, or large pleural effusion. No focal airspace disease. Right humeral arthroplasty. Right elbow fractures partially included. There are remote bilateral rib fractures. No acute fracture is seen. IMPRESSION: Chronic hyperinflation and bronchial thickening may be due to asthma or COPD. No acute findings. Electronically Signed   By: Narda Rutherford M.D.   On: 10/29/2020 20:33     Scheduled Meds:  enoxaparin (LOVENOX) injection  40 mg Subcutaneous Q24H   folic acid  1 mg Oral Daily   lamoTRIgine  100 mg Oral BID   multivitamin with minerals  1 tablet Oral Daily   nicotine  14 mg Transdermal Daily   OLANZapine  2.5 mg Oral BID   pantoprazole  40 mg Oral Daily   propranolol  40 mg Oral BID   sodium chloride flush  3 mL Intravenous Q12H   thiamine  100 mg Oral Daily   Or  thiamine  100 mg Intravenous Daily   traZODone  150 mg Oral QHS   Continuous Infusions:   LOS: 0 days     Darlin Priestly, MD [facility] If 7PM-7AM, please contact night-coverage 10/30/2020, 6:05 PM

## 2020-10-30 NOTE — Evaluation (Signed)
Occupational Therapy Evaluation Patient Details Name: Ariel White MRN: [id number] DOB: 1961-01-20 Today's Date: 10/30/2020    History of Present Illness Per MD notes, pt is a 60 y.o. female who presented to the ED for evaluation of right elbow pain after a fall at home night of 10/27/2020.  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 MD assessment includes alcohol use disorder with withdrawal, acute R supracondylar distal humerus fx, and frequent falls.   Clinical Impression   Ariel White was seen for OT evaluation this date. Prior to hospital admission, pt was Independent for mobility and ADLs. Pt lives alone in mobile home c 3 STE. Pt presents to acute OT demonstrating impaired ADL performance and functional mobility 2/2 decreased activity tolerance, pain, and impaired functional use of dominant RUE. Pt currently requires MAX A adjust R sling in standing - pt limited by pain, asssit for RUE mgmt. CGA for ADL t/f. Anticpate MOD A for LBD. Pt would benefit from skilled OT to address noted impairments and functional limitations (see below for any additional details) in order to maximize safety and independence while minimizing falls risk and caregiver burden. Upon hospital discharge, recommend STR to maximize pt safety and return to PLOF.     Follow Up Recommendations  SNF    Equipment Recommendations  Other (comment) (TBD)    Recommendations for Other Services       Precautions / Restrictions Precautions Precautions: Fall Required Braces or Orthoses: Splint/Cast Splint/Cast: long arm splint to R UE Restrictions Weight Bearing Restrictions: Yes RUE Weight Bearing: Non weight bearing Other Position/Activity Restrictions: assumed NWB      Mobility Bed Mobility Overal bed mobility: Needs Assistance Bed Mobility: Supine to Sit     Supine to sit:  Supervision     General bed mobility comments: pt received and left in chair    Transfers Overall transfer level: Needs assistance   Transfers: Sit to/from Stand Sit to Stand: Min guard            Balance Overall balance assessment: Needs assistance Sitting-balance support: No upper extremity supported Sitting balance-Leahy Scale: Fair     Standing balance support: No upper extremity supported Standing balance-Leahy Scale: Fair                             ADL either performed or assessed with clinical judgement   ADL Overall ADL's : Needs assistance/impaired                                       General ADL Comments: MAX A adjust R sling in standing - pt limited by pain, asssit for RUE mgmt. CGA for ADL t/f. Anticpate MOD A for LBD      Pertinent Vitals/Pain Pain Assessment: 0-10 Pain Score: 7  Pain Location: R elbow Pain Descriptors / Indicators: Aching;Sore Pain Intervention(s): Limited activity within patient's tolerance;Repositioned     Hand Dominance Right   Extremity/Trunk Assessment Upper Extremity Assessment Upper Extremity Assessment: RUE deficits/detail RUE Deficits / Details: Limited by pain, demonstrates WFL finger flexion/extension, unable to mobilize wrist in splint. Pt refuses shoulder ROM citing pain, achieves scapular elevation/protraction WFL RUE: Unable to fully assess due to immobilization;Unable to fully assess due to pain   Lower Extremity Assessment Lower Extremity Assessment: Overall  WFL for tasks assessed   Cervical / Trunk Assessment Cervical / Trunk Assessment: Kyphotic   Communication Communication Communication: No difficulties   Cognition Arousal/Alertness: Lethargic Behavior During Therapy: WFL for tasks assessed/performed Overall Cognitive Status: Within Functional Limits for tasks assessed                                 General Comments: Pt lethargic initially falling asleep  intermittently during conversations   General Comments  R hand edema noted    Exercises Exercises: Other exercises Other Exercises Other Exercises: Pt educated re: OT role, DME recs, d/c recs, falls prevention, ECS Other Exercises: UBD< sit<>stand, sitting/standing balance/tolerance   Shoulder Instructions      Home Living Family/patient expects to be discharged to:: Private residence Living Arrangements: Alone Available Help at Discharge: Family;Friend(s);Available PRN/intermittently Type of Home: Mobile home Home Access: Stairs to enter Entrance Stairs-Number of Steps: 3 Entrance Stairs-Rails: Can reach both Home Layout: One level     Bathroom Shower/Tub: Chief Strategy Officer: Standard Bathroom Accessibility: Yes   Home Equipment: Cane - single point;Walker - 2 wheels;Bedside commode          Prior Functioning/Environment Level of Independence: Independent        Comments: Pt is independent with ambulation and ADLs with rest breaks intermittently. She avoids walking in grass because the ground is uneven. Pt reports 2-3 falls within the last year 2/2 LOB.        OT Problem List: Decreased strength;Decreased range of motion;Decreased activity tolerance;Impaired balance (sitting and/or standing);Decreased safety awareness;Pain;Impaired UE functional use      OT Treatment/Interventions: Self-care/ADL training;Therapeutic exercise;Energy conservation;DME and/or AE instruction;Therapeutic activities;Patient/family education;Balance training    OT Goals(Current goals can be found in the care plan section) Acute Rehab OT Goals Patient Stated Goal: Go back home OT Goal Formulation: With patient Time For Goal Achievement: 11/13/20 Potential to Achieve Goals: Good ADL Goals Pt Will Perform Upper Body Dressing: with mod assist;sitting Pt Will Perform Lower Body Dressing: with min assist;sit to/from stand Pt Will Transfer to Toilet: ambulating;regular height  toilet;Independently  OT Frequency: Min 1X/week   Barriers to D/C: Decreased caregiver support             AM-PAC OT "6 Clicks" Daily Activity     Outcome Measure Help from another person eating meals?: A Little Help from another person taking care of personal grooming?: A Little Help from another person toileting, which includes using toliet, bedpan, or urinal?: A Lot Help from another person bathing (including washing, rinsing, drying)?: A Lot Help from another person to put on and taking off regular upper body clothing?: A Lot Help from another person to put on and taking off regular lower body clothing?: A Lot 6 Click Score: 14   End of Session    Activity Tolerance: Patient tolerated treatment well Patient left: in chair;with call bell/phone within reach;with chair alarm set;with nursing/sitter in room  OT Visit Diagnosis: Unsteadiness on feet (R26.81);Repeated falls (R29.6)                Time: 1141-1149 OT Time Calculation (min): 8 min Charges:  OT General Charges $OT Visit: 1 Visit OT Evaluation $OT Eval Low Complexity: 1 Low  Kathie Dike, M.S. OTR/L  10/30/20, 2:09 PM  ascom [phone]

## 2020-10-31 LAB — CBC
HCT: 35.3 % — ABNORMAL LOW (ref 36.0–46.0)
Hemoglobin: 12.1 g/dL (ref 12.0–15.0)
MCH: 40.3 pg — ABNORMAL HIGH (ref 26.0–34.0)
MCHC: 34.3 g/dL (ref 30.0–36.0)
MCV: 117.7 fL — ABNORMAL HIGH (ref 80.0–100.0)
Platelets: 167 10*3/uL (ref 150–400)
RBC: 3 MIL/uL — ABNORMAL LOW (ref 3.87–5.11)
RDW: 13.2 % (ref 11.5–15.5)
WBC: 5.3 10*3/uL (ref 4.0–10.5)
nRBC: 0 % (ref 0.0–0.2)

## 2020-10-31 LAB — BASIC METABOLIC PANEL
Anion gap: 7 (ref 5–15)
BUN: 9 mg/dL (ref 6–20)
CO2: 25 mmol/L (ref 22–32)
Calcium: 8.6 mg/dL — ABNORMAL LOW (ref 8.9–10.3)
Chloride: 104 mmol/L (ref 98–111)
Creatinine, Ser: 0.55 mg/dL (ref 0.44–1.00)
GFR, Estimated: >60 mL/min (ref >60)
Glucose, Bld: 108 mg/dL — ABNORMAL HIGH (ref 70–99)
Potassium: 3.5 mmol/L (ref 3.5–5.1)
Sodium: 136 mmol/L (ref 135–145)

## 2020-10-31 LAB — MAGNESIUM: Magnesium: 1.9 mg/dL (ref 1.7–2.4)

## 2020-10-31 NOTE — Progress Notes (Signed)
PT Cancellation Note  Patient Details Name: Ariel White MRN: [id number] DOB: 11/04/1960   Cancelled Treatment:    Reason Eval/Treat Not Completed: Other (comment) Per RN: Pt continuing to present as impulsive, alternating from lethargic/drowsy to "jumping out of bed". Pt is very confused. CIWA continued today as pt is continuing with alcohol withdrawal.  Basilia Jumbo PT, DPT 10/31/20 2:17 PM [phone]  Ariel White 10/31/2020, 2:12 PM

## 2020-10-31 NOTE — Progress Notes (Signed)
PROGRESS NOTE    Ariel White  ZJI:[id number] DOB: Oct 14, 1960 DOA: 10/29/2020 PCP: Carren Rang, PA-C  [id number]   Assessment & Plan:   Principal Problem:   Alcohol abuse with withdrawal [facility]) Active Problems:   Frequent falls   Right supracondylar humerus fracture, closed, initial encounter   Falls frequently   Ariel White is a 60 y.o. 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   Patient reports chronic balance issues resulting in frequent falls which has been an ongoing issue since she had a brain tumor excision in 1985.  She says night of 10/27/2020 she was getting up to go use the restroom when she lost her balance and fell onto the ground.  She hit her right elbow.  She said she did hit part of her head but did not lose consciousness.   Alcohol use disorder with withdrawal: Admitted with concern for early alcohol withdrawal.  gave inconsistent alcohol use history. --cont CIWA -Continue thiamine, folate, MVM  Acute right supracondylar distal humerus fracture: after fall at home.  EDP discussed with on-call orthopedics who stated that patient can be set up with outpatient trauma surgery for eventual surgical fixation.  If patient remains in hospital more than 7 days then would need to transfer to [facility] for intervention. -Long-arm splint placed in ED --oxycodone 5 mg q4h PRN   Chronic balance issues and Frequent falls: Chronic related to prior brain tumor resection and exacerbated by alcohol use.   --PT/OT rec SNF   Bipolar disorder/depression/anxiety: On home Zyprexa, Lamictal, propranolol, trazodone. --cont home regimen   Tobacco use: Reports smoking 1/3 pack/day.   --cont nicotine patch   DVT  prophylaxis: Lovenox SQ Code Status: DNR  Family Communication:  Level of care: Med-Surg Dispo:   The patient is from: home Anticipated d/c is to: SNF Anticipated d/c date is: 1-2 days Patient currently is not medically ready to d/c due to: receiving ativan for alcohol withdrawal.    Subjective and Interval History:  Pt has been noted to be impulsive and trying to get out of bed.  Sitter ordered.  Pt said she needed to get up to urinate.  Pt said she drank lots water to flush out her kidneys.     Objective: Vitals:   10/30/20 1927 10/31/20 0508 10/31/20 0745 10/31/20 1541  BP: 126/73 138/79 130/77 115/62  Pulse: 90 (!) 103 96 88  Resp: 17 17 15 15   Temp: 98.5 F (36.9 C) 98.6 F (37 C) 97.8 F (36.6 C) 98.7 F (37.1 C)  TempSrc:      SpO2: 97% 93% 93% 95%  Weight:      Height:        Intake/Output Summary (Last 24 hours) at 10/31/2020 1719 Last data filed at 10/31/2020 1008 Gross per 24 hour  Intake 0 ml  Output --  Net 0 ml   Filed Weights   10/29/20 1634  Weight: 52.2 kg    Examination:   Constitutional: NAD, sleeping, but arousable HEENT: conjunctivae and lids normal, EOMI CV: No cyanosis.   RESP: normal respiratory effort, on RA Extremities: No effusions, edema in BLE, right elbow in sling SKIN: warm, dry Neuro: II - XII grossly intact.     Data Reviewed: I have personally reviewed following labs and  imaging studies  CBC: Recent Labs  Lab 10/29/20 1633 10/30/20 0657 10/31/20 0711  WBC 9.7 5.8 5.3  HGB 15.0 12.2 12.1  HCT 43.5 35.3* 35.3*  MCV 115.4* 118.1* 117.7*  PLT 248 186 167   Basic Metabolic Panel: Recent Labs  Lab 10/29/20 1633 10/30/20 0657 10/31/20 0711  NA 141 134* 136  K 3.8 3.3* 3.5  CL 111 102 104  CO2 15* 22 25  GLUCOSE 88 76 108*  BUN 21* 15 9  CREATININE 0.78 0.68 0.55  CALCIUM 8.8* 8.3* 8.6*  MG  --  1.5* 1.9  PHOS  --  2.7  --    GFR: Estimated Creatinine Clearance: 59.9 mL/min (by C-G formula based on SCr of  0.55 mg/dL). Liver Function Tests: Recent Labs  Lab 10/29/20 1900 10/30/20 0657  AST 42* 31  ALT 20 15  ALKPHOS 123 109  BILITOT 0.6 1.3*  PROT 7.8 6.2*  ALBUMIN 4.0 3.2*   Recent Labs  Lab 10/29/20 1900  LIPASE 46   Recent Labs  Lab 10/29/20 2243  AMMONIA 19   Coagulation Profile: Recent Labs  Lab 10/29/20 1900  INR 0.9   Cardiac Enzymes: No results for input(s): CKTOTAL, CKMB, CKMBINDEX, TROPONINI in the last 168 hours. BNP (last 3 results) No results for input(s): PROBNP in the last 8760 hours. HbA1C: No results for input(s): HGBA1C in the last 72 hours. CBG: No results for input(s): GLUCAP in the last 168 hours. Lipid Profile: No results for input(s): CHOL, HDL, LDLCALC, TRIG, CHOLHDL, LDLDIRECT in the last 72 hours. Thyroid Function Tests: No results for input(s): TSH, T4TOTAL, FREET4, T3FREE, THYROIDAB in the last 72 hours. Anemia Panel: No results for input(s): VITAMINB12, FOLATE, FERRITIN, TIBC, IRON, RETICCTPCT in the last 72 hours. Sepsis Labs: Recent Labs  Lab 10/29/20 1715 10/29/20 2243  LATICACIDVEN 2.5* 1.5    Recent Results (from the past 240 hour(s))  Resp Panel by RT-PCR (Flu A&B, Covid) Nasopharyngeal Swab     Status: None   Collection Time: 10/29/20 10:43 PM   Specimen: Nasopharyngeal Swab; Nasopharyngeal(NP) swabs in vial transport medium  Result Value Ref Range Status   SARS Coronavirus 2 by RT PCR NEGATIVE NEGATIVE Final    Comment: (NOTE) SARS-CoV-2 target nucleic acids are NOT DETECTED.  The SARS-CoV-2 RNA is generally detectable in upper respiratory specimens during the acute phase of infection. The lowest concentration of SARS-CoV-2 viral copies this assay can detect is 138 copies/mL. A negative result does not preclude SARS-Cov-2 infection and should not be used as the sole basis for treatment or other patient management decisions. A negative result may occur with  improper specimen collection/handling, submission of  specimen other than nasopharyngeal swab, presence of viral mutation(s) within the areas targeted by this assay, and inadequate number of viral copies(<138 copies/mL). A negative result must be combined with clinical observations, patient history, and epidemiological information. The expected result is Negative.  Fact Sheet for Patients:  BloggerCourse.com  Fact Sheet for Healthcare Providers:  SeriousBroker.it  This test is no t yet approved or cleared by the [place] FDA and  has been authorized for detection and/or diagnosis of SARS-CoV-2 by FDA under an Emergency Use Authorization (EUA). This EUA will remain  in effect (meaning this test can be used) for the duration of the COVID-19 declaration under Section 564(b)(1) of the Act, 21 U.S.C.section 360bbb-3(b)(1), unless the authorization is terminated  or revoked sooner.       Influenza A by PCR NEGATIVE NEGATIVE Final  Influenza B by PCR NEGATIVE NEGATIVE Final    Comment: (NOTE) The Xpert Xpress SARS-CoV-2/FLU/RSV plus assay is intended as an aid in the diagnosis of influenza from Nasopharyngeal swab specimens and should not be used as a sole basis for treatment. Nasal washings and aspirates are unacceptable for Xpert Xpress SARS-CoV-2/FLU/RSV testing.  Fact Sheet for Patients: BloggerCourse.com  Fact Sheet for Healthcare Providers: SeriousBroker.it  This test is not yet approved or cleared by the [place] FDA and has been authorized for detection and/or diagnosis of SARS-CoV-2 by FDA under an Emergency Use Authorization (EUA). This EUA will remain in effect (meaning this test can be used) for the duration of the COVID-19 declaration under Section 564(b)(1) of the Act, 21 U.S.C. section 360bbb-3(b)(1), unless the authorization is terminated or revoked.  Performed at [facility], [address] [place], [place] [address]       Radiology Studies: DG Elbow Complete Right  Result Date: 10/29/2020 CLINICAL DATA:  Fall with elbow pain EXAM: RIGHT ELBOW - COMPLETE 3+ VIEW COMPARISON:  01/14/2019 FINDINGS: Positive for elbow effusion. No radial head dislocation. Acute mildly displaced fracture involving the supracondylar humerus superimposed on underlying chronic fracture deformity. IMPRESSION: Acute mildly displaced supracondylar humerus fracture superimposed on underlying chronic fracture deformity. Associated elbow effusion. Electronically Signed   By: Jasmine Pang M.D.   On: 10/29/2020 18:23   CT Head Wo Contrast  Result Date: 10/29/2020 CLINICAL DATA:  Larey Seat during the night on Monday. Multiple falls recently. Midline neck tenderness. EXAM: CT HEAD WITHOUT CONTRAST CT CERVICAL SPINE WITHOUT CONTRAST TECHNIQUE: Multidetector CT imaging of the head and cervical spine was performed following the standard protocol without intravenous contrast. Multiplanar CT image reconstructions of the cervical spine were also generated. COMPARISON:  01/14/2019 FINDINGS: CT HEAD FINDINGS Brain: No evidence of acute infarction, hemorrhage, hydrocephalus, extra-axial collection or mass lesion/mass effect. Mild diffuse cerebral atrophy. Patchy low-attenuation changes in the deep white matter consistent with small vessel ischemia. Vascular: Moderate intracranial arterial vascular calcifications. Skull: Postoperative changes in the midline occipital bone. Small craniostomy in the right posterior parietal region. No acute depressed skull fractures identified. Sinuses/Orbits: Paranasal sinuses and mastoid air cells are clear. Other: No significant change since prior study. CT CERVICAL SPINE FINDINGS Alignment: Reversal of the usual cervical lordosis. Mild retrolisthesis of C4 on C5 and mild anterolisthesis of C6 on C7. Alignment is unchanged since prior study, likely degenerative. Normal alignment of the facet joints. C1-2  articulation appears intact. Skull base and vertebrae: Postoperative changes in the posterior occipital bone with occipital craniotomy and packing material. No change since prior study. No vertebral compression deformities. No focal bone lesion or bone destruction. Soft tissues and spinal canal: No prevertebral soft tissue swelling. No abnormal paraspinal soft tissue mass or infiltration. Disc levels: Degenerative changes throughout with narrowed interspaces and endplate osteophyte formation. Degenerative changes in the facet joints. Upper chest: Lung apices are clear. Other: None. IMPRESSION: 1. No acute intracranial abnormalities. Chronic atrophy and small vessel ischemic changes. Postoperative changes in the skull base. 2. Alignment of the cervical spine is unchanged since prior study consistent with degenerative change. No acute displaced fractures identified. Electronically Signed   By: Burman Nieves M.D.   On: 10/29/2020 20:55   CT Cervical Spine Wo Contrast  Result Date: 10/29/2020 CLINICAL DATA:  Larey Seat during the night on Monday. Multiple falls recently. Midline neck tenderness. EXAM: CT HEAD WITHOUT CONTRAST CT CERVICAL SPINE WITHOUT CONTRAST TECHNIQUE: Multidetector CT imaging of the head and cervical  spine was performed following the standard protocol without intravenous contrast. Multiplanar CT image reconstructions of the cervical spine were also generated. COMPARISON:  01/14/2019 FINDINGS: CT HEAD FINDINGS Brain: No evidence of acute infarction, hemorrhage, hydrocephalus, extra-axial collection or mass lesion/mass effect. Mild diffuse cerebral atrophy. Patchy low-attenuation changes in the deep white matter consistent with small vessel ischemia. Vascular: Moderate intracranial arterial vascular calcifications. Skull: Postoperative changes in the midline occipital bone. Small craniostomy in the right posterior parietal region. No acute depressed skull fractures identified. Sinuses/Orbits: Paranasal  sinuses and mastoid air cells are clear. Other: No significant change since prior study. CT CERVICAL SPINE FINDINGS Alignment: Reversal of the usual cervical lordosis. Mild retrolisthesis of C4 on C5 and mild anterolisthesis of C6 on C7. Alignment is unchanged since prior study, likely degenerative. Normal alignment of the facet joints. C1-2 articulation appears intact. Skull base and vertebrae: Postoperative changes in the posterior occipital bone with occipital craniotomy and packing material. No change since prior study. No vertebral compression deformities. No focal bone lesion or bone destruction. Soft tissues and spinal canal: No prevertebral soft tissue swelling. No abnormal paraspinal soft tissue mass or infiltration. Disc levels: Degenerative changes throughout with narrowed interspaces and endplate osteophyte formation. Degenerative changes in the facet joints. Upper chest: Lung apices are clear. Other: None. IMPRESSION: 1. No acute intracranial abnormalities. Chronic atrophy and small vessel ischemic changes. Postoperative changes in the skull base. 2. Alignment of the cervical spine is unchanged since prior study consistent with degenerative change. No acute displaced fractures identified. Electronically Signed   By: Burman NievesWilliam  Stevens M.D.   On: 10/29/2020 20:55   CT Elbow Right Wo Contrast  Result Date: 10/29/2020 CLINICAL DATA:  Fracture of elbow. EXAM: CT OF THE LOWER RIGHT EXTREMITY WITHOUT CONTRAST TECHNIQUE: Multidetector CT imaging of the right lower extremity was performed according to the standard protocol. COMPARISON:  Plain film radiographs of the right elbow performed earlier today. FINDINGS: Bones/Joint/Cartilage Acute supracondylar fracture involving the distal humerus is identified. The fracture fragments are in near anatomic alignment. No signs of dislocation. Remote deformity involving the distal diaphysis of the humerus is again seen. No additional fractures or dislocation identified  Ligaments Suboptimally assessed by CT. Muscles and Tendons Unremarkable. Soft tissues Small joint effusion. IMPRESSION: Acute supracondylar fracture involving the distal humerus is identified. The fracture fragments are in near anatomic alignment. Remote distal diaphyseal fracture of the distal humerus. Electronically Signed   By: Signa Kellaylor  Stroud M.D.   On: 10/29/2020 21:12   DG Chest Portable 1 View  Result Date: 10/29/2020 CLINICAL DATA:  Weakness.  Fall. EXAM: PORTABLE CHEST 1 VIEW COMPARISON:  01/14/2019 FINDINGS: Chronic hyperinflation and bronchial thickening. Normal heart size and mediastinal contours. No pulmonary edema, pneumothorax, or large pleural effusion. No focal airspace disease. Right humeral arthroplasty. Right elbow fractures partially included. There are remote bilateral rib fractures. No acute fracture is seen. IMPRESSION: Chronic hyperinflation and bronchial thickening may be due to asthma or COPD. No acute findings. Electronically Signed   By: Narda RutherfordMelanie  Sanford M.D.   On: 10/29/2020 20:33     Scheduled Meds:  enoxaparin (LOVENOX) injection  40 mg Subcutaneous Q24H   folic acid  1 mg Oral Daily   lamoTRIgine  100 mg Oral BID   multivitamin with minerals  1 tablet Oral Daily   nicotine  14 mg Transdermal Daily   OLANZapine  2.5 mg Oral BID   pantoprazole  40 mg Oral Daily   propranolol  40 mg Oral BID  sodium chloride flush  3 mL Intravenous Q12H   thiamine  100 mg Oral Daily   Or   thiamine  100 mg Intravenous Daily   traZODone  150 mg Oral QHS   Continuous Infusions:   LOS: 1 day     Darlin Priestly, MD [facility] If 7PM-7AM, please contact night-coverage 10/31/2020, 5:19 PM

## 2020-10-31 NOTE — Plan of Care (Signed)

## 2020-11-01 NOTE — Progress Notes (Signed)
PROGRESS NOTE    Ariel White  XMD:[id number] DOB: July 30, 1960 DOA: 10/29/2020 PCP: Carren Rang, PA-C  143A/143A-AA   Assessment & Plan:   Principal Problem:   Alcohol abuse with withdrawal [facility]) Active Problems:   Frequent falls   Right supracondylar humerus fracture, closed, initial encounter   Falls frequently   Ariel White is a 60 y.o. 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   Patient reports chronic balance issues resulting in frequent falls which has been an ongoing issue since she had a brain tumor excision in 1985.  She says night of 10/27/2020 she was getting up to go use the restroom when she lost her balance and fell onto the ground.  She hit her right elbow.  She said she did hit part of her head but did not lose consciousness.   Alcohol use disorder with withdrawal: Admitted with concern for early alcohol withdrawal.  gave inconsistent alcohol use history. --cont CIWA -Continue thiamine, folate, MVM  Acute right supracondylar distal humerus fracture: after fall at home.  EDP discussed with on-call orthopedics who stated that patient can be set up with outpatient trauma surgery for eventual surgical fixation.  If patient remains in hospital more than 7 days then would need to transfer to [facility] for intervention. -Long-arm splint placed in ED --oxycodone 5 mg q4h PRN   Chronic balance issues and Frequent falls: Chronic related to prior brain tumor resection and exacerbated by alcohol use.   --PT/OT rec SNF   Bipolar disorder/depression/anxiety: On home Zyprexa, Lamictal, propranolol, trazodone. --cont home regimen   Tobacco use: Reports smoking 1/3 pack/day.   --cont nicotine patch   DVT  prophylaxis: Lovenox SQ Code Status: DNR  Family Communication:  Level of care: Med-Surg Dispo:   The patient is from: home Anticipated d/c is to: SNF Anticipated d/c date is: whenever bed available Patient currently is medically ready to d/c.   Subjective and Interval History:  Pt reported doing better, less pain in her elbow.  Has been getting up to bedside commode with help.   Objective: Vitals:   11/01/20 0424 11/01/20 0745 11/01/20 1226 11/01/20 1555  BP: 124/69 (!) 108/59 101/74 100/62  Pulse: 94 83 77 78  Resp: 16 18 14 17   Temp: 98.1 F (36.7 C) 99.3 F (37.4 C) 98.2 F (36.8 C) 98.2 F (36.8 C)  TempSrc:    Oral  SpO2: 90% 90% 95% 96%  Weight:      Height:        Intake/Output Summary (Last 24 hours) at 11/01/2020 1720 Last data filed at 11/01/2020 1337 Gross per 24 hour  Intake 240 ml  Output --  Net 240 ml   Filed Weights   10/29/20 1634  Weight: 52.2 kg    Examination:   Constitutional: NAD, AAOx3 HEENT: conjunctivae and lids normal, EOMI CV: No cyanosis.   RESP: normal respiratory effort, on RA Extremities: No effusions, edema in BLE, right elbow in sling. SKIN: warm, dry Neuro: II - XII grossly intact.   Psych: depressed mood and affect.     Data Reviewed: I have personally reviewed following labs and imaging studies  CBC: Recent Labs  Lab 10/29/20 1633 10/30/20 0657 10/31/20 0711  WBC 9.7 5.8 5.3  HGB 15.0 12.2 12.1  HCT 43.5 35.3* 35.3*  MCV 115.4* 118.1* 117.7*  PLT 248 186 167   Basic Metabolic Panel: Recent Labs  Lab 10/29/20 1633 10/30/20 0657 10/31/20 0711  NA 141 134* 136  K 3.8 3.3* 3.5  CL 111 102 104  CO2 15* 22 25  GLUCOSE 88 76 108*  BUN 21* 15 9  CREATININE 0.78 0.68 0.55  CALCIUM 8.8* 8.3* 8.6*  MG  --  1.5* 1.9  PHOS  --  2.7  --    GFR: Estimated Creatinine Clearance: 59.9 mL/min (by C-G formula based on SCr of 0.55 mg/dL). Liver Function Tests: Recent Labs  Lab 10/29/20 1900 10/30/20 0657  AST  42* 31  ALT 20 15  ALKPHOS 123 109  BILITOT 0.6 1.3*  PROT 7.8 6.2*  ALBUMIN 4.0 3.2*   Recent Labs  Lab 10/29/20 1900  LIPASE 46   Recent Labs  Lab 10/29/20 2243  AMMONIA 19   Coagulation Profile: Recent Labs  Lab 10/29/20 1900  INR 0.9   Cardiac Enzymes: No results for input(s): CKTOTAL, CKMB, CKMBINDEX, TROPONINI in the last 168 hours. BNP (last 3 results) No results for input(s): PROBNP in the last 8760 hours. HbA1C: No results for input(s): HGBA1C in the last 72 hours. CBG: No results for input(s): GLUCAP in the last 168 hours. Lipid Profile: No results for input(s): CHOL, HDL, LDLCALC, TRIG, CHOLHDL, LDLDIRECT in the last 72 hours. Thyroid Function Tests: No results for input(s): TSH, T4TOTAL, FREET4, T3FREE, THYROIDAB in the last 72 hours. Anemia Panel: No results for input(s): VITAMINB12, FOLATE, FERRITIN, TIBC, IRON, RETICCTPCT in the last 72 hours. Sepsis Labs: Recent Labs  Lab 10/29/20 1715 10/29/20 2243  LATICACIDVEN 2.5* 1.5    Recent Results (from the past 240 hour(s))  Resp Panel by RT-PCR (Flu A&B, Covid) Nasopharyngeal Swab     Status: None   Collection Time: 10/29/20 10:43 PM   Specimen: Nasopharyngeal Swab; Nasopharyngeal(NP) swabs in vial transport medium  Result Value Ref Range Status   SARS Coronavirus 2 by RT PCR NEGATIVE NEGATIVE Final    Comment: (NOTE) SARS-CoV-2 target nucleic acids are NOT DETECTED.  The SARS-CoV-2 RNA is generally detectable in upper respiratory specimens during the acute phase of infection. The lowest concentration of SARS-CoV-2 viral copies this assay can detect is 138 copies/mL. A negative result does not preclude SARS-Cov-2 infection and should not be used as the sole basis for treatment or other patient management decisions. A negative result may occur with  improper specimen collection/handling, submission of specimen other than nasopharyngeal swab, presence of viral mutation(s) within the areas targeted  by this assay, and inadequate number of viral copies(<138 copies/mL). A negative result must be combined with clinical observations, patient history, and epidemiological information. The expected result is Negative.  Fact Sheet for Patients:  BloggerCourse.com  Fact Sheet for Healthcare Providers:  SeriousBroker.it  This test is no t yet approved or cleared by the [place] FDA and  has been authorized for detection and/or diagnosis of SARS-CoV-2 by FDA under an Emergency Use Authorization (EUA). This EUA will remain  in effect (meaning this test can be used) for the duration of the COVID-19 declaration under Section 564(b)(1) of the Act, 21 U.S.C.section 360bbb-3(b)(1), unless the authorization is terminated  or revoked sooner.       Influenza A by PCR NEGATIVE NEGATIVE Final   Influenza B by PCR NEGATIVE NEGATIVE Final    Comment: (NOTE) The Xpert Xpress SARS-CoV-2/FLU/RSV plus assay is intended as an aid in  the diagnosis of influenza from Nasopharyngeal swab specimens and should not be used as a sole basis for treatment. Nasal washings and aspirates are unacceptable for Xpert Xpress SARS-CoV-2/FLU/RSV testing.  Fact Sheet for Patients: BloggerCourse.com  Fact Sheet for Healthcare Providers: SeriousBroker.it  This test is not yet approved or cleared by the [place] FDA and has been authorized for detection and/or diagnosis of SARS-CoV-2 by FDA under an Emergency Use Authorization (EUA). This EUA will remain in effect (meaning this test can be used) for the duration of the COVID-19 declaration under Section 564(b)(1) of the Act, 21 U.S.C. section 360bbb-3(b)(1), unless the authorization is terminated or revoked.  Performed at [facility], [address] [place], [place] [address]       Radiology Studies: No results found.   Scheduled Meds:   enoxaparin (LOVENOX) injection  40 mg Subcutaneous Q24H   folic acid  1 mg Oral Daily   lamoTRIgine  100 mg Oral BID   multivitamin with minerals  1 tablet Oral Daily   nicotine  14 mg Transdermal Daily   OLANZapine  2.5 mg Oral BID   pantoprazole  40 mg Oral Daily   propranolol  40 mg Oral BID   sodium chloride flush  3 mL Intravenous Q12H   thiamine  100 mg Oral Daily   Or   thiamine  100 mg Intravenous Daily   traZODone  150 mg Oral QHS   Continuous Infusions:   LOS: 2 days     Darlin Priestly, MD [facility] If 7PM-7AM, please contact night-coverage 11/01/2020, 5:20 PM

## 2020-11-01 NOTE — Progress Notes (Signed)
Physical Therapy Treatment Patient Details Name: Ariel White MRN: [id number] DOB: 09-02-1960 Today's Date: 11/01/2020    History of Present Illness Per MD notes, pt is a 60 y.o. female who presented to the ED for evaluation of right elbow pain after a fall at home night of 10/27/2020.  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 MD assessment includes alcohol use disorder with withdrawal, acute R supracondylar distal humerus fx, and frequent falls.    PT Comments    Pt was asleep in long sitting. She awakes with gentle touch but remains extremely lethargic throughout session. Constant vcs for keeping eyes open and full participation. She was able to exit bed, stand, and ambulate with LUE HHA +1. Continues to endorse RUE pain.  She fatigued quickly and once repositioned back in bed falls asleep. PT will continue to follow per current POC. Highly recommend DC to SNF to assist pt to PLOF. Pt lives alone and currently unable to perform ADLs independently.   Follow Up Recommendations  SNF;Supervision/Assistance - 24 hour     Equipment Recommendations  None recommended by PT       Precautions / Restrictions Precautions Precautions: Fall Required Braces or Orthoses: Splint/Cast Splint/Cast: long arm splint to R UE Restrictions Weight Bearing Restrictions: Yes RUE Weight Bearing: Non weight bearing    Mobility  Bed Mobility Overal bed mobility: Needs Assistance Bed Mobility: Supine to Sit     Supine to sit: Min assist     General bed mobility comments: min assist to exit bed with HOB elevated and use of bedrail with LUE    Transfers Overall transfer level: Needs assistance Equipment used: 1 person hand held assist Transfers: Sit to/from Stand Sit to Stand: Min guard;From elevated surface         General transfer comment: HHA + 1 LUE. Pt  cued for fwd wt shift.  Ambulation/Gait Ambulation/Gait assistance: Min guard Gait Distance (Feet): 25 Feet Assistive device: 1 person hand held assist Gait Pattern/deviations: Decreased step length - right;Decreased step length - left;Decreased stride length;Wide base of support;Step-through pattern Gait velocity: decreased   General Gait Details: Pt was able to ambulate 25 ft with + 1 LUE HHA. pt very lethargic. Lethargy mostly impacts session progress. fatigued very quickly       Balance Overall balance assessment: Needs assistance Sitting-balance support: No upper extremity supported Sitting balance-Leahy Scale: Fair     Standing balance support: No upper extremity supported Standing balance-Leahy Scale: Fair          Cognition Arousal/Alertness: Lethargic Behavior During Therapy: Flat affect Overall Cognitive Status: Within Functional Limits for tasks assessed      General Comments: pt was extremely lethargic throughout session. needs constant vcs for keeping eyes open and full participation             Pertinent Vitals/Pain Pain Assessment: 0-10 Pain Score:  (Did not rate." Hurts alot.") Pain Location: R elbow Pain Descriptors / Indicators: Aching;Sore Pain Intervention(s): Limited activity within patient's tolerance;Premedicated before session;Monitored during session;Repositioned     PT Goals (current goals can now be found in the care plan section) Acute Rehab PT Goals Patient Stated Goal: Go back home Progress towards PT goals: Progressing toward goals    Frequency    7X/week      PT Plan Current plan remains appropriate       AM-PAC PT "6 Clicks" Mobility   Outcome Measure  Help needed turning from your back to your side while in a flat bed without using bedrails?: A Little Help needed moving from lying on your back to sitting on the side of a flat bed without using bedrails?: A Little Help needed moving to and from a bed to a chair  (including a wheelchair)?: A Little Help needed standing up from a chair using your arms (e.g., wheelchair or bedside chair)?: A Little Help needed to walk in hospital room?: A Little Help needed climbing 3-5 steps with a railing? : A Lot 6 Click Score: 17    End of Session Equipment Utilized During Treatment: Gait belt Activity Tolerance: Patient limited by lethargy Patient left: in bed;with call bell/phone within reach;with bed alarm set Nurse Communication: Mobility status PT Visit Diagnosis: Unsteadiness on feet (R26.81);History of falling (Z91.81);Muscle weakness (generalized) (M62.81);Pain Pain - Right/Left: Right Pain - part of body: Arm     Time: 4034-7425 PT Time Calculation (min) (ACUTE ONLY): 17 min  Charges:  $Gait Training: 8-22 mins                     Jetta Lout PTA 11/01/20, 11:41 AM

## 2020-11-01 NOTE — TOC Progression Note (Signed)
Transition of Care [facility]) - Progression Note    Patient Details  Name: Ariel White MRN: [id number] Date of Birth: Jun 07, 1960  Transition of Care [facility]) CM/SW Contact  Bing Quarry, RN Phone Number: 11/01/2020, 1:49 PM  Clinical Narrative:    7/9: [facility] and [facility] have declined and the rest remain pending at 150 pm 11/01/20.        Expected Discharge Plan and Services                                                 Social Determinants of Health (SDOH) Interventions    Readmission Risk Interventions No flowsheet data found.

## 2020-11-02 MED ORDER — HYDROXYZINE HCL 25 MG PO TABS
25.0000 mg | ORAL_TABLET | Freq: Three times a day (TID) | ORAL | Status: DC | PRN
  Administered 2020-11-04 – 2020-11-05 (×2): 25 mg via ORAL
  Filled 2020-11-02 (×3): qty 1

## 2020-11-02 NOTE — Progress Notes (Signed)
PT Cancellation Note  Patient Details Name: Ariel White MRN: [id number] DOB: 11-Jun-1960   Cancelled Treatment:    Reason Eval/Treat Not Completed: Other (comment)  Offered x 2 this AM before and after breakfast.  Flatly refused x 2 and conveyed she is not interested in therapy today.  Will continue tomorrow.   Danielle Dess 11/02/2020, 10:05 AM

## 2020-11-02 NOTE — Progress Notes (Signed)
PROGRESS NOTE    Ariel White  WGN:[id number] DOB: [birth year] DOA: 10/29/2020 PCP: Carren Rang, PA-C  143A/143A-AA   Assessment & Plan:   Principal Problem:   Alcohol abuse with withdrawal [facility]) Active Problems:   Frequent falls   Right supracondylar humerus fracture, closed, initial encounter   Falls frequently   Ariel White is a 60 y.o. 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   Patient reports chronic balance issues resulting in frequent falls which has been an ongoing issue since she had a brain tumor excision in 1985.  She says night of 10/27/2020 she was getting up to go use the restroom when she lost her balance and fell onto the ground.  She hit her right elbow.  She said she did hit part of her head but did not lose consciousness.   Alcohol use disorder with withdrawal: Admitted with concern for early alcohol withdrawal.  gave inconsistent alcohol use history. --finished CIWA monitor -Continue thiamine, folate, MVM  Acute right supracondylar distal humerus fracture: after fall at home.  EDP discussed with on-call orthopedics who stated that patient can be set up with outpatient trauma surgery for eventual surgical fixation.  If patient remains in hospital more than 7 days then would need to transfer to [facility] for intervention. -Long-arm splint placed in ED --oxycodone 5 mg q4h PRN   Chronic balance issues and Frequent falls: Chronic related to prior brain tumor resection and exacerbated by alcohol use.   --PT/OT rec SNF   Bipolar disorder/depression/anxiety: On home Zyprexa, Lamictal, propranolol, trazodone. --cont home regimen --Atarax PRN   Tobacco use: Reports smoking 1/3 pack/day.    --cont nicotine patch   DVT prophylaxis: Lovenox SQ Code Status: DNR  Family Communication:  Level of care: Med-Surg Dispo:   The patient is from: home Anticipated d/c is to: SNF Anticipated d/c date is: whenever bed available Patient currently is medically ready to d/c.   Subjective and Interval History:  RN noted pt to be anxious.  Pt was emotionally labile, cried easily.  Complained mostly of arm pain.  Refused PT x2 today because she felt bad.  Ate her meal.   Objective: Vitals:   11/01/20 2003 11/01/20 2130 11/02/20 0553 11/02/20 0828  BP: 117/67  128/71 124/78  Pulse: 84  83 94  Resp: 16  16 14   Temp: 98.7 F (37.1 C)  98 F (36.7 C) 97.8 F (36.6 C)  TempSrc: Oral  Oral   SpO2: 90% 91% 90% 94%  Weight:      Height:        Intake/Output Summary (Last 24 hours) at 11/02/2020 1424 Last data filed at 11/02/2020 1343 Gross per 24 hour  Intake 120 ml  Output --  Net 120 ml   Filed Weights   10/29/20 1634  Weight: 52.2 kg    Examination:   Constitutional: NAD, AAOx3 HEENT: conjunctivae and lids normal, EOMI CV: No cyanosis.   RESP: normal respiratory effort, on RA Extremities: No effusions, edema in BLE, right elbow in sling SKIN: warm, dry Neuro: II - XII grossly intact.   Psych: labile mood and affect.     Data Reviewed: I have personally reviewed following labs and imaging studies  CBC: Recent Labs  Lab 10/29/20 1633 10/30/20 0657 10/31/20  0711  WBC 9.7 5.8 5.3  HGB 15.0 12.2 12.1  HCT 43.5 35.3* 35.3*  MCV 115.4* 118.1* 117.7*  PLT 248 186 167   Basic Metabolic Panel: Recent Labs  Lab 10/29/20 1633 10/30/20 0657 10/31/20 0711  NA 141 134* 136  K 3.8 3.3* 3.5  CL 111 102 104  CO2 15* 22 25  GLUCOSE 88 76 108*  BUN 21* 15 9  CREATININE 0.78 0.68 0.55  CALCIUM 8.8* 8.3* 8.6*  MG  --  1.5* 1.9  PHOS  --  2.7  --    GFR: Estimated Creatinine Clearance: 59.9 mL/min (by C-G formula based on SCr of 0.55 mg/dL). Liver Function  Tests: Recent Labs  Lab 10/29/20 1900 10/30/20 0657  AST 42* 31  ALT 20 15  ALKPHOS 123 109  BILITOT 0.6 1.3*  PROT 7.8 6.2*  ALBUMIN 4.0 3.2*   Recent Labs  Lab 10/29/20 1900  LIPASE 46   Recent Labs  Lab 10/29/20 2243  AMMONIA 19   Coagulation Profile: Recent Labs  Lab 10/29/20 1900  INR 0.9   Cardiac Enzymes: No results for input(s): CKTOTAL, CKMB, CKMBINDEX, TROPONINI in the last 168 hours. BNP (last 3 results) No results for input(s): PROBNP in the last 8760 hours. HbA1C: No results for input(s): HGBA1C in the last 72 hours. CBG: No results for input(s): GLUCAP in the last 168 hours. Lipid Profile: No results for input(s): CHOL, HDL, LDLCALC, TRIG, CHOLHDL, LDLDIRECT in the last 72 hours. Thyroid Function Tests: No results for input(s): TSH, T4TOTAL, FREET4, T3FREE, THYROIDAB in the last 72 hours. Anemia Panel: No results for input(s): VITAMINB12, FOLATE, FERRITIN, TIBC, IRON, RETICCTPCT in the last 72 hours. Sepsis Labs: Recent Labs  Lab 10/29/20 1715 10/29/20 2243  LATICACIDVEN 2.5* 1.5    Recent Results (from the past 240 hour(s))  Resp Panel by RT-PCR (Flu A&B, Covid) Nasopharyngeal Swab     Status: None   Collection Time: 10/29/20 10:43 PM   Specimen: Nasopharyngeal Swab; Nasopharyngeal(NP) swabs in vial transport medium  Result Value Ref Range Status   SARS Coronavirus 2 by RT PCR NEGATIVE NEGATIVE Final    Comment: (NOTE) SARS-CoV-2 target nucleic acids are NOT DETECTED.  The SARS-CoV-2 RNA is generally detectable in upper respiratory specimens during the acute phase of infection. The lowest concentration of SARS-CoV-2 viral copies this assay can detect is 138 copies/mL. A negative result does not preclude SARS-Cov-2 infection and should not be used as the sole basis for treatment or other patient management decisions. A negative result may occur with  improper specimen collection/handling, submission of specimen other than nasopharyngeal  swab, presence of viral mutation(s) within the areas targeted by this assay, and inadequate number of viral copies(<138 copies/mL). A negative result must be combined with clinical observations, patient history, and epidemiological information. The expected result is Negative.  Fact Sheet for Patients:  BloggerCourse.com  Fact Sheet for Healthcare Providers:  SeriousBroker.it  This test is no t yet approved or cleared by the [place] FDA and  has been authorized for detection and/or diagnosis of SARS-CoV-2 by FDA under an Emergency Use Authorization (EUA). This EUA will remain  in effect (meaning this test can be used) for the duration of the COVID-19 declaration under Section 564(b)(1) of the Act, 21 U.S.C.section 360bbb-3(b)(1), unless the authorization is terminated  or revoked sooner.       Influenza A by PCR NEGATIVE NEGATIVE Final   Influenza B by PCR NEGATIVE NEGATIVE Final    Comment: (NOTE)  The Xpert Xpress SARS-CoV-2/FLU/RSV plus assay is intended as an aid in the diagnosis of influenza from Nasopharyngeal swab specimens and should not be used as a sole basis for treatment. Nasal washings and aspirates are unacceptable for Xpert Xpress SARS-CoV-2/FLU/RSV testing.  Fact Sheet for Patients: BloggerCourse.com  Fact Sheet for Healthcare Providers: SeriousBroker.it  This test is not yet approved or cleared by the [place] FDA and has been authorized for detection and/or diagnosis of SARS-CoV-2 by FDA under an Emergency Use Authorization (EUA). This EUA will remain in effect (meaning this test can be used) for the duration of the COVID-19 declaration under Section 564(b)(1) of the Act, 21 U.S.C. section 360bbb-3(b)(1), unless the authorization is terminated or revoked.  Performed at [facility], [address], [place], [place] [address]        Radiology Studies: No results found.   Scheduled Meds:  enoxaparin (LOVENOX) injection  40 mg Subcutaneous Q24H   folic acid  1 mg Oral Daily   lamoTRIgine  100 mg Oral BID   multivitamin with minerals  1 tablet Oral Daily   nicotine  14 mg Transdermal Daily   OLANZapine  2.5 mg Oral BID   pantoprazole  40 mg Oral Daily   propranolol  40 mg Oral BID   sodium chloride flush  3 mL Intravenous Q12H   thiamine  100 mg Oral Daily   Or   thiamine  100 mg Intravenous Daily   traZODone  150 mg Oral QHS   Continuous Infusions:   LOS: 3 days     Darlin Priestly, MD [facility] If 7PM-7AM, please contact night-coverage 11/02/2020, 2:24 PM

## 2020-11-02 NOTE — TOC Progression Note (Signed)
Transition of Care [facility]) - Progression Note    Patient Details  Name: Ariel White MRN: [id number] Date of Birth: 11-24-1960  Transition [facility]) CM/SW Contact  Bing Quarry, RN Phone Number: 11/02/2020, 5:01 PM  Clinical Narrative: Bed offers still pending. Two declines, no accepts. Gabriel Cirri RN CM            Expected Discharge Plan and Services                                                 Social Determinants of Health (SDOH) Interventions    Readmission Risk Interventions No flowsheet data found.

## 2020-11-03 NOTE — TOC Progression Note (Signed)
Transition of Care [facility]) - Progression Note    Patient Details  Name: Ariel White MRN: [id number] Date of Birth: July 01, 1960  Transition of Care [facility]) CM/SW [place], RN Phone Number: 11/03/2020, 1:39 PM  Clinical Narrative:     Met with the patient and discussed Bed offer, she accepted the bed offer from [facility], I notified Tammy, Uploaded clinical documents to [facility] thru the hub, awaiting approval       Expected Discharge Plan and Services                                                 Social Determinants of Health (SDOH) Interventions    Readmission Risk Interventions No flowsheet data found.

## 2020-11-03 NOTE — Progress Notes (Signed)
PROGRESS NOTE    Ariel White  KZS:[id number] DOB: 1960-05-01 DOA: 10/29/2020 PCP: Carren Rang, PA-C  [id number]/[id number]-AA   Assessment & Plan:   Principal Problem:   Alcohol abuse with withdrawal [facility]) Active Problems:   Frequent falls   Right supracondylar humerus fracture, closed, initial encounter   Falls frequently   Ariel White is a 60 y.o. 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   Patient reports chronic balance issues resulting in frequent falls which has been an ongoing issue since she had a brain tumor excision in 1985.  She says night of 10/27/2020 she was getting up to go use the restroom when she lost her balance and fell onto the ground.  She hit her right elbow.  She said she did hit part of her head but did not lose consciousness.   Alcohol use disorder with withdrawal: Admitted with concern for early alcohol withdrawal.  gave inconsistent alcohol use history. --finished CIWA monitor -Continue thiamine, folate, MVM  Acute right supracondylar distal humerus fracture: after fall at home.  EDP discussed with on-call orthopedics who stated that patient can be set up with outpatient trauma surgery for eventual surgical fixation.  If patient remains in hospital more than 7 days then would need to transfer to [facility] for intervention. -Long-arm splint placed in ED --oxycodone 5 mg q4h PRN   Chronic balance issues and Frequent falls: Chronic related to prior brain tumor resection and exacerbated by alcohol use.   --PT/OT rec SNF   Bipolar disorder/depression/anxiety: On home Zyprexa, Lamictal, propranolol, trazodone. --cont home regimen --Atarax PRN   Tobacco use: Reports smoking 1/3 pack/day.    --cont nicotine patch   DVT prophylaxis: Lovenox SQ Code Status: DNR  Family Communication:  Level of care: Med-Surg Dispo:   The patient is from: home Anticipated d/c is to: SNF Anticipated d/c date is: whenever bed available Patient currently is medically ready to d/c.   Subjective and Interval History:  Pt complained of still a lot of pain in her arm.  Also reported she couldn't make it to bedside commode.  Eating ok.   Objective: Vitals:   11/02/20 1614 11/02/20 1943 11/03/20 0442 11/03/20 0740  BP: 105/84 110/67 119/78 107/65  Pulse: 79 83 81 74  Resp: 18 16 18 16   Temp: 98.1 F (36.7 C) 99 F (37.2 C) 97.9 F (36.6 C) 98.2 F (36.8 C)  TempSrc: Oral  Oral Oral  SpO2: 96% 94% 92% 96%  Weight:      Height:        Intake/Output Summary (Last 24 hours) at 11/03/2020 1638 Last data filed at 11/03/2020 1351 Gross per 24 hour  Intake 480 ml  Output --  Net 480 ml   Filed Weights   10/29/20 1634  Weight: 52.2 kg    Examination:   Constitutional: NAD, AAOx3 HEENT: conjunctivae and lids normal, EOMI CV: No cyanosis.   RESP: normal respiratory effort, on RA Extremities: No effusions, edema in BLE.  Right arm in sling. SKIN: warm, dry Neuro: II - XII grossly intact.   Psych: labile mood and affect.     Data Reviewed: I have personally reviewed following labs and imaging studies  CBC: Recent Labs  Lab 10/29/20 1633 10/30/20 0657 10/31/20 0711  WBC 9.7  5.8 5.3  HGB 15.0 12.2 12.1  HCT 43.5 35.3* 35.3*  MCV 115.4* 118.1* 117.7*  PLT 248 186 167   Basic Metabolic Panel: Recent Labs  Lab 10/29/20 1633 10/30/20 0657 10/31/20 0711  NA 141 134* 136  K 3.8 3.3* 3.5  CL 111 102 104  CO2 15* 22 25  GLUCOSE 88 76 108*  BUN 21* 15 9  CREATININE 0.78 0.68 0.55  CALCIUM 8.8* 8.3* 8.6*  MG  --  1.5* 1.9  PHOS  --  2.7  --    GFR: Estimated Creatinine Clearance: 59.9 mL/min (by C-G formula based on SCr of 0.55 mg/dL). Liver Function Tests: Recent  Labs  Lab 10/29/20 1900 10/30/20 0657  AST 42* 31  ALT 20 15  ALKPHOS 123 109  BILITOT 0.6 1.3*  PROT 7.8 6.2*  ALBUMIN 4.0 3.2*   Recent Labs  Lab 10/29/20 1900  LIPASE 46   Recent Labs  Lab 10/29/20 2243  AMMONIA 19   Coagulation Profile: Recent Labs  Lab 10/29/20 1900  INR 0.9   Cardiac Enzymes: No results for input(s): CKTOTAL, CKMB, CKMBINDEX, TROPONINI in the last 168 hours. BNP (last 3 results) No results for input(s): PROBNP in the last 8760 hours. HbA1C: No results for input(s): HGBA1C in the last 72 hours. CBG: No results for input(s): GLUCAP in the last 168 hours. Lipid Profile: No results for input(s): CHOL, HDL, LDLCALC, TRIG, CHOLHDL, LDLDIRECT in the last 72 hours. Thyroid Function Tests: No results for input(s): TSH, T4TOTAL, FREET4, T3FREE, THYROIDAB in the last 72 hours. Anemia Panel: No results for input(s): VITAMINB12, FOLATE, FERRITIN, TIBC, IRON, RETICCTPCT in the last 72 hours. Sepsis Labs: Recent Labs  Lab 10/29/20 1715 10/29/20 2243  LATICACIDVEN 2.5* 1.5    Recent Results (from the past 240 hour(s))  Resp Panel by RT-PCR (Flu A&B, Covid) Nasopharyngeal Swab     Status: None   Collection Time: 10/29/20 10:43 PM   Specimen: Nasopharyngeal Swab; Nasopharyngeal(NP) swabs in vial transport medium  Result Value Ref Range Status   SARS Coronavirus 2 by RT PCR NEGATIVE NEGATIVE Final    Comment: (NOTE) SARS-CoV-2 target nucleic acids are NOT DETECTED.  The SARS-CoV-2 RNA is generally detectable in upper respiratory specimens during the acute phase of infection. The lowest concentration of SARS-CoV-2 viral copies this assay can detect is 138 copies/mL. A negative result does not preclude SARS-Cov-2 infection and should not be used as the sole basis for treatment or other patient management decisions. A negative result may occur with  improper specimen collection/handling, submission of specimen other than nasopharyngeal swab, presence  of viral mutation(s) within the areas targeted by this assay, and inadequate number of viral copies(<138 copies/mL). A negative result must be combined with clinical observations, patient history, and epidemiological information. The expected result is Negative.  Fact Sheet for Patients:  BloggerCourse.com  Fact Sheet for Healthcare Providers:  SeriousBroker.it  This test is no t yet approved or cleared by the [place] FDA and  has been authorized for detection and/or diagnosis of SARS-CoV-2 by FDA under an Emergency Use Authorization (EUA). This EUA will remain  in effect (meaning this test can be used) for the duration of the COVID-19 declaration under Section 564(b)(1) of the Act, 21 U.S.C.section 360bbb-3(b)(1), unless the authorization is terminated  or revoked sooner.       Influenza A by PCR NEGATIVE NEGATIVE Final   Influenza B by PCR NEGATIVE NEGATIVE Final    Comment: (NOTE) The Xpert Xpress SARS-CoV-2/FLU/RSV  plus assay is intended as an aid in the diagnosis of influenza from Nasopharyngeal swab specimens and should not be used as a sole basis for treatment. Nasal washings and aspirates are unacceptable for Xpert Xpress SARS-CoV-2/FLU/RSV testing.  Fact Sheet for Patients: BloggerCourse.com  Fact Sheet for Healthcare Providers: SeriousBroker.it  This test is not yet approved or cleared by the [place] FDA and has been authorized for detection and/or diagnosis of SARS-CoV-2 by FDA under an Emergency Use Authorization (EUA). This EUA will remain in effect (meaning this test can be used) for the duration of the COVID-19 declaration under Section 564(b)(1) of the Act, 21 U.S.C. section 360bbb-3(b)(1), unless the authorization is terminated or revoked.  Performed at [facility], [address], [place], [place] [address]       Radiology  Studies: No results found.   Scheduled Meds:  enoxaparin (LOVENOX) injection  40 mg Subcutaneous Q24H   folic acid  1 mg Oral Daily   lamoTRIgine  100 mg Oral BID   multivitamin with minerals  1 tablet Oral Daily   nicotine  14 mg Transdermal Daily   OLANZapine  2.5 mg Oral BID   pantoprazole  40 mg Oral Daily   propranolol  40 mg Oral BID   sodium chloride flush  3 mL Intravenous Q12H   thiamine  100 mg Oral Daily   Or   thiamine  100 mg Intravenous Daily   traZODone  150 mg Oral QHS   Continuous Infusions:   LOS: 4 days     Darlin Priestly, MD [facility] If 7PM-7AM, please contact night-coverage 11/03/2020, 4:38 PM

## 2020-11-03 NOTE — Care Management Important Message (Signed)
Important Message  Patient Details  Name: Ariel White MRN: [id number] Date of Birth: 23-Jan-1961   Medicare Important Message Given:  Yes     Olegario Messier A Kendi Defalco 11/03/2020, 2:42 PM

## 2020-11-03 NOTE — Progress Notes (Signed)
OT Cancellation Note  Patient Details Name: Ariel White MRN: [id number] DOB: 08/26/1960   Cancelled Treatment:    Reason Eval/Treat Not Completed: Patient declined, no reason specified. Upon attempt, pt seated in recliner, endorses fatigue and 8/10 RUE pain. Despite encouragement, pt repeatedly declined OT tx. Agreeable to OT re-attempt next date.   Wynona Canes, MPH, MS, OTR/L ascom [phone] 11/03/20, 10:07 AM

## 2020-11-03 NOTE — Progress Notes (Signed)
Physical Therapy Treatment Patient Details Name: Ariel White MRN: [id number] DOB: [birth year] Today's Date: 11/03/2020    History of Present Illness Per MD notes, pt is a 60 y.o. female who presented to the ED for evaluation of right elbow pain after a fall at home night of 10/27/2020.  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 MD assessment includes alcohol use disorder with withdrawal, acute R supracondylar distal humerus fx, and frequent falls.    PT Comments    Pt in bed in better spirits today ready for gait.  She is able to exit bed left with supervision.  Stands and completes x 1 lap on unit with HHA +1.  She needs constant hands on assist due to balance deficits and is unsafe for gait/transfers without assist.  Several imbalances during gait that require min assist to correct.  High risk of falls if attempted on her own.  Will continue with mobility.  SNF remains appropriate for discharge given pt lives alone with limited support.   Follow Up Recommendations  SNF;Supervision/Assistance - 24 hour     Equipment Recommendations  None recommended by PT    Recommendations for Other Services       Precautions / Restrictions Precautions Precautions: Fall Required Braces or Orthoses: Splint/Cast Splint/Cast: long arm splint to R UE Restrictions Weight Bearing Restrictions: Yes RUE Weight Bearing: Non weight bearing Other Position/Activity Restrictions: assumed NWB    Mobility  Bed Mobility Overal bed mobility: Needs Assistance Bed Mobility: Supine to Sit     Supine to sit: Supervision          Transfers Overall transfer level: Needs assistance Equipment used: 1 person hand held assist Transfers: Sit to/from Stand Sit to Stand: Min guard;From elevated surface         General transfer comment: HHA + 1 LUE. Pt cued for fwd wt  shift.  Ambulation/Gait Ambulation/Gait assistance: Min guard;Min assist Gait Distance (Feet): 160 Feet Assistive device: 1 person hand held assist Gait Pattern/deviations: Decreased step length - right;Decreased step length - left;Decreased stride length;Wide base of support;Step-through pattern Gait velocity: decreased   General Gait Details: progressing well but needs +1 hands on assist at all times   Stairs             Wheelchair Mobility    Modified Rankin (Stroke Patients Only)       Balance Overall balance assessment: Needs assistance Sitting-balance support: No upper extremity supported Sitting balance-Leahy Scale: Fair     Standing balance support: Single extremity supported Standing balance-Leahy Scale: Fair                              Cognition Arousal/Alertness: Awake/alert Behavior During Therapy: WFL for tasks assessed/performed Overall Cognitive Status: Within Functional Limits for tasks assessed                                        Exercises      General Comments        Pertinent Vitals/Pain Pain Assessment: Faces Faces Pain Scale: Hurts even more Pain Location: R elbow Pain Descriptors / Indicators: Aching;Sore Pain Intervention(s): Limited activity within patient's tolerance;Monitored during session;Repositioned    Home Living  Prior Function            PT Goals (current goals can now be found in the care plan section) Progress towards PT goals: Progressing toward goals    Frequency    7X/week      PT Plan Current plan remains appropriate    Co-evaluation              AM-PAC PT "6 Clicks" Mobility   Outcome Measure  Help needed turning from your back to your side while in a flat bed without using bedrails?: A Little Help needed moving from lying on your back to sitting on the side of a flat bed without using bedrails?: A Little Help needed moving to  and from a bed to a chair (including a wheelchair)?: A Little Help needed standing up from a chair using your arms (e.g., wheelchair or bedside chair)?: A Little Help needed to walk in hospital room?: A Little Help needed climbing 3-5 steps with a railing? : A Lot 6 Click Score: 17    End of Session Equipment Utilized During Treatment: Gait belt Activity Tolerance: Patient limited by lethargy Patient left: in bed;with call bell/phone within reach;with bed alarm set Nurse Communication: Mobility status PT Visit Diagnosis: Unsteadiness on feet (R26.81);History of falling (Z91.81);Muscle weakness (generalized) (M62.81);Pain Pain - Right/Left: Right Pain - part of body: Arm     Time: 9562-1308 PT Time Calculation (min) (ACUTE ONLY): 11 min  Charges:  $Gait Training: 8-22 mins                    Danielle Dess, PTA 11/03/20, 10:33 AM , 10:31 AM

## 2020-11-03 NOTE — TOC Progression Note (Signed)
Transition of Care [facility]) - Progression Note    Patient Details  Name: Ariel White MRN: [id number] Date of Birth: 11-Mar-1961  Transition of Care [facility]) CM/SW Contact  Barrie Dunker, RN Phone Number: 11/03/2020, 11:05 AM  Clinical Narrative:     Reached out to multiple facilities requesting them to look at the patient and make a bed offer, awaiting offers       Expected Discharge Plan and Services                                                 Social Determinants of Health (SDOH) Interventions    Readmission Risk Interventions No flowsheet data found.

## 2020-11-04 MED ORDER — ACETAMINOPHEN 500 MG PO TABS: 1000.0000 mg | ORAL_TABLET | Freq: Three times a day (TID) | ORAL | Status: DC

## 2020-11-04 MED ORDER — ACETAMINOPHEN 500 MG PO TABS
1000.0000 mg | ORAL_TABLET | Freq: Three times a day (TID) | ORAL | Status: DC
  Administered 2020-11-04 – 2020-11-06 (×6): 1000 mg via ORAL
  Filled 2020-11-04 (×6): qty 2

## 2020-11-04 MED ORDER — OXYCODONE HCL 5 MG PO TABS
5.0000 mg | ORAL_TABLET | Freq: Four times a day (QID) | ORAL | Status: DC | PRN
  Administered 2020-11-04 – 2020-11-06 (×7): 5 mg via ORAL
  Filled 2020-11-04 (×8): qty 1

## 2020-11-04 NOTE — Plan of Care (Signed)
  Problem: Education: Goal: Knowledge of General Education information will improve Description Including pain rating scale, medication(s)/side effects and non-pharmacologic comfort measures Outcome: Progressing   

## 2020-11-04 NOTE — TOC Progression Note (Signed)
Transition of Care [facility]) - Progression Note    Patient Details  Name: Ariel White MRN: [id number] Date of Birth: 03-23-1961  Transition of Care [facility]) CM/SW Contact  Barrie Dunker, RN Phone Number: 11/04/2020, 8:59 AM  Clinical Narrative:     Followed up with [facility] on the pending Insurance authorization request to go to [facility] SNF, It is still Pending. Awaiting approval       Expected Discharge Plan and Services                                                 Social Determinants of Health (SDOH) Interventions    Readmission Risk Interventions No flowsheet data found.

## 2020-11-04 NOTE — Progress Notes (Signed)
Physical Therapy Treatment Patient Details Name: Ariel White MRN: [id number] DOB: 01-03-1961 Today's Date: 11/04/2020    History of Present Illness Per MD notes, pt is a 60 y.o. female who presented to the ED for evaluation of right elbow pain after a fall at home night of 10/27/2020.  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 MD assessment includes alcohol use disorder with withdrawal, acute R supracondylar distal humerus fx, and frequent falls.    PT Comments    Pt was long sitting in bed upon arriving. She agrees to PT session + is cooperative and motivated. Eager for OOB activity. Pt was able to exit L side of bed, stand with HHA + 1 for safety. Also performed STS with SPC. No LOB but is unsteady at times with only having +1 UE support. Pt fatigues quickly with several seated rest. Highly recommend DC to SNF to address deficits while maximizing independence with ADLs.    Follow Up Recommendations  SNF;Supervision/Assistance - 24 hour     Equipment Recommendations  None recommended by PT       Precautions / Restrictions Precautions Precautions: Fall Required Braces or Orthoses: Splint/Cast Splint/Cast: long arm splint to R UE Restrictions Weight Bearing Restrictions: Yes RUE Weight Bearing: Non weight bearing    Mobility  Bed Mobility Overal bed mobility: Needs Assistance Bed Mobility: Supine to Sit     Supine to sit: Supervision          Transfers Overall transfer level: Needs assistance Equipment used: 1 person hand held assist Transfers: Sit to/from Stand Sit to Stand: Min guard;From elevated surface         General transfer comment: HHA + 1 LUE. Pt cued for fwd wt shift. pt also performed STS using SPC  Ambulation/Gait Ambulation/Gait assistance: Min guard Gait Distance (Feet): 75 Feet Assistive device: 1 person hand  held assist;Straight cane Gait Pattern/deviations: Decreased step length - right;Decreased step length - left;Decreased stride length;Wide base of support;Step-through pattern Gait velocity: decreased   General Gait Details: Pt ambulated 75 ft without AD + 1 HHA. Pt also ambulated 1ft with SPC. Unsteadiness noted      Balance Overall balance assessment: Needs assistance Sitting-balance support: No upper extremity supported Sitting balance-Leahy Scale: Fair     Standing balance support: Single extremity supported Standing balance-Leahy Scale: Fair        Cognition Arousal/Alertness: Awake/alert Behavior During Therapy: WFL for tasks assessed/performed Overall Cognitive Status: Within Functional Limits for tasks assessed        General Comments: Pt is A and oriented x 3             Pertinent Vitals/Pain Pain Assessment: 0-10 Pain Score: 4  Faces Pain Scale: Hurts a little bit Pain Location: R elbow Pain Descriptors / Indicators: Aching;Sore Pain Intervention(s): Limited activity within patient's tolerance;Monitored during session;Premedicated before session;Repositioned     PT Goals (current goals can now be found in the care plan section) Acute Rehab PT Goals Patient Stated Goal: Go back home after rehab Progress towards PT goals: Progressing toward goals    Frequency    7X/week      PT Plan Current plan remains appropriate       AM-PAC PT "6 Clicks" Mobility   Outcome Measure  Help needed turning from your back to your side while in a flat bed without using bedrails?: A Little Help needed moving from lying on your  back to sitting on the side of a flat bed without using bedrails?: A Little Help needed moving to and from a bed to a chair (including a wheelchair)?: A Little Help needed standing up from a chair using your arms (e.g., wheelchair or bedside chair)?: A Little Help needed to walk in hospital room?: A Little Help needed climbing 3-5 steps with  a railing? : A Lot 6 Click Score: 17    End of Session Equipment Utilized During Treatment: Gait belt Activity Tolerance: Patient tolerated treatment well Patient left: in chair;with call bell/phone within reach;with chair alarm set Nurse Communication: Mobility status PT Visit Diagnosis: Unsteadiness on feet (R26.81);History of falling (Z91.81);Muscle weakness (generalized) (M62.81);Pain Pain - Right/Left: Right Pain - part of body: Arm     Time: 0925-0950 PT Time Calculation (min) (ACUTE ONLY): 25 min  Charges:  $Gait Training: 23-37 mins                     Jetta Lout PTA 11/04/20, 11:22 AM

## 2020-11-04 NOTE — Progress Notes (Signed)
PROGRESS NOTE    Ariel White  VEL:[id number] DOB: 10-Aug-1960 DOA: 10/29/2020 PCP: Carren Rang, PA-C  [id number]   Assessment & Plan:   Principal Problem:   Alcohol abuse with withdrawal [facility]) Active Problems:   Frequent falls   Right supracondylar humerus fracture, closed, initial encounter   Falls frequently   Ariel White is a 60 y.o. 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   Patient reports chronic balance issues resulting in frequent falls which has been an ongoing issue since she had a brain tumor excision in 1985.  She says night of 10/27/2020 she was getting up to go use the restroom when she lost her balance and fell onto the ground.  She hit her right elbow.  She said she did hit part of her head but did not lose consciousness.   Alcohol use disorder with withdrawal: Admitted with concern for early alcohol withdrawal.  gave inconsistent alcohol use history. --finished CIWA monitor -Continue thiamine, folate, MVM  Acute right supracondylar distal humerus fracture: after fall at home.  EDP discussed with on-call orthopedics who stated that patient can be set up with outpatient trauma surgery for eventual surgical fixation.  If patient remains in hospital more than 7 days then would need to transfer to [facility] for intervention. -Long-arm splint placed in ED --oxycodone 5 mg q6h PRN --schedule tylenol 1g TID   Chronic balance issues and Frequent falls: Chronic related to prior brain tumor resection and exacerbated by alcohol use.   --PT/OT rec SNF   Bipolar disorder/depression/anxiety: On home Zyprexa, Lamictal, propranolol, trazodone. --cont home regimen --Atarax PRN   Tobacco use: Reports  smoking 1/3 pack/day.   --cont nicotine patch   DVT prophylaxis: Lovenox SQ Code Status: DNR  Family Communication:  Level of care: Med-Surg Dispo:   The patient is from: home Anticipated d/c is to: SNF Anticipated d/c date is: whenever bed available Patient currently is medically ready to d/c.   Subjective and Interval History:  Pt reported doing a little better today.  Got up to chair and worked with PT.     Objective: Vitals:   11/03/20 2046 11/04/20 0424 11/04/20 0833 11/04/20 1138  BP: 116/70 (!) 105/53 135/89 107/80  Pulse: 85 74 80 75  Resp: 16 16 17 17   Temp: 98.7 F (37.1 C) 98.5 F (36.9 C) 98 F (36.7 C) 98.3 F (36.8 C)  TempSrc: Oral Oral  Oral  SpO2: 93% 93% 92% 93%  Weight:      Height:        Intake/Output Summary (Last 24 hours) at 11/04/2020 1648 Last data filed at 11/04/2020 1347 Gross per 24 hour  Intake 120 ml  Output --  Net 120 ml   Filed Weights   10/29/20 1634  Weight: 52.2 kg    Examination:   Constitutional: NAD, AAOx3 HEENT: conjunctivae and lids normal, EOMI CV: No cyanosis.   RESP: normal respiratory effort, on RA Extremities: No effusions, edema in BLE.  Right arm in sling SKIN: warm, dry Neuro: II - XII grossly intact.   Psych: better mood and affect today   Data Reviewed: I have personally reviewed following labs and imaging studies  CBC: Recent Labs  Lab 10/29/20 1633 10/30/20 0657 10/31/20 0711  WBC 9.7 5.8 5.3  HGB 15.0 12.2 12.1  HCT 43.5 35.3* 35.3*  MCV 115.4* 118.1* 117.7*  PLT 248 186 167   Basic Metabolic Panel: Recent Labs  Lab 10/29/20 1633 10/30/20 0657 10/31/20 0711  NA 141 134* 136  K 3.8 3.3* 3.5  CL 111 102 104  CO2 15* 22 25  GLUCOSE 88 76 108*  BUN 21* 15 9  CREATININE 0.78 0.68 0.55  CALCIUM 8.8* 8.3* 8.6*  MG  --  1.5* 1.9  PHOS  --  2.7  --    GFR: Estimated Creatinine Clearance: 59.9 mL/min (by C-G formula based on SCr of 0.55 mg/dL). Liver Function Tests: Recent Labs   Lab 10/29/20 1900 10/30/20 0657  AST 42* 31  ALT 20 15  ALKPHOS 123 109  BILITOT 0.6 1.3*  PROT 7.8 6.2*  ALBUMIN 4.0 3.2*   Recent Labs  Lab 10/29/20 1900  LIPASE 46   Recent Labs  Lab 10/29/20 2243  AMMONIA 19   Coagulation Profile: Recent Labs  Lab 10/29/20 1900  INR 0.9   Cardiac Enzymes: No results for input(s): CKTOTAL, CKMB, CKMBINDEX, TROPONINI in the last 168 hours. BNP (last 3 results) No results for input(s): PROBNP in the last 8760 hours. HbA1C: No results for input(s): HGBA1C in the last 72 hours. CBG: No results for input(s): GLUCAP in the last 168 hours. Lipid Profile: No results for input(s): CHOL, HDL, LDLCALC, TRIG, CHOLHDL, LDLDIRECT in the last 72 hours. Thyroid Function Tests: No results for input(s): TSH, T4TOTAL, FREET4, T3FREE, THYROIDAB in the last 72 hours. Anemia Panel: No results for input(s): VITAMINB12, FOLATE, FERRITIN, TIBC, IRON, RETICCTPCT in the last 72 hours. Sepsis Labs: Recent Labs  Lab 10/29/20 1715 10/29/20 2243  LATICACIDVEN 2.5* 1.5    Recent Results (from the past 240 hour(s))  Resp Panel by RT-PCR (Flu A&B, Covid) Nasopharyngeal Swab     Status: None   Collection Time: 10/29/20 10:43 PM   Specimen: Nasopharyngeal Swab; Nasopharyngeal(NP) swabs in vial transport medium  Result Value Ref Range Status   SARS Coronavirus 2 by RT PCR NEGATIVE NEGATIVE Final    Comment: (NOTE) SARS-CoV-2 target nucleic acids are NOT DETECTED.  The SARS-CoV-2 RNA is generally detectable in upper respiratory specimens during the acute phase of infection. The lowest concentration of SARS-CoV-2 viral copies this assay can detect is 138 copies/mL. A negative result does not preclude SARS-Cov-2 infection and should not be used as the sole basis for treatment or other patient management decisions. A negative result may occur with  improper specimen collection/handling, submission of specimen other than nasopharyngeal swab, presence of  viral mutation(s) within the areas targeted by this assay, and inadequate number of viral copies(<138 copies/mL). A negative result must be combined with clinical observations, patient history, and epidemiological information. The expected result is Negative.  Fact Sheet for Patients:  BloggerCourse.com  Fact Sheet for Healthcare Providers:  SeriousBroker.it  This test is no t yet approved or cleared by the [place] FDA and  has been authorized for detection and/or diagnosis of SARS-CoV-2 by FDA under an Emergency Use Authorization (EUA). This EUA will remain  in effect (meaning this test can be used) for the duration of the COVID-19 declaration under Section 564(b)(1) of the Act, 21 U.S.C.section 360bbb-3(b)(1), unless the authorization is terminated  or revoked sooner.       Influenza A by PCR NEGATIVE NEGATIVE Final   Influenza B by PCR NEGATIVE NEGATIVE Final    Comment: (NOTE) The Xpert Xpress SARS-CoV-2/FLU/RSV plus assay is  intended as an aid in the diagnosis of influenza from Nasopharyngeal swab specimens and should not be used as a sole basis for treatment. Nasal washings and aspirates are unacceptable for Xpert Xpress SARS-CoV-2/FLU/RSV testing.  Fact Sheet for Patients: BloggerCourse.com  Fact Sheet for Healthcare Providers: SeriousBroker.it  This test is not yet approved or cleared by the [place] FDA and has been authorized for detection and/or diagnosis of SARS-CoV-2 by FDA under an Emergency Use Authorization (EUA). This EUA will remain in effect (meaning this test can be used) for the duration of the COVID-19 declaration under Section 564(b)(1) of the Act, 21 U.S.C. section 360bbb-3(b)(1), unless the authorization is terminated or revoked.  Performed at [facility], [address], [place], [place] [address]       Radiology  Studies: No results found.   Scheduled Meds:  acetaminophen  1,000 mg Oral TID   enoxaparin (LOVENOX) injection  40 mg Subcutaneous Q24H   folic acid  1 mg Oral Daily   lamoTRIgine  100 mg Oral BID   multivitamin with minerals  1 tablet Oral Daily   nicotine  14 mg Transdermal Daily   OLANZapine  2.5 mg Oral BID   pantoprazole  40 mg Oral Daily   propranolol  40 mg Oral BID   sodium chloride flush  3 mL Intravenous Q12H   thiamine  100 mg Oral Daily   Or   thiamine  100 mg Intravenous Daily   traZODone  150 mg Oral QHS   Continuous Infusions:   LOS: 5 days     Darlin Priestly, MD [facility] If 7PM-7AM, please contact night-coverage 11/04/2020, 4:48 PM

## 2020-11-04 NOTE — TOC Progression Note (Signed)
Transition of Care [facility]) - Progression Note    Patient Details  Name: Ariel White MRN: [id number] Date of Birth: Jun 10, 1960  Transition of Care [facility]) CM/SW Contact  Barrie Dunker, RN Phone Number: 11/04/2020, 3:20 PM  Clinical Narrative:     Reached out to [facility] thru the portal to inquire on status, it shows Pending, Sent a note inquiring if anything else is needed, awaiting a responce       Expected Discharge Plan and Services                                                 Social Determinants of Health (SDOH) Interventions    Readmission Risk Interventions No flowsheet data found.

## 2020-11-04 NOTE — Progress Notes (Signed)
Occupational Therapy Treatment Patient Details Name: Ariel White MRN: [id number] DOB: 1960/05/03 Today's Date: 11/04/2020    History of present illness Per MD notes, pt is a 60 y.o. female who presented to the ED for evaluation of right elbow pain after a fall at home night of 10/27/2020.  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 MD assessment includes alcohol use disorder with withdrawal, acute R supracondylar distal humerus fx, and frequent falls.   OT comments  Upon entering the room, pt supine in bed but agreeable to OT intervention. Pt performing bed mobility with min A and assist to properly adjust sling. Pt standing with min A and ambulating with min HHA to bathroom for toileting need. Min guard - min A for balance with clothing management.  Pt standing at sink for grooming and to comb hair with min A for balance as well. Pt requesting to return to bed secondary to fatigue. Min guard for sit >supine and pt adjusts self for comfort in bed.    Follow Up Recommendations  SNF    Equipment Recommendations  Other (comment) (defer to next venue of care)       Precautions / Restrictions Precautions Precautions: Fall Required Braces or Orthoses: Splint/Cast Splint/Cast: long arm splint to R UE Restrictions Weight Bearing Restrictions: Yes RUE Weight Bearing: Non weight bearing       Mobility Bed Mobility Overal bed mobility: Needs Assistance Bed Mobility: Supine to Sit     Supine to sit: Min assist     General bed mobility comments: Min A to exit bed with cuing for NWB    Transfers Overall transfer level: Needs assistance Equipment used: 1 person hand held assist Transfers: Sit to/from Stand Sit to Stand: Min guard;From elevated surface              Balance Overall balance assessment: Needs assistance Sitting-balance support:  No upper extremity supported Sitting balance-Leahy Scale: Fair     Standing balance support: Single extremity supported Standing balance-Leahy Scale: Fair                             ADL either performed or assessed with clinical judgement   ADL Overall ADL's : Needs assistance/impaired     Grooming: Brushing hair;Wash/dry face;Standing;Min guard                   Toilet Transfer: Minimal Holiday representative;Ambulation   Toileting- Clothing Manipulation and Hygiene: Minimal assistance;Sit to/from stand               Vision Baseline Vision/History: Wears glasses Patient Visual Report: No change from baseline            Cognition Arousal/Alertness: Awake/alert Behavior During Therapy: WFL for tasks assessed/performed Overall Cognitive Status: Within Functional Limits for tasks assessed                                 General Comments: Pt is A and oriented x 3 and very cooperative.                   Pertinent Vitals/ Pain       Pain Assessment: Faces Faces Pain Scale: Hurts a little bit Pain Location: R elbow Pain Descriptors / Indicators: Aching;Sore Pain Intervention(s): Limited activity within patient's tolerance;Monitored during  session;Premedicated before session;Repositioned   Frequency  Min 1X/week        Progress Toward Goals  OT Goals(current goals can now be found in the care plan section)  Progress towards OT goals: Progressing toward goals  Acute Rehab OT Goals Patient Stated Goal: Go back home after rehab OT Goal Formulation: With patient Time For Goal Achievement: 11/13/20 Potential to Achieve Goals: Good  Plan Discharge plan remains appropriate;Frequency remains appropriate       AM-PAC OT "6 Clicks" Daily Activity     Outcome Measure   Help from another person eating meals?: A Little Help from another person taking care of personal grooming?: A Little Help from another person toileting,  which includes using toliet, bedpan, or urinal?: A Lot Help from another person bathing (including washing, rinsing, drying)?: A Lot Help from another person to put on and taking off regular upper body clothing?: A Lot Help from another person to put on and taking off regular lower body clothing?: A Lot 6 Click Score: 14    End of Session    OT Visit Diagnosis: Unsteadiness on feet (R26.81);Repeated falls (R29.6)   Activity Tolerance Patient tolerated treatment well   Patient Left in chair;with call bell/phone within reach;with chair alarm set;with nursing/sitter in room   Nurse Communication Mobility status        Time: 5597-4163 OT Time Calculation (min): 12 min  Charges: OT General Charges $OT Visit: 1 Visit OT Treatments $Self Care/Home Management : 8-22 mins  Jackquline Denmark, MS, OTR/L , CBIS ascom [phone]  11/04/20, 4:18 PM

## 2020-11-05 ENCOUNTER — Inpatient Hospital Stay: Payer: Medicare Other

## 2020-11-05 DIAGNOSIS — R296 Repeated falls: Secondary | ICD-10-CM

## 2020-11-05 DIAGNOSIS — S72464A Nondisplaced supracondylar fracture with intracondylar extension of lower end of right femur, initial encounter for closed fracture: Secondary | ICD-10-CM

## 2020-11-05 NOTE — Progress Notes (Signed)
PROGRESS NOTE    Ariel White  HLK:[id number] DOB: [birth year] DOA: 10/29/2020 PCP: Carren Rang, PA-C   Chief Complaint  Patient presents with   Fall   Elbow Injury    Brief Narrative:  Ariel White is a 60 y.o. 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   Acute right supracondylar distal humerus fracture EDP discussed with on-call orthopedics recommended outpatient trauma surgery for surgical fixation. Patient today reports worsening pain in the right elbow repeat x-rays ordered. Pain control and therapy evaluations recommending SNF versus home health.   Frequent falls related to prior brain tumor resection and exacerbated by alcohol use Therapy evaluations   Anxiety and depression Resume home regimen.   Tobacco use Continue with nicotine patch.    DVT prophylaxis: (Lovenox ) Code Status: DNR Family Communication: ( none at bedside) Disposition:   Status is: Inpatient  Remains inpatient appropriate because:Unsafe d/c plan  Dispo: The patient is from: Home              Anticipated d/c is to: Home              Patient currently is not medically stable to d/c.   Difficult to place patient No       Consultants:  Orthopedics  Procedures: None Antimicrobials: None  Subjective: Reports worsening pain in the right arm and elbow  Objective: Vitals:   11/05/20 0417  11/05/20 0722 11/05/20 1111 11/05/20 1606  BP: (!) 124/54 123/72 112/79 120/72  Pulse: 76 66 76 76  Resp: 16 16 16 16   Temp: 98.7 F (37.1 C) 98.4 F (36.9 C) 98.1 F (36.7 C) 98.1 F (36.7 C)  TempSrc: Oral     SpO2: 94% 93% 96% 96%  Weight:      Height:        Intake/Output Summary (Last 24 hours) at 11/05/2020 1738 Last data filed at 11/05/2020 1402 Gross per 24 hour  Intake 360 ml  Output --  Net 360 ml   Filed Weights   10/29/20 1634  Weight: 52.2 kg    Examination:  General exam: Appears calm and comfortable  Respiratory system: Clear to auscultation. Respiratory effort normal. Cardiovascular system: S1 & S2 heard, RRR. No JVD,No pedal edema. Gastrointestinal system: Abdomen is nondistended, soft and nontender.. Normal bowel sounds heard. Central nervous system: Alert and oriented. No focal neurological deficits. Extremities: worsening pain in the right elbow. She reports she is not able to splint properly.  Skin: No rashes, lesions or ulcers Psychiatry: Judgement and insight appear normal. Mood & affect appropriate.     Data Reviewed: I have personally reviewed following labs and imaging studies  CBC: Recent Labs  Lab 10/30/20 0657 10/31/20 0711  WBC 5.8 5.3  HGB 12.2 12.1  HCT 35.3* 35.3*  MCV 118.1* 117.7*  PLT 186 167    Basic Metabolic Panel: Recent Labs  Lab 10/30/20 0657 10/31/20 0711  NA 134* 136  K 3.3* 3.5  CL 102 104  CO2 22 25  GLUCOSE 76 108*  BUN 15 9  CREATININE 0.68 0.55  CALCIUM 8.3* 8.6*  MG 1.5* 1.9  PHOS 2.7  --     GFR: Estimated Creatinine Clearance: 59.9 mL/min (by C-G formula based on SCr of 0.55 mg/dL).  Liver Function Tests: Recent Labs  Lab 10/29/20 1900 10/30/20 0657  AST 42* 31  ALT 20 15  ALKPHOS 123 109  BILITOT 0.6 1.3*  PROT 7.8 6.2*  ALBUMIN 4.0 3.2*    CBG: No results for input(s): GLUCAP in the last 168 hours.   Recent Results (from the past 240 hour(s))  Resp Panel by RT-PCR (Flu A&B,  Covid) Nasopharyngeal Swab     Status: None   Collection Time: 10/29/20 10:43 PM   Specimen: Nasopharyngeal Swab; Nasopharyngeal(NP) swabs in vial transport medium  Result Value Ref Range Status   SARS Coronavirus 2 by RT PCR NEGATIVE NEGATIVE Final    Comment: (NOTE) SARS-CoV-2 target nucleic acids are NOT DETECTED.  The SARS-CoV-2 RNA is generally detectable in upper respiratory specimens during the acute phase of infection. The lowest concentration of SARS-CoV-2 viral copies this assay can detect is 138 copies/mL. A negative result does not preclude SARS-Cov-2 infection and should not be used as the sole basis for treatment or other patient management decisions. A negative result may occur with  improper specimen collection/handling, submission of specimen other than nasopharyngeal swab, presence of viral mutation(s) within the areas targeted by this assay, and inadequate number of viral copies(<138 copies/mL). A negative result must be combined with clinical observations, patient history, and epidemiological information. The expected result is Negative.  Fact Sheet for Patients:  BloggerCourse.com  Fact Sheet for Healthcare Providers:  SeriousBroker.it  This test is no t yet approved or cleared by the [place] FDA and  has been authorized for detection and/or diagnosis of SARS-CoV-2 by FDA under an Emergency Use Authorization (EUA). This EUA will remain  in effect (meaning this test can be used) for the duration of the COVID-19 declaration under Section 564(b)(1) of the Act, 21 U.S.C.section 360bbb-3(b)(1), unless the authorization is terminated  or revoked sooner.       Influenza A by PCR NEGATIVE NEGATIVE Final   Influenza B by PCR NEGATIVE NEGATIVE Final    Comment: (NOTE) The Xpert Xpress SARS-CoV-2/FLU/RSV plus assay is intended as an aid in the diagnosis of influenza from Nasopharyngeal swab specimens and should  not be used as a sole basis for treatment. Nasal washings and aspirates are unacceptable for Xpert Xpress SARS-CoV-2/FLU/RSV testing.  Fact Sheet for Patients: BloggerCourse.com  Fact Sheet for Healthcare Providers: SeriousBroker.it  This test is not yet approved or cleared by the [place] FDA and has been authorized for detection and/or diagnosis of SARS-CoV-2 by FDA under an Emergency Use Authorization (EUA). This EUA will remain in effect (meaning this test can be used) for the duration of the COVID-19 declaration under Section 564(b)(1) of the Act, 21 U.S.C. section 360bbb-3(b)(1), unless the authorization is terminated or revoked.  Performed at [facility], [address], [place], [place] [address]          Radiology Studies: DG Elbow 2 Views Right  Result Date: 11/05/2020 CLINICAL DATA:  Distal humerus fracture.  Increased pain. EXAM: RIGHT ELBOW - 2 VIEW COMPARISON:  Right upper radiographs and right elbow CT 10/29/20. FINDINGS: Fracture line the distal humerus again noted. No new fractures are present. Elbow is splinted. IMPRESSION: 1. Stable fracture line through the distal humerus.  2. No new fractures. Electronically Signed   By: Marin Roberts M.D.   On: 11/05/2020 16:02        Scheduled Meds:  acetaminophen  1,000 mg Oral TID   enoxaparin (LOVENOX) injection  40 mg Subcutaneous Q24H   folic acid  1 mg Oral Daily   lamoTRIgine  100 mg Oral BID   multivitamin with minerals  1 tablet Oral Daily   nicotine  14 mg Transdermal Daily   OLANZapine  2.5 mg Oral BID   pantoprazole  40 mg Oral Daily   propranolol  40 mg Oral BID   sodium chloride flush  3 mL Intravenous Q12H   thiamine  100 mg Oral Daily   Or   thiamine  100 mg Intravenous Daily   traZODone  150 mg Oral QHS   Continuous Infusions:   LOS: 6 days       Kathlen Mody, MD [facility]   To contact the attending  provider between 7A-7P or the covering provider during after hours 7P-7A, please log into the web site [email or web address] and access using universal [place] password for that web site. If you do not have the password, please call the hospital operator.  11/05/2020, 5:38 PM

## 2020-11-05 NOTE — Progress Notes (Signed)
Occupational Therapy Treatment Patient Details Name: Ariel White MRN: [id number] DOB: 1961/01/27 Today's Date: 11/05/2020    History of present illness Per MD notes, pt is a 60 y.o. female who presented to the ED for evaluation of right elbow pain after a fall at home night of 10/27/2020.  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 MD assessment includes alcohol use disorder with withdrawal, acute R supracondylar distal humerus fx, and frequent falls.   OT comments  Ariel White was seen for OT treatment on this date. Upon arrival to room pt reclined in bed, eager to d/c home. Pt requires MOD A don/doff R sling and gown seated EOB. SETUP for UB bathing seated including R underarm. SUPERVISION sit<>stand and in room mobility ~20 ft, no AD used but pt reaching for intermittent UE support on bed rail during turns.   Pt making good progress toward goals. Pt continues to benefit from skilled OT services to maximize return to PLOF and minimize risk of future falls, injury, caregiver burden, and readmission. Will continue to follow POC. Discharge recommendation updated to [facility], pt reports having family available to assist with AM/PM dressing/bathing tasks.    Follow Up Recommendations  Home health OT;Supervision - Intermittent (Assistance from family for UB dressing/bathing)    Equipment Recommendations  None recommended by OT    Recommendations for Other Services      Precautions / Restrictions Precautions Precautions: Fall Required Braces or Orthoses: Splint/Cast Splint/Cast: long arm splint to R UE Restrictions Weight Bearing Restrictions: Yes RUE Weight Bearing: Non weight bearing       Mobility Bed Mobility Overal bed mobility: Modified Independent             General bed mobility comments: HOB slightly elevated. no physical assistance  required    Transfers Overall transfer level: Needs assistance Equipment used: None Transfers: Sit to/from Stand Sit to Stand: Supervision              Balance Overall balance assessment: Needs assistance Sitting-balance support: No upper extremity supported;Feet supported Sitting balance-Leahy Scale: Good     Standing balance support: No upper extremity supported Standing balance-Leahy Scale: Good Standing balance comment: reaching for rails with turning                           ADL either performed or assessed with clinical judgement   ADL Overall ADL's : Needs assistance/impaired                                       General ADL Comments: MOD A don/doff R sling seated EOB. SUPERVISION for ADL t/f and in room mobility ~20 ft, no AD used but pt reaching for intermittent UE support on bed rail during turns. SETUP for seated grooming ADLs      Cognition Arousal/Alertness: Awake/alert Behavior During Therapy: WFL for tasks assessed/performed Overall Cognitive Status: Within Functional Limits for tasks assessed                                 General Comments: Pt was A and O x 4 and extremely motivated and pleasant." I'm ready to go home."        Exercises Exercises: Other exercises Other Exercises  Other Exercises: Pt edcuated re: OT role, d/c recs, falls prevention, sling mgmt Other Exercises: UBD, grooming, sup>sit, sit<>stand, sitting/standing balance/tolerance           Pertinent Vitals/ Pain       Pain Assessment: Faces Pain Score: 4  Faces Pain Scale: Hurts little more Pain Location: R elbow Pain Descriptors / Indicators: Aching;Sore Pain Intervention(s): Limited activity within patient's tolerance;Repositioned         Frequency  Min 1X/week        Progress Toward Goals  OT Goals(current goals can now be found in the care plan section)  Progress towards OT goals: Progressing toward goals  Acute  Rehab OT Goals Patient Stated Goal: Go back home after rehab OT Goal Formulation: With patient Time For Goal Achievement: 11/13/20 Potential to Achieve Goals: Good ADL Goals Pt Will Perform Upper Body Dressing: with mod assist;sitting Pt Will Perform Lower Body Dressing: with min assist;sit to/from stand Pt Will Transfer to Toilet: ambulating;regular height toilet;Independently  Plan Discharge plan needs to be updated;Frequency remains appropriate       AM-PAC OT "6 Clicks" Daily Activity     Outcome Measure   Help from another person eating meals?: A Little Help from another person taking care of personal grooming?: A Little Help from another person toileting, which includes using toliet, bedpan, or urinal?: A Little Help from another person bathing (including washing, rinsing, drying)?: A Little Help from another person to put on and taking off regular upper body clothing?: A Lot Help from another person to put on and taking off regular lower body clothing?: A Lot 6 Click Score: 16    End of Session    OT Visit Diagnosis: Unsteadiness on feet (R26.81);Repeated falls (R29.6)   Activity Tolerance Patient tolerated treatment well   Patient Left in chair;with call bell/phone within reach;with chair alarm set   Nurse Communication          Time: 1117-1130 OT Time Calculation (min): 13 min  Charges: OT General Charges $OT Visit: 1 Visit OT Treatments $Self Care/Home Management : 8-22 mins  Kathie Dike, M.S. OTR/L  11/05/20, 1:59 PM  ascom [phone]

## 2020-11-05 NOTE — TOC Progression Note (Addendum)
Transition of Care [facility]) - Progression Note    Patient Details  Name: Ariel White MRN: [id number] Date of Birth: [birth year]  [facility] CM/SW Contact  Barrie Dunker, RN Phone Number: 11/05/2020, 8:33 AM  Clinical Narrative:     Received notification from [facility] with a denial for the patient to go to [facility], The patient can appeal and call [phone], fax number for the appeal is [phone], the patient also has Medicaid and may use that if the facility will accept, however the patient will have to agree to a 30 day stay and pay the difference I notified the patient of the denial, she stated that she had this the last time when she had shoulder surgery and she was able to go home with home health, she would like to go home with home health again.I contacted Elnita Maxwell with [employer] and am awaiting a response.   I spoke with Elnita Maxwell From [facility] and she has agreed to see the patient, they are a week out, I notified the patient and she is agreeable    Expected Discharge Plan and Services                                                 Social Determinants of Health (SDOH) Interventions    Readmission Risk Interventions No flowsheet data found.

## 2020-11-05 NOTE — Progress Notes (Signed)
Physical Therapy Treatment Patient Details Name: Ariel White MRN: [id number] DOB: November 06, 1960 Today's Date: 11/05/2020    History of Present Illness Per MD notes, pt is a 60 y.o. female who presented to the ED for evaluation of right elbow pain after a fall at home night of 10/27/2020.  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 MD assessment includes alcohol use disorder with withdrawal, acute R supracondylar distal humerus fx, and frequent falls.    PT Comments    Pt was long sitting in bed and eager for PT session." My insurance denied rehab but I feel ready to go home." She did demonstrate overall much improved safety throughout session with ambulation with SPC. Was able to safely ascend/descend 4 stair without LOB. If pt is to DC home, recommend HHPT to continue to follow and progress towards PLOF. Will benefit from continued PT at DC to improve safety with ADLs.    Follow Up Recommendations  Supervision - Intermittent;Home health PT     Equipment Recommendations  None recommended by PT       Precautions / Restrictions Precautions Precautions: Fall Required Braces or Orthoses: Splint/Cast Splint/Cast: long arm splint to R UE Restrictions Weight Bearing Restrictions: Yes RUE Weight Bearing: Non weight bearing    Mobility  Bed Mobility Overal bed mobility: Modified Independent    General bed mobility comments: HOB slightly elevated. no physical assistance required    Transfers Overall transfer level: Needs assistance Equipment used: Straight cane Transfers: Sit to/from Stand Sit to Stand: Supervision;From elevated surface     Ambulation/Gait Ambulation/Gait assistance: Supervision Gait Distance (Feet): 200 Feet Assistive device: Straight cane Gait Pattern/deviations: Step-through pattern Gait velocity: decreased   General Gait  Details: pt demonstrated improved safety with ambulation and use of SPC. Reports she will buy cane at DC   Stairs Stairs: Yes Stairs assistance: Supervision Stair Management: One rail Left;With cane Number of Stairs: 4 General stair comments: Pt demonstarted safe ability to ascend/descend 4 stair with supervision + use of SPC/1 rail.     Balance Overall balance assessment: Modified Independent         Standing balance support: Single extremity supported Standing balance-Leahy Scale: Good Standing balance comment: balance has much improved over hospitalization        Cognition Arousal/Alertness: Awake/alert Behavior During Therapy: WFL for tasks assessed/performed Overall Cognitive Status: Within Functional Limits for tasks assessed        General Comments: Pt was A and O x 4 and extremely motivated and pleasant." I'm ready to go home."             Pertinent Vitals/Pain Pain Assessment: 0-10 Pain Score: 4  Faces Pain Scale: Hurts a little bit Pain Location: R elbow Pain Descriptors / Indicators: Aching;Sore Pain Intervention(s): Limited activity within patient's tolerance;Monitored during session;Premedicated before session;Repositioned     PT Goals (current goals can now be found in the care plan section) Progress towards PT goals: Progressing toward goals    Frequency    7X/week      PT Plan Discharge plan needs to be updated       AM-PAC PT "6 Clicks" Mobility   Outcome Measure  Help needed turning from your back to your side while in a flat bed without using bedrails?: A Little Help needed moving from lying on your back to sitting on the side of a flat bed without using bedrails?: A Little Help  needed moving to and from a bed to a chair (including a wheelchair)?: A Little Help needed standing up from a chair using your arms (e.g., wheelchair or bedside chair)?: A Little Help needed to walk in hospital room?: A Little Help needed climbing 3-5 steps  with a railing? : A Little 6 Click Score: 18    End of Session   Activity Tolerance: Patient tolerated treatment well Patient left: Other (comment) (seated EOB with CNA in room assisting) Nurse Communication: Mobility status PT Visit Diagnosis: Unsteadiness on feet (R26.81);History of falling (Z91.81);Muscle weakness (generalized) (M62.81);Pain Pain - Right/Left: Right Pain - part of body: Arm     Time: 6384-6659 PT Time Calculation (min) (ACUTE ONLY): 16 min  Charges:  $Gait Training: 8-22 mins                     Jetta Lout PTA 11/05/20, 10:45 AM

## 2020-11-06 MED ORDER — SENNOSIDES-DOCUSATE SODIUM 8.6-50 MG PO TABS: 1.0000 | ORAL_TABLET | Freq: Every evening | ORAL | Status: AC | PRN

## 2020-11-06 MED ORDER — OXYCODONE HCL 5 MG PO TABS: 5.0000 mg | ORAL_TABLET | Freq: Four times a day (QID) | ORAL | 0 refills | Status: AC | PRN

## 2020-11-06 MED ORDER — ADULT MULTIVITAMIN W/MINERALS CH: 1.0000 | ORAL_TABLET | Freq: Every day | ORAL | Status: AC

## 2020-11-06 MED ORDER — THIAMINE HCL 100 MG PO TABS: 100.0000 mg | ORAL_TABLET | Freq: Every day | ORAL | Status: AC

## 2020-11-06 MED ORDER — FOLIC ACID 1 MG PO TABS: 1.0000 mg | ORAL_TABLET | Freq: Every day | ORAL | Status: AC

## 2020-11-06 NOTE — Progress Notes (Signed)
Iv dc'd tip intact. AVS given and discussed. Reviewed medications, schedule, activity, fu appointments and diet. Pt will be receiving home health. Sister is being called for transport home. Pt is alert and has no signs of distress at this time.

## 2020-11-06 NOTE — Discharge Summary (Signed)
Physician Discharge Summary  Ariel White:[id number] DOB: 05-13-1960 DOA: 10/29/2020  PCP: Carren Rang, PA-C  Admit date: 10/29/2020 Discharge date: 11/06/2020  Admitted From: Home.  Disposition:  Home.   Recommendations for Outpatient Follow-up:  Follow up with PCP in 1-2 weeks Please obtain BMP/CBC in one week Please follow up with orthopedics as recommended in one week.  Home Health:Yes  Discharge Condition:stable.  CODE STATUS:Full code.  Diet recommendation: Heart Healthy   Brief/Interim Summary: Ariel White is a 60 y.o. 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     Acute right supracondylar distal humerus fracture EDP discussed with on-call orthopedics recommended outpatient trauma surgery for surgical fixation. Patient today reports worsening pain in the right elbow repeat x-rays ordered. Pain control and therapy evaluations recommending SNF versus home health.     Frequent falls related to prior brain tumor resection and exacerbated by alcohol use Therapy evaluations SNF vs home health. Pt would like to go home and follow up with orthopedics.      Anxiety and depression Resume home regimen.     Tobacco use Continue with nicotine patch on discharge.   Discharge Instructions  Discharge Instructions     Diet - low sodium heart healthy   Complete by: As directed     Discharge instructions   Complete by: As directed    Please follow up with Orthopedics in less than a week.   Increase activity slowly   Complete by: As directed       Allergies as of 11/06/2020       Reactions   Compazine [prochlorperazine Edisylate] Other (See Comments)   euphoria   Ivp Dye [iodinated Diagnostic Agents] Anaphylaxis   "stopped her heart"   Baclofen Other (See Comments)   dizziness   Imitrex [sumatriptan] Swelling   "throat swelling"   Voltaren [diclofenac] Other (See Comments)   Cannot hold onto objects (drops things)        Medication List     STOP taking these medications    acetaminophen 650 MG CR tablet Commonly known as: TYLENOL   cyanocobalamin 1000 MCG tablet   neomycin-bacitracin-polymyxin Oint Commonly known as: NEOSPORIN       TAKE these medications    diphenhydramine-acetaminophen 25-500 MG Tabs tablet Commonly known as: TYLENOL PM Take 2 tablets by mouth at bedtime as needed (sleep).   folic acid 1 MG tablet Commonly known as: FOLVITE Take 1 tablet (1 mg total) by mouth daily. Start taking on: November 07, 2020   lamoTRIgine 100 MG tablet Commonly known as: LAMICTAL Take 100 mg by mouth 2 (two) times daily. What changed: Another medication with the same name was removed. Continue taking this medication, and follow the directions you see here.   multivitamin with minerals Tabs tablet Take 1 tablet by mouth daily. Start taking on: November 07, 2020   OLANZapine 2.5 MG tablet Commonly known as: ZYPREXA Take 1 tablet (2.5 mg total) by mouth 2 (two) times daily.  oxyCODONE 5 MG immediate release tablet Commonly known as: Oxy IR/ROXICODONE Take 1 tablet (5 mg total) by mouth every 6 (six) hours as needed for up to 5 days for moderate pain or severe pain (Hold & Call MD if SBP<90, HR<65, RR<10, O2<90, or altered mental status.).   pantoprazole 40 MG tablet Commonly known as: PROTONIX Take 1 tablet (40 mg total) by mouth  daily. What changed: Another medication with the same name was removed. Continue taking this medication, and follow the directions you see here.   propranolol 40 MG tablet Commonly known as: INDERAL Take 40 mg by mouth 2 (two) times daily.   senna-docusate 8.6-50 MG tablet Commonly known as: Senokot-S Take 1 tablet by mouth at bedtime as needed for mild constipation.   thiamine 100 MG tablet Take 1 tablet (100 mg total) by mouth daily. Start taking on: November 07, 2020   traZODone 150 MG tablet Commonly known as: DESYREL Take 150 mg by mouth at bedtime.        Contact information for follow-up providers     Carren Rang, PA-C. Schedule an appointment as soon as possible for a visit in 1 week(s).   Specialty: Physician Assistant Contact information: [address] [place] [address] [phone]         Poggi, Excell Seltzer, MD. Schedule an appointment as soon as possible for a visit in 1 week(s).   Specialty: Orthopedic Surgery Contact information: [address] [facility], [facility], [facility] [place] [address] [phone]              Contact information for after-discharge care     Destination     [facility] Preferred SNF .   Service: Skilled Nursing Contact information: [address] [place] [address] [phone]                    Allergies  Allergen Reactions   Compazine [Prochlorperazine Edisylate] Other (See Comments)    euphoria   Ivp Dye [Iodinated Diagnostic Agents] Anaphylaxis    "stopped her heart"   Baclofen Other (See Comments)    dizziness   Imitrex [Sumatriptan] Swelling    "throat swelling"   Voltaren [Diclofenac] Other (See Comments)    Cannot hold onto objects (drops things)    Consultations: None.    Procedures/Studies: DG Elbow 2 Views Right  Result Date: 11/05/2020 CLINICAL DATA:  Distal humerus fracture.  Increased pain. EXAM: RIGHT ELBOW - 2 VIEW  COMPARISON:  Right upper radiographs and right elbow CT 10/29/20. FINDINGS: Fracture line the distal humerus again noted. No new fractures are present. Elbow is splinted. IMPRESSION: 1. Stable fracture line through the distal humerus. 2. No new fractures. Electronically Signed   By: Marin Roberts M.D.   On: 11/05/2020 16:02   DG Elbow Complete Right  Result Date: 10/29/2020 CLINICAL DATA:  Fall with elbow pain EXAM: RIGHT ELBOW - COMPLETE 3+ VIEW COMPARISON:  01/14/2019 FINDINGS: Positive for elbow effusion. No radial head dislocation. Acute mildly displaced fracture involving the supracondylar humerus superimposed on underlying chronic fracture deformity. IMPRESSION: Acute mildly displaced supracondylar humerus fracture superimposed on underlying chronic fracture deformity. Associated elbow effusion. Electronically Signed   By: Jasmine Pang M.D.   On: 10/29/2020 18:23   CT Head Wo Contrast  Result Date: 10/29/2020 CLINICAL DATA:  Larey Seat during the night on Monday. Multiple falls recently. Midline neck tenderness. EXAM: CT HEAD WITHOUT CONTRAST CT CERVICAL SPINE WITHOUT CONTRAST TECHNIQUE: Multidetector CT  imaging of the head and cervical spine was performed following the standard protocol without intravenous contrast. Multiplanar CT image reconstructions of the cervical spine were also generated. COMPARISON:  01/14/2019 FINDINGS: CT HEAD FINDINGS Brain: No evidence of acute infarction, hemorrhage, hydrocephalus, extra-axial collection or mass lesion/mass effect. Mild diffuse cerebral atrophy. Patchy low-attenuation changes in the deep white matter consistent with small vessel ischemia. Vascular: Moderate intracranial arterial vascular calcifications. Skull: Postoperative changes in the midline occipital bone. Small craniostomy in the right posterior parietal region. No acute depressed skull fractures identified. Sinuses/Orbits: Paranasal sinuses and mastoid air cells are clear. Other: No significant  change since prior study. CT CERVICAL SPINE FINDINGS Alignment: Reversal of the usual cervical lordosis. Mild retrolisthesis of C4 on C5 and mild anterolisthesis of C6 on C7. Alignment is unchanged since prior study, likely degenerative. Normal alignment of the facet joints. C1-2 articulation appears intact. Skull base and vertebrae: Postoperative changes in the posterior occipital bone with occipital craniotomy and packing material. No change since prior study. No vertebral compression deformities. No focal bone lesion or bone destruction. Soft tissues and spinal canal: No prevertebral soft tissue swelling. No abnormal paraspinal soft tissue mass or infiltration. Disc levels: Degenerative changes throughout with narrowed interspaces and endplate osteophyte formation. Degenerative changes in the facet joints. Upper chest: Lung apices are clear. Other: None. IMPRESSION: 1. No acute intracranial abnormalities. Chronic atrophy and small vessel ischemic changes. Postoperative changes in the skull base. 2. Alignment of the cervical spine is unchanged since prior study consistent with degenerative change. No acute displaced fractures identified. Electronically Signed   By: Burman Nieves M.D.   On: 10/29/2020 20:55   CT Cervical Spine Wo Contrast  Result Date: 10/29/2020 CLINICAL DATA:  Larey Seat during the night on Monday. Multiple falls recently. Midline neck tenderness. EXAM: CT HEAD WITHOUT CONTRAST CT CERVICAL SPINE WITHOUT CONTRAST TECHNIQUE: Multidetector CT imaging of the head and cervical spine was performed following the standard protocol without intravenous contrast. Multiplanar CT image reconstructions of the cervical spine were also generated. COMPARISON:  01/14/2019 FINDINGS: CT HEAD FINDINGS Brain: No evidence of acute infarction, hemorrhage, hydrocephalus, extra-axial collection or mass lesion/mass effect. Mild diffuse cerebral atrophy. Patchy low-attenuation changes in the deep white matter consistent  with small vessel ischemia. Vascular: Moderate intracranial arterial vascular calcifications. Skull: Postoperative changes in the midline occipital bone. Small craniostomy in the right posterior parietal region. No acute depressed skull fractures identified. Sinuses/Orbits: Paranasal sinuses and mastoid air cells are clear. Other: No significant change since prior study. CT CERVICAL SPINE FINDINGS Alignment: Reversal of the usual cervical lordosis. Mild retrolisthesis of C4 on C5 and mild anterolisthesis of C6 on C7. Alignment is unchanged since prior study, likely degenerative. Normal alignment of the facet joints. C1-2 articulation appears intact. Skull base and vertebrae: Postoperative changes in the posterior occipital bone with occipital craniotomy and packing material. No change since prior study. No vertebral compression deformities. No focal bone lesion or bone destruction. Soft tissues and spinal canal: No prevertebral soft tissue swelling. No abnormal paraspinal soft tissue mass or infiltration. Disc levels: Degenerative changes throughout with narrowed interspaces and endplate osteophyte formation. Degenerative changes in the facet joints. Upper chest: Lung apices are clear. Other: None. IMPRESSION: 1. No acute intracranial abnormalities. Chronic atrophy and small vessel ischemic changes. Postoperative changes in the skull base. 2. Alignment of the cervical spine is unchanged since prior study consistent with degenerative change. No acute displaced fractures identified. Electronically Signed   By: Marisa Cyphers.D.  On: 10/29/2020 20:55   CT Elbow Right Wo Contrast  Result Date: 10/29/2020 CLINICAL DATA:  Fracture of elbow. EXAM: CT OF THE LOWER RIGHT EXTREMITY WITHOUT CONTRAST TECHNIQUE: Multidetector CT imaging of the right lower extremity was performed according to the standard protocol. COMPARISON:  Plain film radiographs of the right elbow performed earlier today. FINDINGS:  Bones/Joint/Cartilage Acute supracondylar fracture involving the distal humerus is identified. The fracture fragments are in near anatomic alignment. No signs of dislocation. Remote deformity involving the distal diaphysis of the humerus is again seen. No additional fractures or dislocation identified Ligaments Suboptimally assessed by CT. Muscles and Tendons Unremarkable. Soft tissues Small joint effusion. IMPRESSION: Acute supracondylar fracture involving the distal humerus is identified. The fracture fragments are in near anatomic alignment. Remote distal diaphyseal fracture of the distal humerus. Electronically Signed   By: Signa Kell M.D.   On: 10/29/2020 21:12   DG Chest Portable 1 View  Result Date: 10/29/2020 CLINICAL DATA:  Weakness.  Fall. EXAM: PORTABLE CHEST 1 VIEW COMPARISON:  01/14/2019 FINDINGS: Chronic hyperinflation and bronchial thickening. Normal heart size and mediastinal contours. No pulmonary edema, pneumothorax, or large pleural effusion. No focal airspace disease. Right humeral arthroplasty. Right elbow fractures partially included. There are remote bilateral rib fractures. No acute fracture is seen. IMPRESSION: Chronic hyperinflation and bronchial thickening may be due to asthma or COPD. No acute findings. Electronically Signed   By: Narda Rutherford M.D.   On: 10/29/2020 20:33      Subjective: No chest pain or sob, no nausea or vomiting.   Discharge Exam: Vitals:   11/06/20 0331 11/06/20 0747  BP: (!) 126/92 118/61  Pulse: 75 76  Resp: 18 15  Temp:  98.1 F (36.7 C)  SpO2: 93% 95%   Vitals:   11/05/20 1955 11/05/20 2052 11/06/20 0331 11/06/20 0747  BP: (!) 127/48 (!) 101/59 (!) 126/92 118/61  Pulse: 66  75 76  Resp: Temp: 98.1 F (36.7 C)   98.1 F (36.7 C)  TempSrc:      SpO2: 97%  93% 95%  Weight:      Height:        General: Pt is alert, awake, not in acute distress Cardiovascular: RRR, S1/S2 +, no rubs, no gallops Respiratory: CTA  bilaterally, no wheezing, no rhonchi Abdominal: Soft, NT, ND, bowel sounds + Extremities: no edema, no cyanosis    The results of significant diagnostics from this hospitalization (including imaging, microbiology, ancillary and laboratory) are listed below for reference.     Microbiology: Recent Results (from the past 240 hour(s))  Resp Panel by RT-PCR (Flu A&B, Covid) Nasopharyngeal Swab     Status: None   Collection Time: 10/29/20 10:43 PM   Specimen: Nasopharyngeal Swab; Nasopharyngeal(NP) swabs in vial transport medium  Result Value Ref Range Status   SARS Coronavirus 2 by RT PCR NEGATIVE NEGATIVE Final    Comment: (NOTE) SARS-CoV-2 target nucleic acids are NOT DETECTED.  The SARS-CoV-2 RNA is generally detectable in upper respiratory specimens during the acute phase of infection. The lowest concentration of SARS-CoV-2 viral copies this assay can detect is 138 copies/mL. A negative result does not preclude SARS-Cov-2 infection and should not be used as the sole basis for treatment or other patient management decisions. A negative result may occur with  improper specimen collection/handling, submission of specimen other than nasopharyngeal swab, presence of viral mutation(s) within the areas targeted by this assay, and inadequate number of viral copies(<138 copies/mL). A negative  result must be combined with clinical observations, patient history, and epidemiological information. The expected result is Negative.  Fact Sheet for Patients:  BloggerCourse.comhttps://www.fda.gov/media/152166/download  Fact Sheet for Healthcare Providers:  SeriousBroker.ithttps://www.fda.gov/media/152162/download  This test is no t yet approved or cleared by the [place] FDA and  has been authorized for detection and/or diagnosis of SARS-CoV-2 by FDA under an Emergency Use Authorization (EUA). This EUA will remain  in effect (meaning this test can be used) for the duration of the COVID-19 declaration under Section  564(b)(1) of the Act, 21 U.S.C.section 360bbb-3(b)(1), unless the authorization is terminated  or revoked sooner.       Influenza A by PCR NEGATIVE NEGATIVE Final   Influenza B by PCR NEGATIVE NEGATIVE Final    Comment: (NOTE) The Xpert Xpress SARS-CoV-2/FLU/RSV plus assay is intended as an aid in the diagnosis of influenza from Nasopharyngeal swab specimens and should not be used as a sole basis for treatment. Nasal washings and aspirates are unacceptable for Xpert Xpress SARS-CoV-2/FLU/RSV testing.  Fact Sheet for Patients: BloggerCourse.comhttps://www.fda.gov/media/152166/download  Fact Sheet for Healthcare Providers: SeriousBroker.ithttps://www.fda.gov/media/152162/download  This test is not yet approved or cleared by the [place] FDA and has been authorized for detection and/or diagnosis of SARS-CoV-2 by FDA under an Emergency Use Authorization (EUA). This EUA will remain in effect (meaning this test can be used) for the duration of the COVID-19 declaration under Section 564(b)(1) of the Act, 21 U.S.C. section 360bbb-3(b)(1), unless the authorization is terminated or revoked.  Performed at [facility], [address] [place], [place] [id number]      Labs: BNP (last 3 results) No results for input(s): BNP in the last 8760 hours. Basic Metabolic Panel: Recent Labs  Lab 10/31/20 0711  NA 136  K 3.5  CL 104  CO2 25  GLUCOSE 108*  BUN 9  CREATININE 0.55  CALCIUM 8.6*  MG 1.9   Liver Function Tests: No results for input(s): AST, ALT, ALKPHOS, BILITOT, PROT, ALBUMIN in the last 168 hours. No results for input(s): LIPASE, AMYLASE in the last 168 hours. No results for input(s): AMMONIA in the last 168 hours. CBC: Recent Labs  Lab 10/31/20 0711  WBC 5.3  HGB 12.1  HCT 35.3*  MCV 117.7*  PLT 167   Cardiac Enzymes: No results for input(s): CKTOTAL, CKMB, CKMBINDEX, TROPONINI in the last 168 hours. BNP: Invalid input(s): POCBNP CBG: No results for input(s): GLUCAP in the  last 168 hours. D-Dimer No results for input(s): DDIMER in the last 72 hours. Hgb A1c No results for input(s): HGBA1C in the last 72 hours. Lipid Profile No results for input(s): CHOL, HDL, LDLCALC, TRIG, CHOLHDL, LDLDIRECT in the last 72 hours. Thyroid function studies No results for input(s): TSH, T4TOTAL, T3FREE, THYROIDAB in the last 72 hours.  Invalid input(s): FREET3 Anemia work up No results for input(s): VITAMINB12, FOLATE, FERRITIN, TIBC, IRON, RETICCTPCT in the last 72 hours. Urinalysis    Component Value Date/Time   COLORURINE YELLOW (A) 10/29/2020 1633   APPEARANCEUR HAZY (A) 10/29/2020 1633   APPEARANCEUR Hazy 10/20/2013 1305   LABSPEC 1.020 10/29/2020 1633   LABSPEC 1.017 10/20/2013 1305   PHURINE 5.0 10/29/2020 1633   GLUCOSEU NEGATIVE 10/29/2020 1633   GLUCOSEU Negative 10/20/2013 1305   HGBUR NEGATIVE 10/29/2020 1633   BILIRUBINUR NEGATIVE 10/29/2020 1633   BILIRUBINUR Negative 10/20/2013 1305   KETONESUR 20 (A) 10/29/2020 1633   PROTEINUR 30 (A) 10/29/2020 1633   NITRITE NEGATIVE 10/29/2020 1633   LEUKOCYTESUR TRACE (A) 10/29/2020 1633  LEUKOCYTESUR Trace 10/20/2013 1305   Sepsis Labs Invalid input(s): PROCALCITONIN,  WBC,  LACTICIDVEN Microbiology Recent Results (from the past 240 hour(s))  Resp Panel by RT-PCR (Flu A&B, Covid) Nasopharyngeal Swab     Status: None   Collection Time: 10/29/20 10:43 PM   Specimen: Nasopharyngeal Swab; Nasopharyngeal(NP) swabs in vial transport medium  Result Value Ref Range Status   SARS Coronavirus 2 by RT PCR NEGATIVE NEGATIVE Final    Comment: (NOTE) SARS-CoV-2 target nucleic acids are NOT DETECTED.  The SARS-CoV-2 RNA is generally detectable in upper respiratory specimens during the acute phase of infection. The lowest concentration of SARS-CoV-2 viral copies this assay can detect is 138 copies/mL. A negative result does not preclude SARS-Cov-2 infection and should not be used as the sole basis for treatment  or other patient management decisions. A negative result may occur with  improper specimen collection/handling, submission of specimen other than nasopharyngeal swab, presence of viral mutation(s) within the areas targeted by this assay, and inadequate number of viral copies(<138 copies/mL). A negative result must be combined with clinical observations, patient history, and epidemiological information. The expected result is Negative.  Fact Sheet for Patients:  BloggerCourse.com  Fact Sheet for Healthcare Providers:  SeriousBroker.it  This test is no t yet approved or cleared by the [place] FDA and  has been authorized for detection and/or diagnosis of SARS-CoV-2 by FDA under an Emergency Use Authorization (EUA). This EUA will remain  in effect (meaning this test can be used) for the duration of the COVID-19 declaration under Section 564(b)(1) of the Act, 21 U.S.C.section 360bbb-3(b)(1), unless the authorization is terminated  or revoked sooner.       Influenza A by PCR NEGATIVE NEGATIVE Final   Influenza B by PCR NEGATIVE NEGATIVE Final    Comment: (NOTE) The Xpert Xpress SARS-CoV-2/FLU/RSV plus assay is intended as an aid in the diagnosis of influenza from Nasopharyngeal swab specimens and should not be used as a sole basis for treatment. Nasal washings and aspirates are unacceptable for Xpert Xpress SARS-CoV-2/FLU/RSV testing.  Fact Sheet for Patients: BloggerCourse.com  Fact Sheet for Healthcare Providers: SeriousBroker.it  This test is not yet approved or cleared by the [place] FDA and has been authorized for detection and/or diagnosis of SARS-CoV-2 by FDA under an Emergency Use Authorization (EUA). This EUA will remain in effect (meaning this test can be used) for the duration of the COVID-19 declaration under Section 564(b)(1) of the Act, 21 U.S.C. section  360bbb-3(b)(1), unless the authorization is terminated or revoked.  Performed at [facility], [address], [place], [place] [address]      Time coordinating discharge: 36 minutes.   SIGNED:   Kathlen Mody, MD  [facility] 11/06/2020, 11:23 AM

## 2020-11-06 NOTE — Care Management Important Message (Signed)
Important Message  Patient Details  Name: Ariel White MRN: [id number] Date of Birth: [birth year]   Medicare Important Message Given:  Yes     Olegario Messier A Chrissy Ealey 11/06/2020, 10:30 AM

## 2020-11-06 NOTE — Plan of Care (Signed)
Pt ready for d/c

## 2022-02-19 ENCOUNTER — Other Ambulatory Visit: Payer: Self-pay | Admitting: Student

## 2022-02-19 DIAGNOSIS — Z1231 Encounter for screening mammogram for malignant neoplasm of breast: Secondary | ICD-10-CM

## 2022-05-01 ENCOUNTER — Emergency Department: Payer: 59

## 2022-05-01 ENCOUNTER — Inpatient Hospital Stay
Admission: EM | Admit: 2022-05-01 | Discharge: 2022-05-27 | DRG: 853 | Disposition: E | Payer: 59 | Attending: Internal Medicine | Admitting: Internal Medicine

## 2022-05-01 DIAGNOSIS — D696 Thrombocytopenia, unspecified: Secondary | ICD-10-CM | POA: Diagnosis present

## 2022-05-01 DIAGNOSIS — E876 Hypokalemia: Secondary | ICD-10-CM | POA: Diagnosis present

## 2022-05-01 DIAGNOSIS — K922 Gastrointestinal hemorrhage, unspecified: Secondary | ICD-10-CM | POA: Diagnosis present

## 2022-05-01 DIAGNOSIS — F1010 Alcohol abuse, uncomplicated: Secondary | ICD-10-CM | POA: Diagnosis present

## 2022-05-01 DIAGNOSIS — I700 Atherosclerosis of aorta: Secondary | ICD-10-CM | POA: Diagnosis present

## 2022-05-01 DIAGNOSIS — K921 Melena: Secondary | ICD-10-CM | POA: Diagnosis present

## 2022-05-01 DIAGNOSIS — K7200 Acute and subacute hepatic failure without coma: Secondary | ICD-10-CM

## 2022-05-01 DIAGNOSIS — M1990 Unspecified osteoarthritis, unspecified site: Secondary | ICD-10-CM | POA: Diagnosis present

## 2022-05-01 DIAGNOSIS — D65 Disseminated intravascular coagulation [defibrination syndrome]: Secondary | ICD-10-CM | POA: Diagnosis not present

## 2022-05-01 DIAGNOSIS — G43909 Migraine, unspecified, not intractable, without status migrainosus: Secondary | ICD-10-CM | POA: Diagnosis present

## 2022-05-01 DIAGNOSIS — Z515 Encounter for palliative care: Secondary | ICD-10-CM

## 2022-05-01 DIAGNOSIS — M797 Fibromyalgia: Secondary | ICD-10-CM | POA: Diagnosis present

## 2022-05-01 DIAGNOSIS — R6521 Severe sepsis with septic shock: Secondary | ICD-10-CM | POA: Diagnosis present

## 2022-05-01 DIAGNOSIS — R791 Abnormal coagulation profile: Secondary | ICD-10-CM | POA: Diagnosis present

## 2022-05-01 DIAGNOSIS — G40909 Epilepsy, unspecified, not intractable, without status epilepticus: Secondary | ICD-10-CM | POA: Diagnosis present

## 2022-05-01 DIAGNOSIS — L309 Dermatitis, unspecified: Secondary | ICD-10-CM | POA: Diagnosis present

## 2022-05-01 DIAGNOSIS — J9601 Acute respiratory failure with hypoxia: Secondary | ICD-10-CM | POA: Diagnosis present

## 2022-05-01 DIAGNOSIS — N170 Acute kidney failure with tubular necrosis: Secondary | ICD-10-CM | POA: Diagnosis present

## 2022-05-01 DIAGNOSIS — K760 Fatty (change of) liver, not elsewhere classified: Secondary | ICD-10-CM | POA: Diagnosis present

## 2022-05-01 DIAGNOSIS — A4151 Sepsis due to Escherichia coli [E. coli]: Secondary | ICD-10-CM | POA: Diagnosis not present

## 2022-05-01 DIAGNOSIS — G8929 Other chronic pain: Secondary | ICD-10-CM | POA: Diagnosis present

## 2022-05-01 DIAGNOSIS — J9602 Acute respiratory failure with hypercapnia: Secondary | ICD-10-CM | POA: Diagnosis not present

## 2022-05-01 DIAGNOSIS — R230 Cyanosis: Secondary | ICD-10-CM | POA: Diagnosis not present

## 2022-05-01 DIAGNOSIS — F1014 Alcohol abuse with alcohol-induced mood disorder: Secondary | ICD-10-CM | POA: Diagnosis present

## 2022-05-01 DIAGNOSIS — G928 Other toxic encephalopathy: Secondary | ICD-10-CM | POA: Diagnosis present

## 2022-05-01 DIAGNOSIS — R188 Other ascites: Secondary | ICD-10-CM | POA: Diagnosis present

## 2022-05-01 DIAGNOSIS — R571 Hypovolemic shock: Secondary | ICD-10-CM | POA: Diagnosis present

## 2022-05-01 DIAGNOSIS — N136 Pyonephrosis: Secondary | ICD-10-CM | POA: Diagnosis present

## 2022-05-01 DIAGNOSIS — T391X1A Poisoning by 4-Aminophenol derivatives, accidental (unintentional), initial encounter: Secondary | ICD-10-CM | POA: Diagnosis present

## 2022-05-01 DIAGNOSIS — E162 Hypoglycemia, unspecified: Secondary | ICD-10-CM | POA: Diagnosis present

## 2022-05-01 DIAGNOSIS — N179 Acute kidney failure, unspecified: Secondary | ICD-10-CM

## 2022-05-01 DIAGNOSIS — F17210 Nicotine dependence, cigarettes, uncomplicated: Secondary | ICD-10-CM | POA: Diagnosis present

## 2022-05-01 DIAGNOSIS — A419 Sepsis, unspecified organism: Secondary | ICD-10-CM

## 2022-05-01 DIAGNOSIS — Z638 Other specified problems related to primary support group: Secondary | ICD-10-CM

## 2022-05-01 DIAGNOSIS — Z1152 Encounter for screening for COVID-19: Secondary | ICD-10-CM

## 2022-05-01 DIAGNOSIS — Z66 Do not resuscitate: Secondary | ICD-10-CM | POA: Diagnosis not present

## 2022-05-01 DIAGNOSIS — S0083XA Contusion of other part of head, initial encounter: Secondary | ICD-10-CM | POA: Diagnosis present

## 2022-05-01 DIAGNOSIS — F319 Bipolar disorder, unspecified: Secondary | ICD-10-CM | POA: Diagnosis present

## 2022-05-01 DIAGNOSIS — N201 Calculus of ureter: Secondary | ICD-10-CM | POA: Diagnosis present

## 2022-05-01 DIAGNOSIS — E871 Hypo-osmolality and hyponatremia: Secondary | ICD-10-CM | POA: Diagnosis present

## 2022-05-01 DIAGNOSIS — K219 Gastro-esophageal reflux disease without esophagitis: Secondary | ICD-10-CM | POA: Diagnosis present

## 2022-05-01 DIAGNOSIS — Z79899 Other long term (current) drug therapy: Secondary | ICD-10-CM

## 2022-05-01 DIAGNOSIS — R578 Other shock: Secondary | ICD-10-CM | POA: Diagnosis present

## 2022-05-01 DIAGNOSIS — R7881 Bacteremia: Secondary | ICD-10-CM

## 2022-05-01 DIAGNOSIS — E872 Acidosis, unspecified: Secondary | ICD-10-CM | POA: Diagnosis present

## 2022-05-01 DIAGNOSIS — Z803 Family history of malignant neoplasm of breast: Secondary | ICD-10-CM

## 2022-05-01 DIAGNOSIS — B9620 Unspecified Escherichia coli [E. coli] as the cause of diseases classified elsewhere: Secondary | ICD-10-CM

## 2022-05-01 DIAGNOSIS — F419 Anxiety disorder, unspecified: Secondary | ICD-10-CM | POA: Diagnosis present

## 2022-05-01 DIAGNOSIS — F1410 Cocaine abuse, uncomplicated: Secondary | ICD-10-CM | POA: Diagnosis present

## 2022-05-01 DIAGNOSIS — T68XXXA Hypothermia, initial encounter: Secondary | ICD-10-CM | POA: Diagnosis present

## 2022-05-01 DIAGNOSIS — W19XXXA Unspecified fall, initial encounter: Secondary | ICD-10-CM | POA: Diagnosis present

## 2022-05-01 LAB — LACTIC ACID, PLASMA: Lactic Acid, Venous: >9 mmol/L (ref 0.5–1.9)

## 2022-05-01 LAB — RESP PANEL BY RT-PCR (RSV, FLU A&B, COVID)  RVPGX2
Influenza A by PCR: NEGATIVE
Influenza B by PCR: NEGATIVE
Resp Syncytial Virus by PCR: NEGATIVE
SARS Coronavirus 2 by RT PCR: NEGATIVE

## 2022-05-01 LAB — COMPREHENSIVE METABOLIC PANEL
ALT: 252 U/L — ABNORMAL HIGH (ref 0–44)
AST: 1205 U/L — ABNORMAL HIGH (ref 15–41)
Albumin: 3.2 g/dL — ABNORMAL LOW (ref 3.5–5.0)
Alkaline Phosphatase: 114 U/L (ref 38–126)
Anion gap: 23 — ABNORMAL HIGH (ref 5–15)
BUN: 32 mg/dL — ABNORMAL HIGH (ref 6–20)
CO2: 13 mmol/L — ABNORMAL LOW (ref 22–32)
Calcium: 8.2 mg/dL — ABNORMAL LOW (ref 8.9–10.3)
Chloride: 92 mmol/L — ABNORMAL LOW (ref 98–111)
Creatinine, Ser: 1.85 mg/dL — ABNORMAL HIGH (ref 0.44–1.00)
GFR, Estimated: 31 mL/min — ABNORMAL LOW (ref >60)
Glucose, Bld: 85 mg/dL (ref 70–99)
Potassium: 3.1 mmol/L — ABNORMAL LOW (ref 3.5–5.1)
Sodium: 128 mmol/L — ABNORMAL LOW (ref 135–145)
Total Bilirubin: 1.6 mg/dL — ABNORMAL HIGH (ref 0.3–1.2)
Total Protein: 6.4 g/dL — ABNORMAL LOW (ref 6.5–8.1)

## 2022-05-01 LAB — CBC WITH DIFFERENTIAL/PLATELET
Abs Immature Granulocytes: 1.16 10*3/uL — ABNORMAL HIGH (ref 0.00–0.07)
Basophils Absolute: 0.1 10*3/uL (ref 0.0–0.1)
Basophils Relative: 1 %
Eosinophils Absolute: 0 10*3/uL (ref 0.0–0.5)
Eosinophils Relative: 0 %
HCT: 40.2 % (ref 36.0–46.0)
Hemoglobin: 13.1 g/dL (ref 12.0–15.0)
Immature Granulocytes: 10 %
Lymphocytes Relative: 5 %
Lymphs Abs: 0.5 10*3/uL — ABNORMAL LOW (ref 0.7–4.0)
MCH: 37.9 pg — ABNORMAL HIGH (ref 26.0–34.0)
MCHC: 32.6 g/dL (ref 30.0–36.0)
MCV: 116.2 fL — ABNORMAL HIGH (ref 80.0–100.0)
Monocytes Absolute: 0.5 10*3/uL (ref 0.1–1.0)
Monocytes Relative: 5 %
Neutro Abs: 9.4 10*3/uL — ABNORMAL HIGH (ref 1.7–7.7)
Neutrophils Relative %: 79 %
Platelets: 102 10*3/uL — ABNORMAL LOW (ref 150–400)
RBC: 3.46 MIL/uL — ABNORMAL LOW (ref 3.87–5.11)
RDW: 13.6 % (ref 11.5–15.5)
Smear Review: NORMAL
WBC: 11.7 10*3/uL — ABNORMAL HIGH (ref 4.0–10.5)
nRBC: 0 % (ref 0.0–0.2)

## 2022-05-01 MED ORDER — PANTOPRAZOLE SODIUM 40 MG IV SOLR
40.0000 mg | Freq: Once | INTRAVENOUS | Status: AC
  Administered 2022-05-01: 40 mg via INTRAVENOUS
  Filled 2022-05-01: qty 10

## 2022-05-01 MED ORDER — ONDANSETRON HCL 4 MG/2ML IJ SOLN
4.0000 mg | Freq: Once | INTRAMUSCULAR | Status: AC
  Administered 2022-05-01: 4 mg via INTRAVENOUS
  Filled 2022-05-01: qty 2

## 2022-05-01 MED ORDER — LACTATED RINGERS IV SOLN: INTRAVENOUS | Status: AC

## 2022-05-01 MED ORDER — SODIUM CHLORIDE 0.9 % IV BOLUS (SEPSIS)
250.0000 mL | Freq: Once | INTRAVENOUS | Status: AC
  Administered 2022-05-01: 250 mL via INTRAVENOUS

## 2022-05-01 MED ORDER — SODIUM CHLORIDE 0.9 % IV SOLN
12.5000 mg | Freq: Four times a day (QID) | INTRAVENOUS | Status: DC | PRN
  Administered 2022-05-02: 12.5 mg via INTRAVENOUS
  Filled 2022-05-01: qty 12.5

## 2022-05-01 MED ORDER — METRONIDAZOLE 500 MG/100ML IV SOLN
500.0000 mg | Freq: Once | INTRAVENOUS | Status: AC
  Administered 2022-05-01: 500 mg via INTRAVENOUS
  Filled 2022-05-01: qty 100

## 2022-05-01 MED ORDER — SODIUM CHLORIDE 0.9 % IV BOLUS (SEPSIS)
1000.0000 mL | Freq: Once | INTRAVENOUS | Status: AC
  Administered 2022-05-01: 1000 mL via INTRAVENOUS

## 2022-05-01 MED ORDER — SODIUM CHLORIDE 0.9 % IV BOLUS (SEPSIS)
500.0000 mL | Freq: Once | INTRAVENOUS | Status: AC
  Administered 2022-05-01: 500 mL via INTRAVENOUS

## 2022-05-01 MED ORDER — SODIUM CHLORIDE 0.9 % IV SOLN
2.0000 g | Freq: Once | INTRAVENOUS | Status: AC
  Administered 2022-05-01: 2 g via INTRAVENOUS
  Filled 2022-05-01: qty 12.5

## 2022-05-01 MED ORDER — SODIUM CHLORIDE 0.9 % IV SOLN
10.0000 mL/h | Freq: Once | INTRAVENOUS | Status: AC
  Administered 2022-05-01: 10 mL/h via INTRAVENOUS

## 2022-05-01 NOTE — ED Triage Notes (Signed)
62 y/o F from home via [place]. Pt initially called for difficulty breathing, generalized weakness and dark tarry stools X3 days. Pt denies blood thinner treatment. Pt denies oral intake X3days. VS w/ EMS BP 55/36, P 114, RR23, SPO2 98% onRA

## 2022-05-01 NOTE — ED Provider Notes (Signed)
[facility] Provider Note    Event Date/Time   First MD Initiated Contact with Patient May 11, 2022 2229     (approximate)   History   Chief Complaint: Weakness   HPI  Ariel White is a 62 y.o. female with a past history of seizures, GERD, bipolar disorder who comes the ED by EMS due to generalized weakness, black runny stool for the past 3 days.  Also felt increasingly short of breath today.  Not on blood thinners.  Initial blood pressure 55/36.  Patient endorses upper abdominal pain and nausea.     Physical Exam   Triage Vital Signs: ED Triage Vitals  Enc Vitals Group     BP 05/11/22 2218 (!) 61/34     Pulse Rate 2022/05/11 2218 (!) 115     Resp --      Temp --      Temp src --      SpO2 May 11, 2022 2218 97 %     Weight 05/11/2022 2221 120 lb (54.4 kg)     Height 05/11/2022 2221 5\' 4"  (1.626 m)     Head Circumference --      Peak Flow --      Pain Score --      Pain Loc --      Pain Edu? --      Excl. in [place]? --     Most recent vital signs: Vitals:   05-11-2022 2218 05/11/2022 2230  BP: (!) 61/34 (!) 98/35  Pulse: (!) 115   Resp:  18  SpO2: 97%     General: Awake, ill-appearing, in extremis CV:  Thready peripheral pulses, symmetric in bilateral radial and dorsalis pedis.  Pale.  Subconjunctival pallor.  Tachycardia heart rate 115. Resp:  Normal effort.  Clear to auscultation bilaterally Abd:  No distention.  Soft with left upper quadrant and epigastric tenderness.  Rectal exam reveals minimal stool, light brown, Hemoccult positive Other:  Dry mucous membranes.   ED Results / Procedures / Treatments   Labs (all labs ordered are listed, but only abnormal results are displayed) Labs Reviewed  CBC WITH DIFFERENTIAL/PLATELET - Abnormal; Notable for the following components:      Result Value   WBC 11.7 (*)    RBC 3.46 (*)    MCV 116.2 (*)    MCH 37.9 (*)    Platelets 102 (*)    All other components within normal limits  RESP PANEL BY RT-PCR  (RSV, FLU A&B, COVID)  RVPGX2  CULTURE, BLOOD (ROUTINE X 2)  CULTURE, BLOOD (ROUTINE X 2)  URINE CULTURE  LACTIC ACID, PLASMA  LACTIC ACID, PLASMA  COMPREHENSIVE METABOLIC PANEL  PROTIME-INR  APTT  URINALYSIS, COMPLETE (UACMP) WITH MICROSCOPIC  ACETAMINOPHEN LEVEL  ETHANOL  SALICYLATE LEVEL  URINE DRUG SCREEN, QUALITATIVE ([facility] ONLY)  POC URINE PREG, ED  PREPARE RBC (CROSSMATCH)     EKG Interpreted by me Sinus tachycardia rate 115.  Normal axis, grossly prolonged QTc interval.  Poor R wave progression.  Normal ST segments and T waves, no acute ischemic changes.   RADIOLOGY Chest x-ray interpreted by me, appears normal.  Radiology report reviewed.   PROCEDURES:  .Critical Care  Performed by: Carrie Mew, MD Authorized by: Carrie Mew, MD   Critical care provider statement:    Critical care time (minutes):  35   Critical care time was exclusive of:  Separately billable procedures and treating other patients   Critical care was necessary to treat or prevent imminent  or life-threatening deterioration of the following conditions:  Sepsis, shock and circulatory failure   Critical care was time spent personally by me on the following activities:  Development of treatment plan with patient or surrogate, discussions with consultants, evaluation of patient's response to treatment, examination of patient, obtaining history from patient or surrogate, ordering and performing treatments and interventions, ordering and review of laboratory studies, ordering and review of radiographic studies, pulse oximetry, re-evaluation of patient's condition and review of old charts   Care discussed with: admitting provider   Comments:        .1-3 Lead EKG Interpretation  Performed by: Sharman Cheek, MD Authorized by: Sharman Cheek, MD     Interpretation: abnormal     ECG rate:  115   ECG rate assessment: tachycardic     Rhythm: sinus tachycardia     Ectopy: none      Conduction: normal      MEDICATIONS ORDERED IN ED: Medications  lactated ringers infusion (has no administration in time range)  sodium chloride 0.9 % bolus 1,000 mL (has no administration in time range)    And  sodium chloride 0.9 % bolus 500 mL (has no administration in time range)    And  sodium chloride 0.9 % bolus 250 mL (has no administration in time range)  ceFEPIme (MAXIPIME) 2 g in sodium chloride 0.9 % 100 mL IVPB (has no administration in time range)  metroNIDAZOLE (FLAGYL) IVPB 500 mg (has no administration in time range)  0.9 %  sodium chloride infusion (has no administration in time range)  pantoprazole (PROTONIX) injection 40 mg (40 mg Intravenous Given 05/15/2022 2241)  ondansetron (ZOFRAN) injection 4 mg (4 mg Intravenous Given 04/30/2022 2242)     IMPRESSION / MDM / ASSESSMENT AND PLAN / ED COURSE  I reviewed the triage vital signs and the nursing notes.                              Differential diagnosis includes, but is not limited to, septic shock, diverticulitis, pancreatitis, biliary disease, GI perforation, bowel obstruction, appendicitis, perforated gastric ulcer, UTI, GI bleed, hemorrhagic shock  Patient's presentation is most consistent with acute presentation with potential threat to life or bodily function.  Patient arrives in the ED by EMS in shock with tachycardia and profound hypotension.  5 peripheral IVs placed on arrival.  Fluid resuscitation with 2 L normal saline and 2 units of emergency release packed red cells being administered.  Will give Zofran and Protonix.  Will obtain labs, chest x-ray, cultures, CT abdomen.  Will need to admit for further stabilization.       FINAL CLINICAL IMPRESSION(S) / ED DIAGNOSES   Final diagnoses:  Acute GI bleeding  Hemorrhagic shock (HCC)     Rx / DC Orders   ED Discharge Orders     None        Note:  This document was prepared using Dragon voice recognition software and may include unintentional  dictation errors.   Sharman Cheek, MD 04/26/2022 2249

## 2022-05-01 NOTE — Progress Notes (Signed)
Pt being followed by [facility] for Sepsis protocol. 

## 2022-05-01 NOTE — Progress Notes (Signed)
PHARMACY -  BRIEF ANTIBIOTIC NOTE   Pharmacy has received consult(s) for Cefepime from an ED provider.  The patient's profile has been reviewed for ht/wt/allergies/indication/available labs.    One time order(s) placed for Cefepime 2 gm IV X 1   Further antibiotics/pharmacy consults should be ordered by admitting physician if indicated.                       Thank you, Sreeja Spies D 05/13/2022  10:34 PM

## 2022-05-02 ENCOUNTER — Emergency Department: Payer: 59

## 2022-05-02 ENCOUNTER — Encounter: Payer: Self-pay | Admitting: Radiology

## 2022-05-02 ENCOUNTER — Encounter: Admission: EM | Disposition: E | Payer: Self-pay | Source: Home / Self Care | Attending: Internal Medicine

## 2022-05-02 ENCOUNTER — Inpatient Hospital Stay: Payer: 59 | Admitting: Anesthesiology

## 2022-05-02 ENCOUNTER — Inpatient Hospital Stay: Payer: 59

## 2022-05-02 DIAGNOSIS — K7200 Acute and subacute hepatic failure without coma: Secondary | ICD-10-CM

## 2022-05-02 DIAGNOSIS — E872 Acidosis, unspecified: Secondary | ICD-10-CM

## 2022-05-02 DIAGNOSIS — J9601 Acute respiratory failure with hypoxia: Secondary | ICD-10-CM | POA: Diagnosis present

## 2022-05-02 DIAGNOSIS — R7881 Bacteremia: Secondary | ICD-10-CM

## 2022-05-02 DIAGNOSIS — K922 Gastrointestinal hemorrhage, unspecified: Secondary | ICD-10-CM

## 2022-05-02 DIAGNOSIS — E871 Hypo-osmolality and hyponatremia: Secondary | ICD-10-CM | POA: Diagnosis present

## 2022-05-02 DIAGNOSIS — Z66 Do not resuscitate: Secondary | ICD-10-CM | POA: Diagnosis not present

## 2022-05-02 DIAGNOSIS — R6521 Severe sepsis with septic shock: Secondary | ICD-10-CM | POA: Diagnosis present

## 2022-05-02 DIAGNOSIS — N179 Acute kidney failure, unspecified: Secondary | ICD-10-CM

## 2022-05-02 DIAGNOSIS — K760 Fatty (change of) liver, not elsewhere classified: Secondary | ICD-10-CM | POA: Diagnosis present

## 2022-05-02 DIAGNOSIS — A4151 Sepsis due to Escherichia coli [E. coli]: Secondary | ICD-10-CM | POA: Diagnosis present

## 2022-05-02 DIAGNOSIS — F1410 Cocaine abuse, uncomplicated: Secondary | ICD-10-CM | POA: Diagnosis present

## 2022-05-02 DIAGNOSIS — N136 Pyonephrosis: Secondary | ICD-10-CM | POA: Diagnosis present

## 2022-05-02 DIAGNOSIS — B9620 Unspecified Escherichia coli [E. coli] as the cause of diseases classified elsewhere: Secondary | ICD-10-CM

## 2022-05-02 DIAGNOSIS — D696 Thrombocytopenia, unspecified: Secondary | ICD-10-CM | POA: Diagnosis present

## 2022-05-02 DIAGNOSIS — J9602 Acute respiratory failure with hypercapnia: Secondary | ICD-10-CM | POA: Diagnosis not present

## 2022-05-02 DIAGNOSIS — G928 Other toxic encephalopathy: Secondary | ICD-10-CM | POA: Diagnosis present

## 2022-05-02 DIAGNOSIS — A419 Sepsis, unspecified organism: Secondary | ICD-10-CM

## 2022-05-02 DIAGNOSIS — D65 Disseminated intravascular coagulation [defibrination syndrome]: Secondary | ICD-10-CM | POA: Diagnosis not present

## 2022-05-02 DIAGNOSIS — F1014 Alcohol abuse with alcohol-induced mood disorder: Secondary | ICD-10-CM | POA: Diagnosis present

## 2022-05-02 DIAGNOSIS — F1010 Alcohol abuse, uncomplicated: Secondary | ICD-10-CM | POA: Diagnosis present

## 2022-05-02 DIAGNOSIS — G40909 Epilepsy, unspecified, not intractable, without status epilepticus: Secondary | ICD-10-CM | POA: Diagnosis present

## 2022-05-02 DIAGNOSIS — I700 Atherosclerosis of aorta: Secondary | ICD-10-CM | POA: Diagnosis present

## 2022-05-02 DIAGNOSIS — R579 Shock, unspecified: Secondary | ICD-10-CM

## 2022-05-02 DIAGNOSIS — Z515 Encounter for palliative care: Secondary | ICD-10-CM | POA: Diagnosis not present

## 2022-05-02 DIAGNOSIS — F319 Bipolar disorder, unspecified: Secondary | ICD-10-CM | POA: Diagnosis present

## 2022-05-02 DIAGNOSIS — N201 Calculus of ureter: Secondary | ICD-10-CM | POA: Diagnosis present

## 2022-05-02 DIAGNOSIS — W19XXXA Unspecified fall, initial encounter: Secondary | ICD-10-CM | POA: Diagnosis present

## 2022-05-02 DIAGNOSIS — N170 Acute kidney failure with tubular necrosis: Secondary | ICD-10-CM | POA: Diagnosis present

## 2022-05-02 DIAGNOSIS — R188 Other ascites: Secondary | ICD-10-CM | POA: Diagnosis present

## 2022-05-02 DIAGNOSIS — Z1152 Encounter for screening for COVID-19: Secondary | ICD-10-CM | POA: Diagnosis not present

## 2022-05-02 HISTORY — PX: CYSTOSCOPY W/ URETERAL STENT PLACEMENT: SHX1429

## 2022-05-02 LAB — URINALYSIS, COMPLETE (UACMP) WITH MICROSCOPIC
Bilirubin Urine: NEGATIVE
Glucose, UA: NEGATIVE mg/dL
Ketones, ur: NEGATIVE mg/dL
Nitrite: NEGATIVE
Protein, ur: 100 mg/dL — AB
Specific Gravity, Urine: 1.013 (ref 1.005–1.030)
pH: 5 (ref 5.0–8.0)

## 2022-05-02 LAB — GLUCOSE, CAPILLARY
Glucose-Capillary: 81 mg/dL (ref 70–99)
Glucose-Capillary: 200 mg/dL — ABNORMAL HIGH (ref 70–99)
Glucose-Capillary: 62 mg/dL — ABNORMAL LOW (ref 70–99)
Glucose-Capillary: 186 mg/dL — ABNORMAL HIGH (ref 70–99)
Glucose-Capillary: 182 mg/dL — ABNORMAL HIGH (ref 70–99)
Glucose-Capillary: 147 mg/dL — ABNORMAL HIGH (ref 70–99)
Glucose-Capillary: 237 mg/dL — ABNORMAL HIGH (ref 70–99)

## 2022-05-02 LAB — BLOOD GAS, VENOUS
Acid-base deficit: 21.2 mmol/L — ABNORMAL HIGH (ref 0.0–2.0)
Bicarbonate: 7.9 mmol/L — ABNORMAL LOW (ref 20.0–28.0)
O2 Saturation: 94.8 %
Patient temperature: 37
pCO2, Ven: 28 mmHg — ABNORMAL LOW (ref 44–60)
pH, Ven: 7.06 — CL (ref 7.25–7.43)
pO2, Ven: 77 mmHg — ABNORMAL HIGH (ref 32–45)

## 2022-05-02 LAB — COMPREHENSIVE METABOLIC PANEL
ALT: 315 U/L — ABNORMAL HIGH (ref 0–44)
AST: 1616 U/L — ABNORMAL HIGH (ref 15–41)
Albumin: 2.2 g/dL — ABNORMAL LOW (ref 3.5–5.0)
Alkaline Phosphatase: 98 U/L (ref 38–126)
BUN: 23 mg/dL — ABNORMAL HIGH (ref 6–20)
CO2: <7 mmol/L — ABNORMAL LOW (ref 22–32)
Calcium: 6.6 mg/dL — ABNORMAL LOW (ref 8.9–10.3)
Chloride: 105 mmol/L (ref 98–111)
Creatinine, Ser: 1.13 mg/dL — ABNORMAL HIGH (ref 0.44–1.00)
GFR, Estimated: 56 mL/min — ABNORMAL LOW (ref >60)
Glucose, Bld: 63 mg/dL — ABNORMAL LOW (ref 70–99)
Potassium: 4.1 mmol/L (ref 3.5–5.1)
Sodium: 134 mmol/L — ABNORMAL LOW (ref 135–145)
Total Bilirubin: 1.5 mg/dL — ABNORMAL HIGH (ref 0.3–1.2)
Total Protein: 4.7 g/dL — ABNORMAL LOW (ref 6.5–8.1)

## 2022-05-02 LAB — BLOOD GAS, ARTERIAL
Acid-base deficit: 26.5 mmol/L — ABNORMAL HIGH (ref 0.0–2.0)
Acid-base deficit: 19 mmol/L — ABNORMAL HIGH (ref 0.0–2.0)
Bicarbonate: 4.8 mmol/L — ABNORMAL LOW (ref 20.0–28.0)
Bicarbonate: 7.8 mmol/L — ABNORMAL LOW (ref 20.0–28.0)
FIO2: 60 %
FIO2: 35 %
MECHVT: 450 mL
Mechanical Rate: 26
O2 Saturation: 100 %
O2 Saturation: 99.7 %
PEEP: 5 cmH2O
PEEP: 5 cmH2O
Patient temperature: 34.7
Patient temperature: 36.5
RATE: 24 resp/min
Spontaneous VT: 400 mL
pCO2 arterial: 21 mmHg — ABNORMAL LOW (ref 32–48)
pCO2 arterial: 22 mmHg — ABNORMAL LOW (ref 32–48)
pH, Arterial: 6.96 — CL (ref 7.35–7.45)
pH, Arterial: 7.17 — CL (ref 7.35–7.45)
pO2, Arterial: 235 mmHg — ABNORMAL HIGH (ref 83–108)
pO2, Arterial: 143 mmHg — ABNORMAL HIGH (ref 83–108)

## 2022-05-02 LAB — LACTIC ACID, PLASMA
Lactic Acid, Venous: >9 mmol/L (ref 0.5–1.9)
Lactic Acid, Venous: >9 mmol/L (ref 0.5–1.9)

## 2022-05-02 LAB — TYPE AND SCREEN
ABO/RH(D): O POS
Antibody Screen: NEGATIVE
Unit division: 0
Unit division: 0

## 2022-05-02 LAB — BPAM RBC
Blood Product Expiration Date: 202401232359
Blood Product Expiration Date: 202401262359
ISSUE DATE / TIME: 202401062225
ISSUE DATE / TIME: 202401062225
Unit Type and Rh: 5100
Unit Type and Rh: 5100

## 2022-05-02 LAB — TROPONIN I (HIGH SENSITIVITY)
Troponin I (High Sensitivity): 73 ng/L — ABNORMAL HIGH (ref <18)
Troponin I (High Sensitivity): 111 ng/L (ref <18)
Troponin I (High Sensitivity): 120 ng/L (ref <18)
Troponin I (High Sensitivity): 128 ng/L (ref <18)
Troponin I (High Sensitivity): 148 ng/L (ref <18)

## 2022-05-02 LAB — BLOOD CULTURE ID PANEL (REFLEXED) - BCID2

## 2022-05-02 LAB — CK: Total CK: 45 U/L (ref 38–234)

## 2022-05-02 LAB — URINE DRUG SCREEN, QUALITATIVE (ARMC ONLY)
Amphetamines, Ur Screen: NOT DETECTED
Barbiturates, Ur Screen: NOT DETECTED
Benzodiazepine, Ur Scrn: NOT DETECTED
Cannabinoid 50 Ng, Ur ~~LOC~~: NOT DETECTED
Cocaine Metabolite,Ur ~~LOC~~: NOT DETECTED
MDMA (Ecstasy)Ur Screen: NOT DETECTED
Methadone Scn, Ur: NOT DETECTED
Opiate, Ur Screen: NOT DETECTED
Phencyclidine (PCP) Ur S: NOT DETECTED
Tricyclic, Ur Screen: NOT DETECTED

## 2022-05-02 LAB — PROTIME-INR
INR: 1.9 — ABNORMAL HIGH (ref 0.8–1.2)
Prothrombin Time: 21.6 seconds — ABNORMAL HIGH (ref 11.4–15.2)

## 2022-05-02 LAB — LIPASE, BLOOD: Lipase: 25 U/L (ref 11–51)

## 2022-05-02 LAB — PROCALCITONIN: Procalcitonin: 28.4 ng/mL

## 2022-05-02 LAB — APTT: aPTT: 43 seconds — ABNORMAL HIGH (ref 24–36)

## 2022-05-02 LAB — AMMONIA: Ammonia: 34 umol/L (ref 9–35)

## 2022-05-02 LAB — MAGNESIUM: Magnesium: 1.5 mg/dL — ABNORMAL LOW (ref 1.7–2.4)

## 2022-05-02 LAB — ETHANOL
Alcohol, Ethyl (B): <10 mg/dL (ref <10)
Alcohol, Ethyl (B): <10 mg/dL (ref <10)

## 2022-05-02 LAB — SALICYLATE LEVEL: Salicylate Lvl: <7 mg/dL — ABNORMAL LOW (ref 7.0–30.0)

## 2022-05-02 LAB — AMYLASE: Amylase: 29 U/L (ref 28–100)

## 2022-05-02 LAB — PREPARE RBC (CROSSMATCH)

## 2022-05-02 LAB — POC URINE PREG, ED: Preg Test, Ur: NEGATIVE

## 2022-05-02 LAB — HEPATITIS PANEL, ACUTE
HCV Ab: NONREACTIVE
Hep A IgM: NONREACTIVE
Hep B C IgM: NONREACTIVE
Hepatitis B Surface Ag: NONREACTIVE

## 2022-05-02 LAB — PHOSPHORUS: Phosphorus: 4.2 mg/dL (ref 2.5–4.6)

## 2022-05-02 LAB — HIV ANTIBODY (ROUTINE TESTING W REFLEX): HIV Screen 4th Generation wRfx: NONREACTIVE

## 2022-05-02 LAB — MRSA NEXT GEN BY PCR, NASAL: MRSA by PCR Next Gen: NOT DETECTED

## 2022-05-02 LAB — ACETAMINOPHEN LEVEL: Acetaminophen (Tylenol), Serum: 93 ug/mL — ABNORMAL HIGH (ref 10–30)

## 2022-05-02 SURGERY — CYSTOSCOPY, WITH RETROGRADE PYELOGRAM AND URETERAL STENT INSERTION
Anesthesia: Choice | Laterality: Left

## 2022-05-02 SURGERY — CYSTOSCOPY, WITH RETROGRADE PYELOGRAM AND URETERAL STENT INSERTION
Anesthesia: General | Laterality: Left

## 2022-05-02 MED ORDER — SUCCINYLCHOLINE CHLORIDE 200 MG/10ML IV SOSY
PREFILLED_SYRINGE | INTRAVENOUS | Status: AC
  Filled 2022-05-02: qty 10

## 2022-05-02 MED ORDER — MAGNESIUM SULFATE 2 GM/50ML IV SOLN
2.0000 g | Freq: Once | INTRAVENOUS | Status: AC
  Administered 2022-05-02: 2 g via INTRAVENOUS
  Filled 2022-05-02: qty 50

## 2022-05-02 MED ORDER — SUCCINYLCHOLINE CHLORIDE 200 MG/10ML IV SOSY
PREFILLED_SYRINGE | INTRAVENOUS | Status: AC
  Administered 2022-05-02: 50 mg via INTRAVENOUS
  Filled 2022-05-02: qty 10

## 2022-05-02 MED ORDER — NOREPINEPHRINE 4 MG/250ML-% IV SOLN
INTRAVENOUS | Status: AC
  Administered 2022-05-02: 2 ug/min via INTRAVENOUS
  Filled 2022-05-02: qty 250

## 2022-05-02 MED ORDER — FENTANYL CITRATE PF 50 MCG/ML IJ SOSY: 50.0000 ug | PREFILLED_SYRINGE | INTRAMUSCULAR | Status: DC | PRN

## 2022-05-02 MED ORDER — LORAZEPAM 2 MG/ML IJ SOLN
INTRAMUSCULAR | Status: AC
  Administered 2022-05-02: 0.5 mg via INTRAVENOUS
  Filled 2022-05-02: qty 1

## 2022-05-02 MED ORDER — POTASSIUM CHLORIDE 10 MEQ/100ML IV SOLN
10.0000 meq | INTRAVENOUS | Status: AC
  Administered 2022-05-02: 10 meq via INTRAVENOUS
  Filled 2022-05-02 (×2): qty 100

## 2022-05-02 MED ORDER — PIPERACILLIN-TAZOBACTAM 3.375 G IVPB
3.3750 g | Freq: Three times a day (TID) | INTRAVENOUS | Status: DC
  Administered 2022-05-02 – 2022-05-03 (×4): 3.375 g via INTRAVENOUS
  Filled 2022-05-02 (×4): qty 50

## 2022-05-02 MED ORDER — MIDAZOLAM HCL 2 MG/2ML IJ SOLN
1.0000 mg | INTRAMUSCULAR | Status: DC | PRN
  Administered 2022-05-02 – 2022-05-04 (×7): 2 mg via INTRAVENOUS
  Filled 2022-05-02 (×7): qty 2

## 2022-05-02 MED ORDER — ACETYLCYSTEINE LOAD VIA INFUSION
150.0000 mg/kg | Freq: Once | INTRAVENOUS | Status: AC
  Administered 2022-05-02: 8160 mg via INTRAVENOUS
  Filled 2022-05-02: qty 268

## 2022-05-02 MED ORDER — POTASSIUM CHLORIDE CRYS ER 20 MEQ PO TBCR
40.0000 meq | EXTENDED_RELEASE_TABLET | Freq: Once | ORAL | Status: AC
  Administered 2022-05-02: 40 meq via ORAL
  Filled 2022-05-02: qty 2

## 2022-05-02 MED ORDER — VANCOMYCIN HCL 750 MG/150ML IV SOLN
750.0000 mg | INTRAVENOUS | Status: DC
  Filled 2022-05-02: qty 150

## 2022-05-02 MED ORDER — PANTOPRAZOLE SODIUM 40 MG IV SOLR: 40.0000 mg | INTRAVENOUS | Status: DC

## 2022-05-02 MED ORDER — PANTOPRAZOLE SODIUM 40 MG IV SOLR
40.0000 mg | Freq: Two times a day (BID) | INTRAVENOUS | Status: DC
  Administered 2022-05-02 – 2022-05-03 (×3): 40 mg via INTRAVENOUS
  Filled 2022-05-02 (×3): qty 10

## 2022-05-02 MED ORDER — THIAMINE HCL 100 MG/ML IJ SOLN
100.0000 mg | Freq: Every day | INTRAMUSCULAR | Status: DC
  Administered 2022-05-02 – 2022-05-03 (×2): 100 mg via INTRAVENOUS
  Filled 2022-05-02 (×2): qty 2

## 2022-05-02 MED ORDER — FOLIC ACID 5 MG/ML IJ SOLN
1.0000 mg | Freq: Every day | INTRAMUSCULAR | Status: DC
  Administered 2022-05-02 – 2022-05-03 (×2): 1 mg via INTRAVENOUS
  Filled 2022-05-02 (×3): qty 0.2

## 2022-05-02 MED ORDER — NOREPINEPHRINE 16 MG/250ML-% IV SOLN
0.0000 ug/min | INTRAVENOUS | Status: DC
  Administered 2022-05-02: 36 ug/min via INTRAVENOUS
  Administered 2022-05-02: 37 ug/min via INTRAVENOUS
  Administered 2022-05-03: 10 ug/min via INTRAVENOUS
  Administered 2022-05-03: 34 ug/min via INTRAVENOUS
  Filled 2022-05-02 (×5): qty 250

## 2022-05-02 MED ORDER — MIDAZOLAM HCL 2 MG/2ML IJ SOLN
INTRAMUSCULAR | Status: AC
  Filled 2022-05-02: qty 2

## 2022-05-02 MED ORDER — PHENYLEPHRINE CONCENTRATED 100MG/250ML (0.4 MG/ML) INFUSION SIMPLE
0.0000 ug/min | INTRAVENOUS | Status: DC
  Administered 2022-05-02: 20 ug/min via INTRAVENOUS
  Filled 2022-05-02: qty 250

## 2022-05-02 MED ORDER — LORAZEPAM 2 MG/ML IJ SOLN: 0.5000 mg | Freq: Once | INTRAMUSCULAR | Status: AC

## 2022-05-02 MED ORDER — POLYETHYLENE GLYCOL 3350 17 G PO PACK: 17.0000 g | PACK | Freq: Every day | ORAL | Status: DC

## 2022-05-02 MED ORDER — DOCUSATE SODIUM 50 MG/5ML PO LIQD
100.0000 mg | Freq: Two times a day (BID) | ORAL | Status: DC
  Administered 2022-05-02: 100 mg
  Filled 2022-05-02: qty 10

## 2022-05-02 MED ORDER — MIDAZOLAM HCL 2 MG/2ML IJ SOLN
INTRAMUSCULAR | Status: DC | PRN
  Administered 2022-05-02 (×2): 2 mg via INTRAVENOUS

## 2022-05-02 MED ORDER — PROPOFOL 1000 MG/100ML IV EMUL
INTRAVENOUS | Status: AC
  Filled 2022-05-02: qty 100

## 2022-05-02 MED ORDER — INSULIN ASPART 100 UNIT/ML IJ SOLN
0.0000 [IU] | INTRAMUSCULAR | Status: DC
  Administered 2022-05-03: 7 [IU] via SUBCUTANEOUS
  Administered 2022-05-03: 15 [IU] via SUBCUTANEOUS
  Filled 2022-05-02 (×2): qty 1

## 2022-05-02 MED ORDER — ORAL CARE MOUTH RINSE: 15.0000 mL | OROMUCOSAL | Status: DC | PRN

## 2022-05-02 MED ORDER — DEXTROSE 50 % IV SOLN
INTRAVENOUS | Status: AC
  Administered 2022-05-02: 50 mL
  Filled 2022-05-02: qty 50

## 2022-05-02 MED ORDER — FENTANYL CITRATE (PF) 100 MCG/2ML IJ SOLN
INTRAMUSCULAR | Status: AC
  Filled 2022-05-02: qty 2

## 2022-05-02 MED ORDER — SODIUM BICARBONATE 8.4 % IV SOLN
INTRAVENOUS | Status: DC
  Filled 2022-05-02: qty 1000
  Filled 2022-05-02: qty 150
  Filled 2022-05-02 (×2): qty 1000
  Filled 2022-05-02 (×2): qty 150

## 2022-05-02 MED ORDER — LIDOCAINE HCL (PF) 2 % IJ SOLN
INTRAMUSCULAR | Status: AC
  Filled 2022-05-02: qty 5

## 2022-05-02 MED ORDER — PHENYLEPHRINE HCL (PRESSORS) 10 MG/ML IV SOLN
INTRAVENOUS | Status: DC | PRN
  Administered 2022-05-02: 160 ug via INTRAVENOUS

## 2022-05-02 MED ORDER — SODIUM CHLORIDE 0.9 % IV SOLN
15.0000 mg/kg | Freq: Once | INTRAVENOUS | Status: AC
  Administered 2022-05-02: 820 mg via INTRAVENOUS
  Filled 2022-05-02: qty 0.82

## 2022-05-02 MED ORDER — DOCUSATE SODIUM 100 MG PO CAPS: 100.0000 mg | ORAL_CAPSULE | Freq: Two times a day (BID) | ORAL | Status: DC | PRN

## 2022-05-02 MED ORDER — POLYETHYLENE GLYCOL 3350 17 G PO PACK: 17.0000 g | PACK | Freq: Every day | ORAL | Status: DC | PRN

## 2022-05-02 MED ORDER — FENTANYL BOLUS VIA INFUSION
50.0000 ug | INTRAVENOUS | Status: DC | PRN
  Administered 2022-05-02: 100 ug via INTRAVENOUS
  Administered 2022-05-02: 50 ug via INTRAVENOUS
  Administered 2022-05-03 (×3): 100 ug via INTRAVENOUS
  Administered 2022-05-03 (×2): 50 ug via INTRAVENOUS
  Administered 2022-05-03 – 2022-05-04 (×3): 100 ug via INTRAVENOUS

## 2022-05-02 MED ORDER — NOREPINEPHRINE 4 MG/250ML-% IV SOLN: 2.0000 ug/min | INTRAVENOUS | Status: DC

## 2022-05-02 MED ORDER — GADOBUTROL 1 MMOL/ML IV SOLN
5.0000 mL | Freq: Once | INTRAVENOUS | Status: AC | PRN
  Administered 2022-05-02: 5 mL via INTRAVENOUS

## 2022-05-02 MED ORDER — ORAL CARE MOUTH RINSE
15.0000 mL | OROMUCOSAL | Status: DC
  Administered 2022-05-02 – 2022-05-04 (×21): 15 mL via OROMUCOSAL

## 2022-05-02 MED ORDER — SODIUM CHLORIDE 0.9 % IR SOLN
Status: DC | PRN
  Administered 2022-05-02: 3000 mL via INTRAVESICAL

## 2022-05-02 MED ORDER — CHLORHEXIDINE GLUCONATE CLOTH 2 % EX PADS
6.0000 | MEDICATED_PAD | Freq: Every day | CUTANEOUS | Status: DC
  Administered 2022-05-02 – 2022-05-03 (×2): 6 via TOPICAL

## 2022-05-02 MED ORDER — IOHEXOL 300 MG/ML  SOLN
INTRAMUSCULAR | Status: DC | PRN
  Administered 2022-05-02: 10 mL

## 2022-05-02 MED ORDER — DEXTROSE 5 % IV SOLN
15.0000 mg/kg/h | INTRAVENOUS | Status: DC
  Administered 2022-05-02 – 2022-05-03 (×2): 15 mg/kg/h via INTRAVENOUS
  Filled 2022-05-02 (×4): qty 90

## 2022-05-02 MED ORDER — FENTANYL CITRATE PF 50 MCG/ML IJ SOSY
PREFILLED_SYRINGE | INTRAMUSCULAR | Status: AC
  Filled 2022-05-02: qty 1

## 2022-05-02 MED ORDER — SODIUM CHLORIDE 0.9 % IV SOLN: 250.0000 mL | INTRAVENOUS | Status: DC

## 2022-05-02 MED ORDER — VASOPRESSIN 20 UNITS/100 ML INFUSION FOR SHOCK
0.0000 [IU]/min | INTRAVENOUS | Status: DC
  Administered 2022-05-02 – 2022-05-03 (×2): 0.03 [IU]/min via INTRAVENOUS
  Filled 2022-05-02 (×3): qty 100

## 2022-05-02 MED ORDER — LACTATED RINGERS IV BOLUS
500.0000 mL | Freq: Once | INTRAVENOUS | Status: AC
  Administered 2022-05-02: 500 mL via INTRAVENOUS

## 2022-05-02 MED ORDER — SODIUM CHLORIDE 0.9 % IV BOLUS
1000.0000 mL | Freq: Once | INTRAVENOUS | Status: AC
  Administered 2022-05-02: 1000 mL via INTRAVENOUS

## 2022-05-02 MED ORDER — DEXAMETHASONE SODIUM PHOSPHATE 10 MG/ML IJ SOLN
INTRAMUSCULAR | Status: AC
  Filled 2022-05-02: qty 1

## 2022-05-02 MED ORDER — ONDANSETRON HCL 4 MG/2ML IJ SOLN
INTRAMUSCULAR | Status: AC
  Filled 2022-05-02: qty 2

## 2022-05-02 MED ORDER — HYDROCORTISONE SOD SUC (PF) 100 MG IJ SOLR
50.0000 mg | Freq: Three times a day (TID) | INTRAMUSCULAR | Status: DC
  Administered 2022-05-02 – 2022-05-04 (×5): 50 mg via INTRAVENOUS
  Filled 2022-05-02 (×5): qty 2

## 2022-05-02 MED ORDER — ETOMIDATE 2 MG/ML IV SOLN: 10.0000 mg | Freq: Once | INTRAVENOUS | Status: AC

## 2022-05-02 MED ORDER — FENTANYL CITRATE PF 50 MCG/ML IJ SOSY
50.0000 ug | PREFILLED_SYRINGE | Freq: Once | INTRAMUSCULAR | Status: AC
  Administered 2022-05-02: 50 ug via INTRAVENOUS
  Filled 2022-05-02: qty 1

## 2022-05-02 MED ORDER — ETOMIDATE 2 MG/ML IV SOLN
INTRAVENOUS | Status: AC
  Administered 2022-05-02: 10 mg via INTRAVENOUS
  Filled 2022-05-02: qty 10

## 2022-05-02 MED ORDER — FENTANYL 2500MCG IN NS 250ML (10MCG/ML) PREMIX INFUSION
50.0000 ug/h | INTRAVENOUS | Status: DC
  Administered 2022-05-02: 50 ug/h via INTRAVENOUS
  Administered 2022-05-03: 200 ug/h via INTRAVENOUS
  Administered 2022-05-03: 150 ug/h via INTRAVENOUS
  Administered 2022-05-04: 200 ug/h via INTRAVENOUS
  Filled 2022-05-02 (×3): qty 250

## 2022-05-02 MED ORDER — SUCCINYLCHOLINE CHLORIDE 200 MG/10ML IV SOSY: 50.0000 mg | PREFILLED_SYRINGE | Freq: Once | INTRAVENOUS | Status: AC

## 2022-05-02 MED ORDER — VANCOMYCIN HCL 1250 MG/250ML IV SOLN
1250.0000 mg | Freq: Once | INTRAVENOUS | Status: AC
  Administered 2022-05-02: 1250 mg via INTRAVENOUS
  Filled 2022-05-02: qty 250

## 2022-05-02 MED ORDER — ADULT MULTIVITAMIN LIQUID CH
15.0000 mL | Freq: Every day | ORAL | Status: DC
  Administered 2022-05-02 – 2022-05-03 (×2): 15 mL
  Filled 2022-05-02 (×3): qty 15

## 2022-05-02 SURGICAL SUPPLY — 20 items
BAG DRN LRG CPC RND TRDRP CNTR (MISCELLANEOUS) ×1
BAG URO DRAIN 4000ML (MISCELLANEOUS) ×1 IMPLANT
BASKET ZERO TIP 1.9FR (BASKET) ×1 IMPLANT
BSKT STON RTRVL ZERO TP 1.9FR (BASKET)
CATH URETL OPEN 5X70 (CATHETERS) IMPLANT
DRSG TEGADERM 2-3/8X2-3/4 SM (GAUZE/BANDAGES/DRESSINGS) ×1 IMPLANT
EXTRACTOR STONE 1.7FRX115CM (UROLOGICAL SUPPLIES) IMPLANT
GLOVE BIO SURGEON STRL SZ 6.5 (GLOVE) ×1 IMPLANT
GOWN STRL REUS W/ TWL LRG LVL3 (GOWN DISPOSABLE) ×1 IMPLANT
GOWN STRL REUS W/TWL LRG LVL3 (GOWN DISPOSABLE) ×1
GUIDEWIRE SENSOR ANG DUAL FLEX (WIRE) ×1 IMPLANT
GUIDEWIRE STR DUAL SENSOR (WIRE) ×1 IMPLANT
MANIFOLD NEPTUNE II (INSTRUMENTS) ×2 IMPLANT
PACK CYSTO (CUSTOM PROCEDURE TRAY) ×1 IMPLANT
SET CYSTO W/LG BORE CLAMP LF (SET/KITS/TRAYS/PACK) ×1 IMPLANT
SHEATH NAVIGATOR HD 12/14X36 (SHEATH) IMPLANT
TRAP FLUID SMOKE EVACUATOR (MISCELLANEOUS) ×1 IMPLANT
TUBING CONNECTING 10 (TUBING) ×1 IMPLANT
WATER STERILE IRR 500ML POUR (IV SOLUTION) ×1 IMPLANT
WIRE COONS/BENSON .038X145CM (WIRE) IMPLANT

## 2022-05-02 NOTE — ED Notes (Signed)
50 mcg of fent given pt now being transported with all staff to ICU

## 2022-05-02 NOTE — Anesthesia Postprocedure Evaluation (Signed)
Anesthesia Post Note  Patient: ETTER ROYALL  Procedure(s) Performed: CYSTOSCOPY WITH RETROGRADE PYELOGRAM/URETERAL STENT PLACEMENT (Left)  Patient location during evaluation: ICU Anesthesia Type: General Level of consciousness: sedated and patient remains intubated per anesthesia plan Pain management: pain level controlled Vital Signs Assessment: vitals unstable Respiratory status: patient remains intubated per anesthesia plan and patient on ventilator - see flowsheet for VS Cardiovascular status: tachycardic and blood pressure returned to baseline Anesthetic complications: no   No notable events documented.   Last Vitals:  Vitals:   05/11/2022 1615 05/11/2022 1630  BP: 124/65 (!) 124/98  Pulse: (!) 133 (!) 133  Resp: (!) 26 (!) 26  Temp:  (!) 36.2 C  SpO2: 99% 99%    Last Pain:  Vitals:   05/11/2022 1630  TempSrc: Esophageal                 Darrin Nipper

## 2022-05-02 NOTE — ED Notes (Signed)
Pt transported to CT at this time.

## 2022-05-02 NOTE — Op Note (Signed)
Preoperative diagnosis:  Left obstructing ureteral calculus Sepsis with e. Coli bacteremia   Postoperative diagnosis:  Same  Procedure:  Cystoscopy left ureteral stent placement left retrograde pyelography with interpretation   Surgeon: Jacalyn Lefevre, MD  Anesthesia: General  Complications: None  Intraoperative findings:  Normal urethra B/l orthotopic Uos Left retrograde pyelogram done only to visualize renal pelvis, no filling defect noted, mild hydronephrosis  EBL: Minimal  Specimens: None  Indication: Ariel White is a 62 y.o. patient 79mm left obstructing ureteral calculus with e. coli bacteremia in setting of septic shock. After reviewing the management options for treatment, he elected to proceed with the above surgical procedure(s). We have discussed the potential benefits and risks of the procedure, side effects of the proposed treatment, the likelihood of the patient achieving the goals of the procedure, and any potential problems that might occur during the procedure or recuperation. Informed consent has been obtained.  Description of procedure:  The patient was taken to the operating room and general anesthesia was induced.  The patient was placed in the dorsal lithotomy position, prepped and draped in the usual sterile fashion, and preoperative antibiotics were administered. A preoperative time-out was performed.   Cystourethroscopy was performed.  The patient's urethra was examined and was normal. The bladder was then systematically examined in its entirety. There was no evidence for any bladder tumors, stones, or other mucosal pathology.    Attention then turned to the leftureteral orifice and a ureteral catheter was used to intubate the ureteral orifice.  Omnipaque contrast was injected through the ureteral catheter and a retrograde pyelogram was performed with findings as dictated above.  A 0.38 sensor guidewire was then advanced up the left ureter into the  renal pelvis under fluoroscopic guidance.  The wire was then backloaded through the cystoscope and a ureteral stent was advance over the wire using Seldinger technique.  The stent was positioned appropriately under fluoroscopic and cystoscopic guidance.  The wire was then removed with an adequate stent curl noted in the renal pelvis as well as in the bladder.  The bladder was then emptied and the procedure ended.  The patient appeared to tolerate the procedure well and without complications.  The patient was able to be awakened and transferred to the recovery unit in satisfactory condition.    Jacalyn Lefevre, M.D.

## 2022-05-02 NOTE — IPAL (Signed)
  Interdisciplinary Goals of Care Family Meeting   Date carried out: 05/13/2022  Location of the meeting: Bedside  Member's involved: Physician and Family Member or next of kin  Durable Power of Attorney or acting medical decision maker: Sister Ariel White.    Discussion: We discussed goals of care for Ariel White .  Discussed with patient's sister the overall medical condition. She has developed septic shock, liver failure, bacteremia, and acute kidney injury overall consistent with multiorgan failure. Sister understands the gravity of the patient's medical condition. Patient is estranged from her son who cannot be reached. Will continue with full aggressive care. Patient at imminent risk of dying and the sister understands she could have a cardiac arrest at any point.  Code status:   Code Status: Full Code   Disposition: Continue current acute care  Time spent for the meeting: 83 minutes    Armando Reichert, MD  05/01/2022, 3:00 PM

## 2022-05-02 NOTE — Consult Note (Signed)
SURGICAL CONSULTATION NOTE   HISTORY OF PRESENT ILLNESS (HPI):  62 y.o. female presented to [facility] ED for evaluation of weakness.  Upon evaluation patient was sedated, on mechanical ventilation.  No family available at bedside.  History taken from ICU physician and chart.  Patient with onset of weakness, shortness of breath, abdominal pain and bloody bowel movements for the last 3 days.  As per EMS and her sister the patient has not eating well in the last few days.  She has known history of alcohol abuse and as per sister she takes Tylenol frequently.  At the ED she was found hypotensive, hypothermic and tachycardic.  She was placed on supplemental oxygen.  She got aggressive IV fluid resuscitation and 2 units of PRBCs.  Patient was found with hyponatremia, acute kidney injury, hyperkalemia, lactic acidosis with lactic acid of above 9.  White blood cell 11.7.  Hemoglobin 13.  Platelet 102.  Acetaminophen level were 93.  INR of 1.9.  AST 1205, ALT 252.  Total bilirubin of 1.6.  Albumin of 3.2  Patient was transferred to the ICU.  Due to respiratory failure she was sedated and placed on mechanical ventilation.  She is currently out of pressors.  She is receiving treatment for intoxication of acetaminophen.  She has CT scan of the abdomen and pelvis.  This shows gallbladder wall thickening.  This led to abdominal ultrasound which shows gallbladder wall thickening but without any sign of gallstones.  She also had MRCP due to elevated liver enzymes and bilirubin there was no stones but there is persistent gallbladder with thickening.  Present evaluated the images of the CT scan of the abdomen, abdominal ultrasound and MRCP.  Surgery is consulted by Dr. Aundria Rud in this context for evaluation and management of gallbladder wall thickening.  PAST MEDICAL HISTORY (PMH):  Past Medical History:  Diagnosis Date   Anxiety    Arthritis    Bipolar 1 disorder (HCC)    Chronic pain    Depression    Eczema     Fibromyalgia    GERD (gastroesophageal reflux disease)    Headache    migraines   History of hiatal hernia    Neuromuscular disorder (HCC)    Seizures (HCC)      PAST SURGICAL HISTORY (PSH):  Past Surgical History:  Procedure Laterality Date   ABDOMINAL HYSTERECTOMY     APPENDECTOMY     BRAIN TUMOR EXCISION  1985   SHOULDER HEMI-ARTHROPLASTY Right 11/14/2018   Procedure: SHOULDER HEMI-ARTHROPLASTY;  Surgeon: Christena Flake, MD;  Location: [facility] ORS;  Service: Orthopedics;  Laterality: Right;   TUBAL LIGATION       MEDICATIONS:  Prior to Admission medications   Medication Sig Start Date End Date Taking? Authorizing Provider  atorvastatin (LIPITOR) 20 MG tablet Take 20 mg by mouth daily. 09/16/21 09/16/22  [provider]  diphenhydramine-acetaminophen (TYLENOL PM) 25-500 MG TABS tablet Take 2 tablets by mouth at bedtime as needed (sleep).     [provider]  folic acid (FOLVITE) 1 MG tablet Take 1 tablet (1 mg total) by mouth daily. 11/07/20   Kathlen Mody, MD  lamoTRIgine (LAMICTAL) 100 MG tablet Take 100 mg by mouth 2 (two) times daily. 09/19/20   [provider]  Multiple Vitamin (MULTIVITAMIN WITH MINERALS) TABS tablet Take 1 tablet by mouth daily. 11/07/20   Kathlen Mody, MD  OLANZapine (ZYPREXA) 2.5 MG tablet Take 1 tablet (2.5 mg total) by mouth 2 (two) times daily. Patient taking differently: Take  2.5 mg by mouth every evening. 01/22/19   Burnadette Pop, MD  pantoprazole (PROTONIX) 20 MG tablet Take 20 mg by mouth. 02/17/22   [provider]  propranolol (INDERAL) 40 MG tablet Take 40 mg by mouth 2 (two) times daily. 08/29/20   [provider]  senna-docusate (SENOKOT-S) 8.6-50 MG tablet Take 1 tablet by mouth at bedtime as needed for mild constipation. 11/06/20   Kathlen Mody, MD  thiamine 100 MG tablet Take 1 tablet (100 mg total) by mouth daily. 11/07/20   Kathlen Mody, MD  traZODone (DESYREL) 150 MG tablet Take 150 mg by mouth at  bedtime. 08/13/20   [provider]     ALLERGIES:  Allergies  Allergen Reactions   Compazine [Prochlorperazine Edisylate] Other (See Comments)    euphoria   Ivp Dye [Iodinated Contrast Media] Anaphylaxis    "stopped her heart"   Baclofen Other (See Comments)    dizziness   Imitrex [Sumatriptan] Swelling    "throat swelling"   Voltaren [Diclofenac] Other (See Comments)    Cannot hold onto objects (drops things)     SOCIAL HISTORY:  Social History   Socioeconomic History   Marital status: Divorced    Spouse name: Not on file   Number of children: Not on file   Years of education: Not on file   Highest education level: Not on file  Occupational History   Not on file  Tobacco Use   Smoking status: Every Day    Packs/day: 0.25    Types: Cigarettes   Smokeless tobacco: Never  Vaping Use   Vaping Use: Never used  Substance and Sexual Activity   Alcohol use: Yes    Alcohol/week: 2.0 standard drinks of alcohol    Types: 2 Cans of beer per week    Comment: once per month   Drug use: Never    Comment: 3 TO 5 YEARS   Sexual activity: Not on file  Other Topics Concern   Not on file  Social History Narrative   Not on file   Social Determinants of Health   Financial Resource Strain: Not on file  Food Insecurity: Not on file  Transportation Needs: Not on file  Physical Activity: Not on file  Stress: Not on file  Social Connections: Not on file  Intimate Partner Violence: Not on file      FAMILY HISTORY:  Family History  Problem Relation Age of Onset   Breast cancer Mother        31's     REVIEW OF SYSTEMS:  Constitutional: denies weight loss, fever, chills, or sweats.  Positive for weakness Eyes: denies any other vision changes, history of eye injury  ENT: denies sore throat, hearing problems  Respiratory: Positive shortness of breath Cardiovascular: denies chest pain, palpitations  Gastrointestinal: abdominal pain, nausea and  vomiting Genitourinary: denies burning with urination or urinary frequency Musculoskeletal: denies any other joint pains or cramps  Skin: denies any other rashes or skin discolorations  Neurological: denies any other headache, dizziness, weakness  Psychiatric: Positive for depression, anxiety   All other review of systems were negative   VITAL SIGNS:  Temp:  [93.4 F (34.1 C)-97.6 F (36.4 C)] 93.6 F (34.2 C) (01/07 0900) Pulse Rate:  [104-125] 118 (01/07 0900) Resp:  [15-28] 23 (01/07 0900) BP: (61-194)/(31-163) 135/70 (01/07 0900) SpO2:  [91 %-100 %] 100 % (01/07 0900) FiO2 (%):  [60 %] 60 % (01/07 0814) Weight:  [54.4 kg-55 kg] 55 kg (01/07 0857)  Height: 5\' 4"  (162.6 cm) Weight: 55 kg BMI (Calculated): 20.8   INTAKE/OUTPUT:  This shift: Total I/O In: 1810.2 [I.V.:762.8; IV Piggyback:1047.4] Out: 0   Last 2 shifts: @IOLAST2SHIFTS @   PHYSICAL EXAM:  Constitutional:  -- Normal body habitus  -- Critically ill, sedated on mechanical ventilation Eyes:  -- Pupils equally round and reactive to light  -- No scleral icterus  Ear, nose, and throat:  -- No jugular venous distension  Pulmonary:  -- No crackles  -- Equal breath sounds bilaterally -- Breathing non-labored at rest Cardiovascular:  -- S1, S2 present  -- No pericardial rubs Gastrointestinal:  -- Abdomen soft, distended -- No abdominal masses appreciated, pulsatile or otherwise  Musculoskeletal and Integumentary:  -- Wounds: None appreciated -- Extremities: B/L UE and LE FROM, hands and feet warm, no edema  Neurologic:  -- Sedated, mechanical ventilation.   Labs:     Latest Ref Rng & Units 04/26/2022   10:32 PM 10/31/2020    7:11 AM 10/30/2020    6:57 AM  CBC  WBC 4.0 - 10.5 K/uL 11.7  5.3  5.8   Hemoglobin 12.0 - 15.0 g/dL 13.1  12.1  12.2   Hematocrit 36.0 - 46.0 % 40.2  35.3  35.3   Platelets 150 - 400 K/uL 102  167  186       Latest Ref Rng & Units 04/26/2022    6:14 AM 05/21/2022   10:32 PM  10/31/2020    7:11 AM  CMP  Glucose 70 - 99 mg/dL 63  85  108   BUN 6 - 20 mg/dL 23  32  9   Creatinine 0.44 - 1.00 mg/dL 1.13  1.85  0.55   Sodium 135 - 145 mmol/L 134  128  136   Potassium 3.5 - 5.1 mmol/L 4.1  3.1  3.5   Chloride 98 - 111 mmol/L 105  92  104   CO2 22 - 32 mmol/L 7  13  25    Calcium 8.9 - 10.3 mg/dL 6.6  8.2  8.6   Total Protein 6.5 - 8.1 g/dL 4.7  6.4    Total Bilirubin 0.3 - 1.2 mg/dL 1.5  1.6    Alkaline Phos 38 - 126 U/L 98  114    AST 15 - 41 U/L 1,616  1,205    ALT 0 - 44 U/L 315  252     Imaging studies:  EXAM: MRI ABDOMEN WITHOUT AND WITH CONTRAST (INCLUDING MRCP)   TECHNIQUE: Multiplanar multisequence MR imaging of the abdomen was performed both before and after the administration of intravenous contrast. Heavily T2-weighted images of the biliary and pancreatic ducts were obtained, and three-dimensional MRCP images were rendered by post processing.   CONTRAST:  28mL GADAVIST GADOBUTROL 1 MMOL/ML IV SOLN   COMPARISON:  CT and ultrasound exams from earlier the same day   FINDINGS: Lower chest: Small bilateral effusions.   Hepatobiliary: Liver measures 17.6 cm craniocaudal length, enlarged. Diffuse loss of signal intensity in the liver parenchyma on out of phase T1 imaging is compatible with fatty deposition. Imaging of the liver is markedly motion degraded and smaller subtle lesions could easily be obscured by the artifact. Within this limitation no gross hepatic mass lesion is discernible. No substantial intrahepatic biliary duct dilatation.   As noted on the prior studies, there is marked edema and wall thickening of the gallbladder wall with a nondistended lumen. As noted, postcontrast imaging is markedly degraded, but no discrete enhancing mass lesion is  discernible within the gallbladder although the mucosa is diffusely hyperenhancing. No gallstones are identified. Common bile duct is nondilated at 3 mm diameter. No evidence of  choledocholithiasis.   Pancreas: Pancreas appears to have some edema in the parenchyma with associated edema in the retroperitoneal tissues. Imaging of the pancreas is nondiagnostic on pre and postcontrast T1 imaging due to motion artifact   Spleen:  No splenomegaly. No focal mass lesion.   Adrenals/Urinary Tract: No adrenal nodule or mass. Kidneys unremarkable.   Stomach/Bowel: Stomach is moderately distended with gas and fluid. Duodenal wall appears thick with edema. Small bowel loops in the visualized upper abdomen appear edematous.   Vascular/Lymphatic: No abdominal aortic aneurysm. Portal vein and superior mesenteric vein show normal flow signal characteristics compatible with patency. Splenic vein shows diffuse enhancement after contrast administration suggesting patency.   Other: There is extensive edema around the gallbladder, in the hepatoduodenal ligament and, and in the retroperitoneal space. Small volume free fluid is seen around the liver and spleen with retroperitoneal edema/fluid noted bilaterally.   Musculoskeletal: No focal suspicious marrow enhancement within the visualized bony anatomy.   IMPRESSION: 1. Marked edema and wall thickening of the gallbladder wall with a nondistended lumen. No evidence for gallstones an no discrete enhancing mass lesion is discernible within the gallbladder although the mucosa is diffusely hyperenhancing after contrast administration. Gallbladder wall thickening/edema is considered nonspecific in this study given no discernible gallstones and can be seen in the setting of infection/inflammation, volume overload, liver failure, and heart failure. 2. No substantial intrahepatic or extrahepatic biliary duct dilatation. No evidence of choledocholithiasis. 3. Pancreas appears to have some edema in the parenchyma with associated edema in the retroperitoneal tissues. Imaging of the pancreas is nondiagnostic on pre and postcontrast T1  imaging due to motion artifact. 4. Fairly extensive edema in the gallbladder fossa, hepatoduodenal ligament, retroperitoneal tissues. Jejunal small bowel loops in the upper abdomen appear edematous. 5. Small volume free fluid around the liver and spleen with retroperitoneal edema/fluid noted bilaterally. 6. Hepatomegaly with fatty deposition. 7. Small bilateral effusions.     Electronically Signed   By: Kennith Center M.D.   On:  05:46    Assessment/Plan:  62 y.o. female critically ill, in respiratory failure on mechanical ventilation  I was contacted for evaluation of possible cholecystitis as one of the source of this patient current critical status.  The concern arise from imaging from the CT scan of the abdomen, abdominal ultrasound and MRCP which all showed evaluate thickening.  No sign of cholelithiasis.  Patient with labs concerning of acute liver failure with elevated INR, elevated AST/ALT.  Patient known history of alcohol abuse and Tylenol use.  I think that imaging finding of gallbladder thickening are secondary to the acute phase of the liver failure with aggressive IV hydration causing edema of the tissues.  As per those images there is also a lot of edema surrounding the pancreas, duodenum, liver.  Usually acalculus cholecystitis is seen and patient had been on the ICU septic shock that eventually get complicated with the acute acalculous cholecystitis from ischemic process.  If there is any further concern of cholecystitis, consider doing HIDA scan.  If HIDA scan shows no visualization of the gallbladder (understanding that this could be a false positive nonvisualization due to edema) then I would recommend to proceed with percutaneous cholecystostomy by IR.  No surgical intervention recommended for this patient at this moment due to critical status.  Gae Gallop, MD

## 2022-05-02 NOTE — Procedures (Signed)
Intubation Procedure Note  Ariel White  [id number]  1960-08-20  Date:  Time:8:36 AM   Provider Performing:Xaine Sansom    Procedure: Intubation (31500)  Indication(s) Respiratory Failure  Consent Risks of the procedure as well as the alternatives and risks of each were explained to the patient and/or caregiver.  Consent for the procedure was obtained and is signed in the bedside chart   Anesthesia Etomidate and Succinylcholine (50 of succ, 10 of etomidate)   Time Out Verified patient identification, verified procedure, site/side was marked, verified correct patient position, special equipment/implants available, medications/allergies/relevant history reviewed, required imaging and test results available.   Sterile Technique Usual hand hygeine, masks, and gloves were used   Procedure Description Patient positioned in bed supine.  Sedation given as noted above.  Patient was intubated with a 7.5 endotracheal tube using Glidescope.  View was Grade 1 full glottis .  Number of attempts was 1.  Colorimetric CO2 detector was consistent with tracheal placement.   Complications/Tolerance None; patient tolerated the procedure well. Chest X-ray is ordered to verify placement.   EBL Minimal   Specimen(s) None  Armando Reichert, MD [facility] 05/01/2022 8:37 AM

## 2022-05-02 NOTE — ED Notes (Signed)
Lab called to redraw labs. This RN attempted several times without success.

## 2022-05-02 NOTE — Anesthesia Preprocedure Evaluation (Addendum)
Anesthesia Evaluation  Patient identified by MRN, date of birth, ID band Patient unresponsive    Reviewed: Allergy & Precautions, NPO status , Patient's Chart, lab work & pertinent test results  History of Anesthesia Complications Negative for: history of anesthetic complications  Airway Mallampati: Intubated       Dental   Pulmonary Current Smoker and Patient abstained from smoking.   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  Reproductive/Obstetrics                             Anesthesia Physical Anesthesia Plan  ASA: 5 and emergent  Anesthesia Plan: General   Post-op Pain Management:    Induction: Intravenous  PONV Risk Score and Plan: 2 and Ondansetron, Dexamethasone and Treatment may vary due to age or medical condition  Airway Management Planned: Oral ETT  Additional Equipment:   Intra-op Plan:   Post-operative Plan: Post-operative intubation/ventilation  Informed Consent: I have reviewed the patients History and Physical, chart, labs and discussed the procedure including the risks, benefits and alternatives for the proposed anesthesia with the patient or authorized representative who has indicated his/her understanding and acceptance.     Consent reviewed  with POA  Plan Discussed with: CRNA  Anesthesia Plan Comments: (Update 8:00 AM 05/02/21: Discussed at length with Dr. Claudia Desanctis.  The patient is at very high risk for morbidity and mortality if anesthesia needed.  Dr. Claudia Desanctis is not convinced that the kidney stone is the main causal agent for the patient's acute illness.  Agreed to cancel case for now and will reassess as the day progresses.  Update 14:00 on 05/02/21: Discussed case with Dr. Claudia Desanctis and Dr. Genia Harold; they both feel that patient is not improving and relieving kidney obstruction may improve patient's outlook.  Overall prognosis poor; I told both Dr. Claudia Desanctis and Dr. Genia Harold that patient not likely to survive surgery.  I also spoke with patient's sister, Leane Para, in waiting room and conveyed the same message: the patient is very sick and may die during surgery.  She understands the risks and wishes to proceed.  She would like the patient to remain full code.)        Anesthesia Quick Evaluation

## 2022-05-02 NOTE — ED Notes (Addendum)
This RN called and spoke with Andee Poles from [employer] regarding patient's labs. Poison control recommendations as follows:  -Antizol 15mg /kg 1 time dose -NAC 150mg /kg loading dose over 1 hr, then 15mg /kg per hour for 24 hrs -cardiac monitoring -Repeat CMP, PT/INR/Acetaminophen level in 22hrs -Aggressive re-hydration Recheck Abg or VBG/EKG/CMP in 4 hrs to determine if acidosis is resoliving -Maintain K @ 4 & Mag @ 2  EDP made aware of poision control recommendations.

## 2022-05-02 NOTE — Consult Note (Signed)
I have been asked to see the patient by Dr. Artis Delay for evaluation and management of obstructing left ureteral calculus in setting of sepsis.  History of present illness: 62 year old woman with a history of seizures, bipolar disorder and GERD who was brought to the ED by EMS for black runny stool, shortness of breath and generalized weakness.  She also reported right upper quadrant abdominal pain and nausea.  Subsequent CT scan of the abdomen pelvis without contrast showed gallbladder wall thickening as well as a 4 mm proximal ureteral calculus with mild hydronephrosis.  Urology was subsequently called for management of the stone.  In the ED, patient was tachycardic and hypotensive.  She initially responded to fluid resuscitation however became hypotensive again requiring blood pressure support.  Further lab work showed elevated LFTs, elevated lactic acid level, elevated acetaminophen level.  WBC was 11.7 with a hemoglobin of 13.1.  Blood gas this morning returned with a pH of 6.94 and pCO2 of 26.  Patient was unable to provide history.  Review of systems: Per chart review, unable to obtain due to patient's clinical status  Patient Active Problem List   Diagnosis Date Noted   Acute GI bleeding 05/14/22   Falls frequently 10/30/2020   Alcohol abuse with withdrawal (HCC) 10/29/2020   Frequent falls 10/29/2020   Right supracondylar humerus fracture, closed, initial encounter 10/29/2020   Fibula fracture 03/02/2019   Closed right fibular fracture 03/02/2019   Bipolar 1 disorder (HCC)    GERD (gastroesophageal reflux disease)    Seizures (HCC)    Syncope 01/15/2019   Altered mental state 01/14/2019   Tobacco abuse 01/14/2019   Anxiety 01/14/2019   Rhabdomyolysis 01/14/2019   Macrocytosis 01/14/2019   Status post fall 01/14/2019   Fall    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   Current Outpatient Medications on File Prior to Encounter  Medication Sig Dispense Refill   atorvastatin (LIPITOR) 20 MG tablet Take 20 mg by mouth daily.     diphenhydramine-acetaminophen (TYLENOL PM) 25-500 MG TABS tablet Take 2 tablets by mouth at bedtime as needed (sleep).      folic acid (FOLVITE) 1 MG tablet Take 1 tablet (1 mg total) by mouth daily.     lamoTRIgine (LAMICTAL) 100 MG tablet Take 100 mg by mouth 2 (two) times daily.     Multiple Vitamin (MULTIVITAMIN WITH MINERALS) TABS tablet Take 1 tablet by mouth daily.     OLANZapine (ZYPREXA) 2.5 MG tablet Take 1 tablet (2.5 mg total) by mouth 2 (two) times daily. (Patient taking differently: Take 2.5 mg by mouth every evening.) 60 tablet 0   pantoprazole (PROTONIX) 20 MG tablet Take 20 mg by mouth.     propranolol (INDERAL) 40 MG tablet Take 40 mg by mouth 2 (two) times daily.     senna-docusate (SENOKOT-S) 8.6-50 MG tablet Take 1 tablet by mouth at bedtime as needed for mild constipation.     thiamine 100 MG tablet Take 1 tablet (100 mg total) by mouth daily.     traZODone (DESYREL) 150 MG tablet Take 150 mg by mouth  at bedtime.      Past Medical History:  Diagnosis Date   Anxiety    Arthritis    Bipolar 1 disorder (HCC)    Chronic pain    Depression    Eczema    Fibromyalgia    GERD (gastroesophageal reflux disease)    Headache    migraines   History of hiatal hernia    Neuromuscular disorder (HCC)    Seizures ([place])     Past Surgical History:  Procedure Laterality Date   ABDOMINAL HYSTERECTOMY     APPENDECTOMY     BRAIN TUMOR EXCISION  1985   SHOULDER HEMI-ARTHROPLASTY  Right 11/14/2018   Procedure: SHOULDER HEMI-ARTHROPLASTY;  Surgeon: Corky Mull, MD;  Location: [facility] ORS;  Service: Orthopedics;  Laterality: Right;   TUBAL LIGATION      Social History   Tobacco Use   Smoking status: Every Day    Packs/day: 0.25    Types: Cigarettes   Smokeless tobacco: Never  Vaping Use   Vaping Use: Never used  Substance Use Topics   Alcohol use: Yes    Alcohol/week: 2.0 standard drinks of alcohol    Types: 2 Cans of beer per week    Comment: once per month   Drug use: Never    Comment: 3 TO 5 YEARS    Family History  Problem Relation Age of Onset   Breast cancer Mother        36's    PE: Vitals:   05/26/2022 0330 05/20/2022 0345 05/22/2022 0515 04/28/2022 0530  BP: (!) 86/71 (!) 88/54 (!) 68/52 (!) 70/59  Pulse:      Resp: (!) 21 (!) 23 17 (!) 24  Temp:      TempSrc:      SpO2:    97%  Weight:      Height:       Patient obtunded, will respond to verbal stimuli, pt moaning Atraumatic normocephalic head No cervical or supraclavicular lymphadenopathy appreciated Increased work of breathing on nasal cannula oxygen Abdomen is soft, diffuse tenderness to palpation Lower extremities are symmetric without appreciable edema Ecchymosis noted on left eye, right forearm  Recent Labs    05/08/2022 2232  WBC 11.7*  HGB 13.1  HCT 40.2   Recent Labs    05/08/2022 2232  NA 128*  K 3.1*  CL 92*  CO2 13*  GLUCOSE 85  BUN 32*  CREATININE 1.85*  CALCIUM 8.2*   Recent Labs    05/24/2022 0005  INR 1.9*   No results for input(s): "LABURIN" in the last 72 hours. Results for orders placed or performed during the hospital encounter of   Resp panel by RT-PCR (RSV, Flu A&B, Covid) Anterior Nasal Swab     Status: None   Collection Time: 05/20/2022 10:27 PM   Specimen: Anterior Nasal Swab  Result Value Ref Range Status   SARS Coronavirus 2 by RT PCR NEGATIVE NEGATIVE Final    Comment: (NOTE) SARS-CoV-2 target nucleic acids are NOT DETECTED.  The  SARS-CoV-2 RNA is generally detectable in upper respiratory specimens during the acute phase of infection. The lowest concentration of SARS-CoV-2 viral copies this assay can detect is 138 copies/mL. A negative result does not preclude SARS-Cov-2 infection and should not be used as the sole basis for treatment or other patient management decisions. A negative result may occur with  improper specimen collection/handling, submission of specimen other than nasopharyngeal swab, presence of viral mutation(s) within the areas targeted by this assay, and  inadequate number of viral copies(<138 copies/mL). A negative result must be combined with clinical observations, patient history, and epidemiological information. The expected result is Negative.  Fact Sheet for Patients:  EntrepreneurPulse.com.au  Fact Sheet for Healthcare Providers:  IncredibleEmployment.be  This test is no t yet approved or cleared by the [place] FDA and  has been authorized for detection and/or diagnosis of SARS-CoV-2 by FDA under an Emergency Use Authorization (EUA). This EUA will remain  in effect (meaning this test can be used) for the duration of the COVID-19 declaration under Section 564(b)(1) of the Act, 21 U.S.C.section 360bbb-3(b)(1), unless the authorization is terminated  or revoked sooner.       Influenza A by PCR NEGATIVE NEGATIVE Final   Influenza B by PCR NEGATIVE NEGATIVE Final    Comment: (NOTE) The Xpert Xpress SARS-CoV-2/FLU/RSV plus assay is intended as an aid in the diagnosis of influenza from Nasopharyngeal swab specimens and should not be used as a sole basis for treatment. Nasal washings and aspirates are unacceptable for Xpert Xpress SARS-CoV-2/FLU/RSV testing.  Fact Sheet for Patients: EntrepreneurPulse.com.au  Fact Sheet for Healthcare Providers: IncredibleEmployment.be  This test is not yet approved or  cleared by the [place] FDA and has been authorized for detection and/or diagnosis of SARS-CoV-2 by FDA under an Emergency Use Authorization (EUA). This EUA will remain in effect (meaning this test can be used) for the duration of the COVID-19 declaration under Section 564(b)(1) of the Act, 21 U.S.C. section 360bbb-3(b)(1), unless the authorization is terminated or revoked.     Resp Syncytial Virus by PCR NEGATIVE NEGATIVE Final    Comment: (NOTE) Fact Sheet for Patients: EntrepreneurPulse.com.au  Fact Sheet for Healthcare Providers: IncredibleEmployment.be  This test is not yet approved or cleared by the [place] FDA and has been authorized for detection and/or diagnosis of SARS-CoV-2 by FDA under an Emergency Use Authorization (EUA). This EUA will remain in effect (meaning this test can be used) for the duration of the COVID-19 declaration under Section 564(b)(1) of the Act, 21 U.S.C. section 360bbb-3(b)(1), unless the authorization is terminated or revoked.  Performed at [facility], [address] [place], [place] [address]   Blood Culture (routine x 2)     Status: None (Preliminary result)   Collection Time: 05/15/2022 10:27 PM   Specimen: BLOOD  Result Value Ref Range Status   Specimen Description BLOOD BLOOD LEFT FOREARM  Final   Special Requests   Final    BOTTLES DRAWN AEROBIC AND ANAEROBIC Blood Culture results may not be optimal due to an inadequate volume of blood received in culture bottles   Culture   Final    NO GROWTH < 12 HOURS Performed at [facility], [address] [place], [place] [address]    Report Status PENDING  Incomplete  Blood Culture (routine x 2)     Status: None (Preliminary result)   Collection Time: 04/27/2022 10:27 PM   Specimen: BLOOD  Result Value Ref Range Status   Specimen Description BLOOD BLOOD RIGHT FOREARM  Final   Special Requests   Final    BOTTLES DRAWN AEROBIC  AND ANAEROBIC Blood Culture adequate volume   Culture   Final    NO GROWTH < 12 HOURS Performed at [facility], [address], [place], [place] [address]    Report Status PENDING  Incomplete    Imaging: CT of abdomen pelvis without contrast IMPRESSION: 1. Mild left hydronephrosis and perinephric stranding probable obstructing 4 mm calculus  within the proximal left ureter. Mild surrounding inflammatory stranding suggests periureteric stranding related to urothelial inflammation. Correlation with urinalysis and urine culture may be helpful. Superimposed mild bilateral nonobstructing nephrolithiasis. 2. Mild hepatic steatosis. 3. Gallbladder wall thickening and soft tissue infiltration within the porta hepatis. This may reflect changes of a inflammatory process involving the biliary tree, head of the pancreas, or second portion of the duodenum. Correlation with liver enzymes and serum amylase and lipase levels may be helpful for further evaluation. 4. Aortic atherosclerosis.   Aortic Atherosclerosis (ICD10-I70.0).     Electronically Signed   By: Fidela Salisbury M.D.   On: 05/08/2022 00:52  Imp/Recommendations: Ureteral calculus: -Patient does have evidence of a 4 mm obstructing left ureteral calculus with mild hydronephrosis however urinalysis and white count did not support infectious cause of septic shock -I did discuss with ED and ICU the possibility of placing left ureteral stent to eliminate that as source of worsening clinical status -Over course of discussion, patient's blood gas returned with a pH of 6.94.  Discussion with the anesthesiologist also agreed that the risk of anesthesia in this case was very high. -Given patient's overall clinical state I have low suspicion to think that the stone is the source of decompensation. -Patient is currently being intubated and sent to the ICU for management.  If patient stabilizes over the course of the next several  hours/days we can repeat imaging with a renal ultrasound to see if she still has evidence of mild obstruction.  Given the size of stone it may pass on its own and obstruction may resolve itself. -I discussed the plan with patient's sister as well as the ICU attending  Thank you for involving me in this patient's care.  Please page with any further questions or concerns. Lean Jaeger D Abdullah Rizzi

## 2022-05-02 NOTE — Progress Notes (Signed)
[facility] for Electrolyte Monitoring and Replacement   Recent Labs: Potassium (mmol/L)  Date Value  05/12/2022 4.1  01/01/2014 3.6   Magnesium (mg/dL)  Date Value  05/05/2022 1.5 (L)   Calcium (mg/dL)  Date Value  05/12/2022 6.6 (L)   Calcium, Total (mg/dL)  Date Value  01/01/2014 8.3 (L)   Albumin (g/dL)  Date Value  05/26/2022 2.2 (L)  01/01/2014 3.4   Phosphorus (mg/dL)  Date Value  10/30/2020 2.7   Sodium (mmol/L)  Date Value  05/01/2022 134 (L)  01/01/2014 138   Corrected Ca: 8 mg/dL  Assessment: 62 y.o. female w/ PMH of seizures, GERD, BPD admitted on 05/08/2022 with shortness of breath, abdominal pain, generalized weakness, and dark tarry stools. Pharmacy is asked to follow and replace electrolytes while in CCU  Goal of Therapy:  Electrolytes WNL  Plan:  ---2 grams IV magnesium sulfate x 1 ---recheck electrolytes in am  [name] ,PharmD Clinical Pharmacist 05/11/2022 8:05 AM

## 2022-05-02 NOTE — H&P (View-Only) (Signed)
I have been asked to see the patient by Dr. Artis Delay for evaluation and management of obstructing left ureteral calculus in setting of sepsis.  History of present illness: 62 year old woman with a history of seizures, bipolar disorder and GERD who was brought to the ED by EMS for black runny stool, shortness of breath and generalized weakness.  She also reported right upper quadrant abdominal pain and nausea.  Subsequent CT scan of the abdomen pelvis without contrast showed gallbladder wall thickening as well as a 4 mm proximal ureteral calculus with mild hydronephrosis.  Urology was subsequently called for management of the stone.  In the ED, patient was tachycardic and hypotensive.  She initially responded to fluid resuscitation however became hypotensive again requiring blood pressure support.  Further lab work showed elevated LFTs, elevated lactic acid level, elevated acetaminophen level.  WBC was 11.7 with a hemoglobin of 13.1.  Blood gas this morning returned with a pH of 6.94 and pCO2 of 26.  Patient was unable to provide history.  Review of systems: Per chart review, unable to obtain due to patient's clinical status  Patient Active Problem List   Diagnosis Date Noted   Acute GI bleeding 05/14/22   Falls frequently 10/30/2020   Alcohol abuse with withdrawal (HCC) 10/29/2020   Frequent falls 10/29/2020   Right supracondylar humerus fracture, closed, initial encounter 10/29/2020   Fibula fracture 03/02/2019   Closed right fibular fracture 03/02/2019   Bipolar 1 disorder (HCC)    GERD (gastroesophageal reflux disease)    Seizures (HCC)    Syncope 01/15/2019   Altered mental state 01/14/2019   Tobacco abuse 01/14/2019   Anxiety 01/14/2019   Rhabdomyolysis 01/14/2019   Macrocytosis 01/14/2019   Status post fall 01/14/2019   Fall    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   Current Outpatient Medications on File Prior to Encounter  Medication Sig Dispense Refill   atorvastatin (LIPITOR) 20 MG tablet Take 20 mg by mouth daily.     diphenhydramine-acetaminophen (TYLENOL PM) 25-500 MG TABS tablet Take 2 tablets by mouth at bedtime as needed (sleep).      folic acid (FOLVITE) 1 MG tablet Take 1 tablet (1 mg total) by mouth daily.     lamoTRIgine (LAMICTAL) 100 MG tablet Take 100 mg by mouth 2 (two) times daily.     Multiple Vitamin (MULTIVITAMIN WITH MINERALS) TABS tablet Take 1 tablet by mouth daily.     OLANZapine (ZYPREXA) 2.5 MG tablet Take 1 tablet (2.5 mg total) by mouth 2 (two) times daily. (Patient taking differently: Take 2.5 mg by mouth every evening.) 60 tablet 0   pantoprazole (PROTONIX) 20 MG tablet Take 20 mg by mouth.     propranolol (INDERAL) 40 MG tablet Take 40 mg by mouth 2 (two) times daily.     senna-docusate (SENOKOT-S) 8.6-50 MG tablet Take 1 tablet by mouth at bedtime as needed for mild constipation.     thiamine 100 MG tablet Take 1 tablet (100 mg total) by mouth daily.     traZODone (DESYREL) 150 MG tablet Take 150 mg by mouth  at bedtime.      Past Medical History:  Diagnosis Date   Anxiety    Arthritis    Bipolar 1 disorder (HCC)    Chronic pain    Depression    Eczema    Fibromyalgia    GERD (gastroesophageal reflux disease)    Headache    migraines   History of hiatal hernia    Neuromuscular disorder (HCC)    Seizures (HCC)     Past Surgical History:  Procedure Laterality Date   ABDOMINAL HYSTERECTOMY     APPENDECTOMY     BRAIN TUMOR EXCISION  1985   SHOULDER HEMI-ARTHROPLASTY  Right 11/14/2018   Procedure: SHOULDER HEMI-ARTHROPLASTY;  Surgeon: Poggi, John J, MD;  Location: [facility] ORS;  Service: Orthopedics;  Laterality: Right;   TUBAL LIGATION      Social History   Tobacco Use   Smoking status: Every Day    Packs/day: 0.25    Types: Cigarettes   Smokeless tobacco: Never  Vaping Use   Vaping Use: Never used  Substance Use Topics   Alcohol use: Yes    Alcohol/week: 2.0 standard drinks of alcohol    Types: 2 Cans of beer per week    Comment: once per month   Drug use: Never    Comment: 3 TO 5 YEARS    Family History  Problem Relation Age of Onset   Breast cancer Mother        70's    PE: Vitals:   05/19/2022 0330 05/14/2022 0345 04/30/2022 0515 05/24/2022 0530  BP: (!) 86/71 (!) 88/54 (!) 68/52 (!) 70/59  Pulse:      Resp: (!) 21 (!) 23 17 (!) 24  Temp:      TempSrc:      SpO2:    97%  Weight:      Height:       Patient obtunded, will respond to verbal stimuli, pt moaning Atraumatic normocephalic head No cervical or supraclavicular lymphadenopathy appreciated Increased work of breathing on nasal cannula oxygen Abdomen is soft, diffuse tenderness to palpation Lower extremities are symmetric without appreciable edema Ecchymosis noted on left eye, right forearm  Recent Labs    05/20/2022 2232  WBC 11.7*  HGB 13.1  HCT 40.2   Recent Labs    04/30/2022 2232  NA 128*  K 3.1*  CL 92*  CO2 13*  GLUCOSE 85  BUN 32*  CREATININE 1.85*  CALCIUM 8.2*   Recent Labs    05/01/2022 0005  INR 1.9*   No results for input(s): "LABURIN" in the last 72 hours. Results for orders placed or performed during the hospital encounter of 05/25/2022  Resp panel by RT-PCR (RSV, Flu A&B, Covid) Anterior Nasal Swab     Status: None   Collection Time: 04/29/2022 10:27 PM   Specimen: Anterior Nasal Swab  Result Value Ref Range Status   SARS Coronavirus 2 by RT PCR NEGATIVE NEGATIVE Final    Comment: (NOTE) SARS-CoV-2 target nucleic acids are NOT DETECTED.  The  SARS-CoV-2 RNA is generally detectable in upper respiratory specimens during the acute phase of infection. The lowest concentration of SARS-CoV-2 viral copies this assay can detect is 138 copies/mL. A negative result does not preclude SARS-Cov-2 infection and should not be used as the sole basis for treatment or other patient management decisions. A negative result may occur with  improper specimen collection/handling, submission of specimen other than nasopharyngeal swab, presence of viral mutation(s) within the areas targeted by this assay, and   inadequate number of viral copies(<138 copies/mL). A negative result must be combined with clinical observations, patient history, and epidemiological information. The expected result is Negative.  Fact Sheet for Patients:  https://www.fda.gov/media/152166/download  Fact Sheet for Healthcare Providers:  https://www.fda.gov/media/152162/download  This test is no t yet approved or cleared by the [place] FDA and  has been authorized for detection and/or diagnosis of SARS-CoV-2 by FDA under an Emergency Use Authorization (EUA). This EUA will remain  in effect (meaning this test can be used) for the duration of the COVID-19 declaration under Section 564(b)(1) of the Act, 21 U.S.C.section 360bbb-3(b)(1), unless the authorization is terminated  or revoked sooner.       Influenza A by PCR NEGATIVE NEGATIVE Final   Influenza B by PCR NEGATIVE NEGATIVE Final    Comment: (NOTE) The Xpert Xpress SARS-CoV-2/FLU/RSV plus assay is intended as an aid in the diagnosis of influenza from Nasopharyngeal swab specimens and should not be used as a sole basis for treatment. Nasal washings and aspirates are unacceptable for Xpert Xpress SARS-CoV-2/FLU/RSV testing.  Fact Sheet for Patients: https://www.fda.gov/media/152166/download  Fact Sheet for Healthcare Providers: https://www.fda.gov/media/152162/download  This test is not yet approved or  cleared by the [place] FDA and has been authorized for detection and/or diagnosis of SARS-CoV-2 by FDA under an Emergency Use Authorization (EUA). This EUA will remain in effect (meaning this test can be used) for the duration of the COVID-19 declaration under Section 564(b)(1) of the Act, 21 U.S.C. section 360bbb-3(b)(1), unless the authorization is terminated or revoked.     Resp Syncytial Virus by PCR NEGATIVE NEGATIVE Final    Comment: (NOTE) Fact Sheet for Patients: https://www.fda.gov/media/152166/download  Fact Sheet for Healthcare Providers: https://www.fda.gov/media/152162/download  This test is not yet approved or cleared by the [place] FDA and has been authorized for detection and/or diagnosis of SARS-CoV-2 by FDA under an Emergency Use Authorization (EUA). This EUA will remain in effect (meaning this test can be used) for the duration of the COVID-19 declaration under Section 564(b)(1) of the Act, 21 U.S.C. section 360bbb-3(b)(1), unless the authorization is terminated or revoked.  Performed at [facility], [address] [place], [place] [address]   Blood Culture (routine x 2)     Status: None (Preliminary result)   Collection Time: 05/05/2022 10:27 PM   Specimen: BLOOD  Result Value Ref Range Status   Specimen Description BLOOD BLOOD LEFT FOREARM  Final   Special Requests   Final    BOTTLES DRAWN AEROBIC AND ANAEROBIC Blood Culture results may not be optimal due to an inadequate volume of blood received in culture bottles   Culture   Final    NO GROWTH < 12 HOURS Performed at [facility], [address] [place], [place] [address]    Report Status PENDING  Incomplete  Blood Culture (routine x 2)     Status: None (Preliminary result)   Collection Time: 05/16/2022 10:27 PM   Specimen: BLOOD  Result Value Ref Range Status   Specimen Description BLOOD BLOOD RIGHT FOREARM  Final   Special Requests   Final    BOTTLES DRAWN AEROBIC  AND ANAEROBIC Blood Culture adequate volume   Culture   Final    NO GROWTH < 12 HOURS Performed at [facility], [address] [place], [place] [address]    Report Status PENDING  Incomplete    Imaging: CT of abdomen pelvis without contrast IMPRESSION: 1. Mild left hydronephrosis and perinephric stranding probable obstructing 4 mm calculus   within the proximal left ureter. Mild surrounding inflammatory stranding suggests periureteric stranding related to urothelial inflammation. Correlation with urinalysis and urine culture may be helpful. Superimposed mild bilateral nonobstructing nephrolithiasis. 2. Mild hepatic steatosis. 3. Gallbladder wall thickening and soft tissue infiltration within the porta hepatis. This may reflect changes of a inflammatory process involving the biliary tree, head of the pancreas, or second portion of the duodenum. Correlation with liver enzymes and serum amylase and lipase levels may be helpful for further evaluation. 4. Aortic atherosclerosis.   Aortic Atherosclerosis (ICD10-I70.0).     Electronically Signed   By: Fidela Salisbury M.D.   On: 05/08/2022 00:52  Imp/Recommendations: Ureteral calculus: -Patient does have evidence of a 4 mm obstructing left ureteral calculus with mild hydronephrosis however urinalysis and white count did not support infectious cause of septic shock -I did discuss with ED and ICU the possibility of placing left ureteral stent to eliminate that as source of worsening clinical status -Over course of discussion, patient's blood gas returned with a pH of 6.94.  Discussion with the anesthesiologist also agreed that the risk of anesthesia in this case was very high. -Given patient's overall clinical state I have low suspicion to think that the stone is the source of decompensation. -Patient is currently being intubated and sent to the ICU for management.  If patient stabilizes over the course of the next several  hours/days we can repeat imaging with a renal ultrasound to see if she still has evidence of mild obstruction.  Given the size of stone it may pass on its own and obstruction may resolve itself. -I discussed the plan with patient's sister as well as the ICU attending  Thank you for involving me in this patient's care.  Please page with any further questions or concerns. Malaina Mortellaro D Windy Dudek

## 2022-05-02 NOTE — Progress Notes (Signed)
MEDICATION RELATED CONSULT NOTE - INITIAL   Pharmacy Consult for N-AC  Indication: APAP overdose (Chronic)   Allergies  Allergen Reactions   Compazine [Prochlorperazine Edisylate] Other (See Comments)    euphoria   Ivp Dye [Iodinated Contrast Media] Anaphylaxis    "stopped her heart"   Baclofen Other (See Comments)    dizziness   Imitrex [Sumatriptan] Swelling    "throat swelling"   Voltaren [Diclofenac] Other (See Comments)    Cannot hold onto objects (drops things)    Patient Measurements: Height: 5\' 4"  (162.6 cm) Weight: 54.4 kg (120 lb) IBW/kg (Calculated) : 54.7 Adjusted Body Weight:   Vital Signs: Temp: 97.6 F (36.4 C) May 15, 2022 2215) Temp Source: Rectal 05/15/2022 2215) BP: 112/43 (01/07 0030) Pulse Rate: 122 (01/07 0045) Intake/Output from previous day: No intake/output data recorded. Intake/Output from this shift: No intake/output data recorded.  Labs: Recent Labs    05/15/22 2232 05/26/2022 0005  WBC 11.7*  --   HGB 13.1  --   HCT 40.2  --   PLT 102*  --   APTT  --  43*  CREATININE 1.85*  --   ALBUMIN 3.2*  --   PROT 6.4*  --   AST 1,205*  --   ALT 252*  --   ALKPHOS 114  --   BILITOT 1.6*  --    Estimated Creatinine Clearance: 27.8 mL/min (A) (by C-G formula based on SCr of 1.85 mg/dL (H)).   Microbiology: Recent Results (from the past 720 hour(s))  Resp panel by RT-PCR (RSV, Flu A&B, Covid) Anterior Nasal Swab     Status: None   Collection Time: 05-15-2022 10:27 PM   Specimen: Anterior Nasal Swab  Result Value Ref Range Status   SARS Coronavirus 2 by RT PCR NEGATIVE NEGATIVE Final    Comment: (NOTE) SARS-CoV-2 target nucleic acids are NOT DETECTED.  The SARS-CoV-2 RNA is generally detectable in upper respiratory specimens during the acute phase of infection. The lowest concentration of SARS-CoV-2 viral copies this assay can detect is 138 copies/mL. A negative result does not preclude SARS-Cov-2 infection and should not be used as the sole basis  for treatment or other patient management decisions. A negative result may occur with  improper specimen collection/handling, submission of specimen other than nasopharyngeal swab, presence of viral mutation(s) within the areas targeted by this assay, and inadequate number of viral copies(<138 copies/mL). A negative result must be combined with clinical observations, patient history, and epidemiological information. The expected result is Negative.  Fact Sheet for Patients:  06/30/22  Fact Sheet for Healthcare Providers:  BloggerCourse.com  This test is no t yet approved or cleared by the [employer] FDA and  has been authorized for detection and/or diagnosis of SARS-CoV-2 by FDA under an Emergency Use Authorization (EUA). This EUA will remain  in effect (meaning this test can be used) for the duration of the COVID-19 declaration under Section 564(b)(1) of the Act, 21 U.S.C.section 360bbb-3(b)(1), unless the authorization is terminated  or revoked sooner.       Influenza A by PCR NEGATIVE NEGATIVE Final   Influenza B by PCR NEGATIVE NEGATIVE Final    Comment: (NOTE) The Xpert Xpress SARS-CoV-2/FLU/RSV plus assay is intended as an aid in the diagnosis of influenza from Nasopharyngeal swab specimens and should not be used as a sole basis for treatment. Nasal washings and aspirates are unacceptable for Xpert Xpress SARS-CoV-2/FLU/RSV testing.  Fact Sheet for Patients: [place]  Fact Sheet for Healthcare Providers: BloggerCourse.com  This  test is not yet approved or cleared by the [place] and has been authorized for detection and/or diagnosis of SARS-CoV-2 by FDA under an Emergency Use Authorization (EUA). This EUA will remain in effect (meaning this test can be used) for the duration of the COVID-19 declaration under Section 564(b)(1) of the Act, 21  U.S.C. section 360bbb-3(b)(1), unless the authorization is terminated or revoked.     Resp Syncytial Virus by PCR NEGATIVE NEGATIVE Final    Comment: (NOTE) Fact Sheet for Patients: EntrepreneurPulse.com.au  Fact Sheet for Healthcare Providers: IncredibleEmployment.be  This test is not yet approved or cleared by the [place] FDA and has been authorized for detection and/or diagnosis of SARS-CoV-2 by FDA under an Emergency Use Authorization (EUA). This EUA will remain in effect (meaning this test can be used) for the duration of the COVID-19 declaration under Section 564(b)(1) of the Act, 21 U.S.C. section 360bbb-3(b)(1), unless the authorization is terminated or revoked.  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  Pt reportedly has been taking 3 - 4 650 mg APAP TID along with tylenol PM and trazodone QHS for unspecified period of time.     1/6 @ 2232 = AST = 1205                        ALT = 252                       SrCr = 1.85                       K = 3.1   1/7 @ 0005 =  APAP = 93                        Salicylate = < 7.0                         INR = 1.9   Goal of Therapy:  APAP :  10 - 30  LFT's : WNL  INR : 2 - 3   Plan:   Poison Control contacted who recommended begin  N - AC.   N-AC 8160 mg (150 mg/kg) IV X 1 followed N-AC (15 mg/kg) IV continuous infusion over 23 hrs.    Fomepizole 15 mg/kg (820 mg) IV X 1 ordered.   Will recheck INR, LFT's and APAP level 22 hrs after start of N-AC.   Yosgart Pavey D 05/23/2022,1:52 AM

## 2022-05-02 NOTE — ED Notes (Signed)
21 at lip 0814 Succs 50mg  0812 Etom 10mg  0174

## 2022-05-02 NOTE — Transfer of Care (Signed)
Immediate Anesthesia Transfer of Care Note  Patient: Ariel White  Procedure(s) Performed: CYSTOSCOPY WITH RETROGRADE PYELOGRAM/URETERAL STENT PLACEMENT (Left)  Patient Location: ICU  Anesthesia Type:General  Level of Consciousness: sedated  Airway & Oxygen Therapy: Patient placed on Ventilator (see vital sign flow sheet for setting) and vent settings same as preop  Post-op Assessment: Report given to RN and Post -op Vital signs reviewed and stable  Post vital signs: Reviewed and stable  Last Vitals:  Vitals Value Taken Time  BP 140/55 05/15/2022 1525  Temp    Pulse 141 05/11/2022 1525  Resp 28 05/25/2022 1525  SpO2 100 % 05/20/2022 1525    Last Pain:  Vitals:   04/27/2022 0900  TempSrc: Bladder         Complications: No notable events documented.

## 2022-05-02 NOTE — ED Provider Notes (Signed)
.  Critical Care  Performed by: Vanessa [place], MD Authorized by: Vanessa [place], MD   Critical care provider statement:    Critical care time (minutes):  30   Critical care was necessary to treat or prevent imminent or life-threatening deterioration of the following conditions:  Sepsis   Critical care was time spent personally by me on the following activities:  Development of treatment plan with patient or surrogate, discussions with consultants, evaluation of patient's response to treatment, examination of patient, ordering and review of laboratory studies, ordering and review of radiographic studies, ordering and performing treatments and interventions, pulse oximetry, re-evaluation of patient's condition and review of old charts  When I evaluated patient she does have a little bit of bruising on her left side of her face but she reports a fall from 2 to 3 weeks ago.  Denies any headaches or confusion from the fall.  Her pain seems to be more in her right upper quadrant.  She does not have any significant left flank pain.  Her CT scan was concerning for left kidney stone as well as right liver issues.  1:53 AM.  Discussed with urology Dr pace-recommend to call her if patient decompensates overnight with fever, hypotension again but at this time we will hold off on stenting until the morning to help stabilize patient given more likely source is the liver.  2:59 AM given the concern for some inflammation of the liver and the gallbladder I did order ultrasound and MRCP just to make sure no signs of choledocholithiasis that would require transfer.  We have discussed the case with poison control who did recommend treatment for Tylenol overdose.  Recommendations were followed.  They did recommend Antizol as well but patient denies any benzodiazepine use at home.  Patient's blood pressures have gotten better.  Repeat EKG is sinus tachycardia rate of 117 without any ST elevation, T wave version in V5 and  V6, normal intervals  Patient now having hypotension.  Patient started on a Levophed infusion.  Will hold off on further fluid boluses.  Patient remains on 6 L but chest x-ray was negative.  I have consulted the surgical team and have also updated urology team as well.  I discussed the case with ICU for admission.   Vanessa , MD June 01, 2022 [phone]

## 2022-05-02 NOTE — H&P (Signed)
NAME:  Ariel White, MRN:  [id number], DOB:  1960/07/31, LOS: 0 ADMISSION DATE:  05/16/2022, CONSULTATION DATE: 05/13/2022 REFERRING MD: Dr. Fuller Plan, CHIEF COMPLAINT: Shortness of Breath   History of Present Illness:  This is a 62 yo female who presented to [facility] ER on 01/7 via EMS with c/o shortness of breath, abdominal pain, generalized weakness, and dark tarry stools onset 3 days prior to presentation.  She denies taking blood thinners, however she does endorse taking frequent doses of tylenol. Pts sister reported the pt has also had alcohol use as well.  The pt reported to EMS she had not eaten in several days.  En route to the ER pt became hypotensive bp 55/36/heart rate 114/RA 98%, code sepsis activated.    ED Course  Upon arrival to the ER pt remained profoundly hypotensive, hypothermic, and tachycardiac.  Pt placed on 6L O2 via nasal canula.  She received 2L of NS and 2 units of emergent pRBC's.  She also received zofran/protonix/metronidazole/vancomycin. Significant labs were Na+ 128/K+ 3.1/chloride 92/CO2 13/BUN 32/creatinine 1.85/anion gap 23/magnesium 1.5/AST 1,205/ALT 252/lactic acid >9.0/wbc 11.7/platelets 102/tylenol level 93/salicylate <7.0. Due to concern of unintentional tylenol overdose poison control contacted and recommended acetylcysteine load followed by continuous gtt and 820 mg iv antizol x1 dose.  CT Abd/Pelvis concerning for mild left hydronephrosis/perinephric stranding probable obstructing 4 mm calculus within the proximal left ureter/mild hepatic steatosis/gallbladder wall thickening and soft tissue infiltration within the porta hepatis.  Urology consulted by ER provider and at that time held off on stent placement with recommendation to stabilize the pt with plans for stent placement in the am.  However, pt later decompensated and stent placement put on hole.  Due to concern of possible choledocholithiasis MRCP and Abdominal US ordered.  Results were negative for choledocholithiasis  (see detailed report below).  General Surgery consulted with recommendations to treat tylenol overdose with low suspicion for cholecystitis.  While in the ER pt became hypotensive requiring levophed gtt.  PCCM team contacted for ICU admission.     CXR: No active disease.  CT Head/Cervical Spine:  No acute intracranial abnormalities. Chronic atrophy and small vessel ischemic changes. Postoperative changes in the skull base. Alignment of the cervical spine is unchanged since prior study consistent with degenerative change. No acute displaced fractures identified.  CT Elbow Right: Acute supracondylar fracture involving the distal humerus is identified. The fracture fragments are in near anatomic alignment. Remote distal diaphyseal fracture of the distal humerus.  CT Abd/Pelvis:  Mild left hydronephrosis and perinephric stranding probable obstructing 4 mm calculus within the proximal left ureter. Mild surrounding inflammatory stranding suggests periureteric stranding related to urothelial inflammation. Correlation with urinalysis and urine culture may be helpful. Superimposed mild bilateral nonobstructing nephrolithiasis. Mild hepatic steatosis. Gallbladder wall thickening and soft tissue infiltration within the porta hepatis. This may reflect changes of a inflammatory process involving the biliary tree, head of the pancreas, or second portion of the duodenum. Correlation with liver enzymes and serum amylase and lipase levels may be helpful for further evaluation. Aortic atherosclerosis. (ICD10-I70.0).  MRCP Abd:  Marked edema and wall thickening of the gallbladder wall with a nondistended lumen. No evidence for gallstones an no discrete enhancing mass lesion is discernible within the gallbladder although the mucosa is diffusely hyperenhancing after contrast administration. Gallbladder wall thickening/edema is considered nonspecific in this study given no discernible gallstones and can be seen in the  setting of infection/inflammation, volume overload, liver failure, and heart failure. No substantial intrahepatic or extrahepatic  biliary duct dilatation. No evidence of choledocholithiasis. Pancreas appears to have some edema in the parenchyma with associated edema in the retroperitoneal tissues. Imaging of the pancreas is nondiagnostic on pre and postcontrast T1 imaging due to motion artifact. Fairly extensive edema in the gallbladder fossa, hepatoduodenal ligament, retroperitoneal tissues. Jejunal small bowel loops in the upper abdomen appear edematous. Small volume free fluid around the liver and spleen with retroperitoneal edema/fluid noted bilaterally. Hepatomegaly with fatty deposition. Small bilateral effusions.  Pertinent  Medical History  Anxiety  Arthritis  Bipolar 1 Disorder  Chronic Pain  Depression  Eczema  Fibromyalgia  GERD  Migraines  Hiatal Hernia  Neuromuscular Disorder  Seizures   Significant Hospital Events: Including procedures, antibiotic start and stop dates in addition to other pertinent events   01/7: Pt admitted to ICU with anion gap metabolic acidosis, acute respiratory failure, and septic shock along with 4 mm obstructing ureteral calculus; liver failure secondary to unintentional tylenol overdose along with alcohol use and possible acute cholecystitis requiring mechanical intubation and vasopressor support  01/7: Attempted to transfer pt to [facility], however [facility] declined transfer   Interim History / Subjective:  Pt lethargic and confused prior to intubation complaining of severe RUQ abdominal pain.  Prior to intubation pt tachypneic and severely hypotensive requiring levophed gtt   Objective   Blood pressure (!) 70/59, pulse (!) 117, temperature 97.6 F (36.4 C), temperature source Rectal, resp. rate (!) 24, height 5\' 4"  (1.626 m), weight 54.4 kg, SpO2 97 %.       No intake or output data in the 24 hours ending 05/10/2022 0711 Filed Weights    05-17-22 2221  Weight: 54.4 kg    Examination: General: Acute on chronically-ill appearing female, in respiratory distress on 6L O2 via nasal canula  HENT: Supple, no JVD  Lungs: Clear throughout, even, non labored  Cardiovascular: Sinus tachycardia, rrr, no r/g, 2+ radial/1+ distal pulses, no edema  Abdomen: +BS x4, soft, non distended, diffuse RUQ tenderness  Extremities: Normal bulk and tone, moves all extremities  Neuro: Lethargic/confused, followed commands, PERRL GU: Indwelling foley catheter draining clear yellow urine   Resolved Hospital Problem list     Assessment & Plan:  Acute respiratory failure in the setting of anion gap metabolic acidosis  Mechanical Intubation  - Full vent support for now: vent settings reviewed and established - Continue lung protective strategies  - SBT once all parameters met  - Intermittent CXR and ABG's  - VAP bundle implemented  - Maintain RASS goal 0 to -1 - PAD protocol to maintain RASS goal: Fentanyl gtt and prn versed to maintain map goal   Septic and hypovolemic shock  - Continuous telemetry monitoring  - Aggressive iv fluid resuscitation and prn levophed/vasopressin gtts to maintain map >65 - Troponin pending   Acute kidney injury secondary to ATN with mild hydronephrosis  Severe anion gap metabolic acidosis and lactic acidosis Mild hyponatremia secondary to volume depletion  Hypomagnesia  - Trend BMP and lactic acid  - Replace electrolytes as indicated  - Monitor UOP  - Avoid nephrotoxic medications   Acute liver failure secondary to unintentional tylenol overdose and ETOH abuse  Mild hyperbilirubinemia - Trend hepatic function panel - Avoid hepatotoxic medications  - Per poison control recommendations: repeat CMP/PT/INR/Acetaminophen level 22hrs following initial check; aggressive re-hydration; acetylcysteine for 24 hrs; received antizol 15 mg/kg x1 dose/maintain K+ @4  and Mag @2   - GI consulted recommended transfer to  tertiary center if pt requires liver  transplant  - [place] Abd Limited with Liver Doppler once extubated   Sepsis possibly secondary to acalculous cholecystitis and 4 mm obstructing left ureteral calculus  - Trend WBC and monitor fever curve - Trend PCT  - Follow cultures  - Continue zosyn and vancomycin for now  - General surgery consulted: low suspicion for acalculous cholecystitis, however if further assessment need proceed with HIDA scan.  HIDA scan pending  - Urology consulted: pt too unstable for ureteral stent placement at this time, low suspicion stone is source of decompensation. If pt stabilizes will proceed with stent placement if deemed necessary   Thrombocytopenia and elevated PT/INR in the setting of acute liver failure  Melena  - Trend CBC  - Monitor for s/sx of bleeding and transfuse for hgb <7 - VTE px: SCD's; avoid chemical px for now   Hypoglycemia  - CBG's q4hrs  - Follow hypo/hyperglycemic protocol   Acute toxic metabolic encephalopathy  Hx: Seizure disorder, bipolar 1 disorder, anxiety, depression, ETOH use, and chronic pain  - Correct metabolic derangements  - Hold outpatient olanzapine, trazodone, and lamictal for now  - Thiamine, folic acid, and MVI  - WUA daily once parameters met  - Seizure precautions  - Monitor for ETOH withdrawal   Best Practice (right click and "Reselect all SmartList Selections" daily)   Diet/type: NPO DVT prophylaxis: SCD GI prophylaxis: PPI Lines: Central line Foley:  Yes, and it is still needed Code Status:  full code Last date of multidisciplinary goals of care discussion [05/02/21]  : Pts sister Georgeanna Lea updated at bedside regarding pt condition and current plan of care. Labs   CBC: Recent Labs  Lab 05/23/2022 2232  WBC 11.7*  NEUTROABS 9.4*  HGB 13.1  HCT 40.2  MCV 116.2*  PLT 102*    Basic Metabolic Panel: Recent Labs  Lab  2232  NA 128*  K 3.1*  CL 92*  CO2 13*  GLUCOSE 85  BUN 32*   CREATININE 1.85*  CALCIUM 8.2*  MG 1.5*   GFR: Estimated Creatinine Clearance: 27.8 mL/min (A) (by C-G formula based on SCr of 1.85 mg/dL (H)). Recent Labs  Lab 05/08/2022 2232 05/01/2022 0005  WBC 11.7*  --   LATICACIDVEN >9.0* >9.0*    Liver Function Tests: Recent Labs  Lab 05/15/2022 2232  AST 1,205*  ALT 252*  ALKPHOS 114  BILITOT 1.6*  PROT 6.4*  ALBUMIN 3.2*   Recent Labs  Lab 05/26/2022 0005  LIPASE 25   No results for input(s): "AMMONIA" in the last 168 hours.  ABG No results found for: "PHART", "PCO2ART", "PO2ART", "HCO3", "TCO2", "ACIDBASEDEF", "O2SAT"   Coagulation Profile: Recent Labs  Lab 05/17/2022 0005  INR 1.9*    Cardiac Enzymes: No results for input(s): "CKTOTAL", "CKMB", "CKMBINDEX", "TROPONINI" in the last 168 hours.  HbA1C: No results found for: "HGBA1C"  CBG: No results for input(s): "GLUCAP" in the last 168 hours.  Review of Systems:   Gen: Denies fever, chills, weight change, fatigue, night sweats HEENT: Denies blurred vision, double vision, hearing loss, tinnitus, sinus congestion, rhinorrhea, sore throat, neck stiffness, dysphagia PULM: Denies shortness of breath, cough, sputum production, hemoptysis, wheezing CV: Denies chest pain, edema, orthopnea, paroxysmal nocturnal dyspnea, palpitations GI: abdominal pain, nausea, vomiting, diarrhea, hematochezia, melena, constipation, change in bowel habits GU: Denies dysuria, hematuria, polyuria, oliguria, urethral discharge Endocrine: Denies hot or cold intolerance, polyuria, polyphagia or appetite change Derm: Denies rash, dry skin, scaling or peeling skin change Heme: Denies easy bruising, bleeding, bleeding  gums Neuro: Denies headache, numbness, weakness, slurred speech, loss of memory or consciousness  Past Medical History:  She,  has a past medical history of Anxiety, Arthritis, Bipolar 1 disorder ([place]), Chronic pain, Depression, Eczema, Fibromyalgia, GERD (gastroesophageal reflux  disease), Headache, History of hiatal hernia, Neuromuscular disorder ([place]), and Seizures ([place]).   Surgical History:   Past Surgical History:  Procedure Laterality Date   ABDOMINAL HYSTERECTOMY     APPENDECTOMY     BRAIN TUMOR EXCISION  1985   SHOULDER HEMI-ARTHROPLASTY Right 11/14/2018   Procedure: SHOULDER HEMI-ARTHROPLASTY;  Surgeon: Corky Mull, MD;  Location: [facility] ORS;  Service: Orthopedics;  Laterality: Right;   TUBAL LIGATION       Social History:   reports that she has been smoking cigarettes. She has been smoking an average of .25 packs per day. She has never used smokeless tobacco. She reports current alcohol use of about 2.0 standard drinks of alcohol per week. She reports that she does not use drugs.   Family History:  Her family history includes Breast cancer in her mother.   Allergies Allergies  Allergen Reactions   Compazine [Prochlorperazine Edisylate] Other (See Comments)    euphoria   Ivp Dye [Iodinated Contrast Media] Anaphylaxis    "stopped her heart"   Baclofen Other (See Comments)    dizziness   Imitrex [Sumatriptan] Swelling    "throat swelling"   Voltaren [Diclofenac] Other (See Comments)    Cannot hold onto objects (drops things)     Home Medications  Prior to Admission medications   Medication Sig Start Date End Date Taking? Authorizing Provider  atorvastatin (LIPITOR) 20 MG tablet Take 20 mg by mouth daily. 09/16/21 09/16/22  [provider]  diphenhydramine-acetaminophen (TYLENOL PM) 25-500 MG TABS tablet Take 2 tablets by mouth at bedtime as needed (sleep).     [provider]  folic acid (FOLVITE) 1 MG tablet Take 1 tablet (1 mg total) by mouth daily. 11/07/20   Hosie Poisson, MD  lamoTRIgine (LAMICTAL) 100 MG tablet Take 100 mg by mouth 2 (two) times daily. 09/19/20   [provider]  Multiple Vitamin (MULTIVITAMIN WITH MINERALS) TABS tablet Take 1 tablet by mouth daily. 11/07/20   Hosie Poisson, MD  OLANZapine (ZYPREXA)  2.5 MG tablet Take 1 tablet (2.5 mg total) by mouth 2 (two) times daily. Patient taking differently: Take 2.5 mg by mouth every evening. 01/22/19   Shelly Coss, MD  pantoprazole (PROTONIX) 20 MG tablet Take 20 mg by mouth. 02/17/22   [provider]  propranolol (INDERAL) 40 MG tablet Take 40 mg by mouth 2 (two) times daily. 08/29/20   [provider]  senna-docusate (SENOKOT-S) 8.6-50 MG tablet Take 1 tablet by mouth at bedtime as needed for mild constipation. 11/06/20   Hosie Poisson, MD  thiamine 100 MG tablet Take 1 tablet (100 mg total) by mouth daily. 11/07/20   Hosie Poisson, MD  traZODone (DESYREL) 150 MG tablet Take 150 mg by mouth at bedtime. 08/13/20   [provider]     Critical care time: 64 minutes       [name], [place] Pager [phone] (please enter 7 digits) PCCM Consult Pager [phone] (please enter 7 digits)

## 2022-05-02 NOTE — Anesthesia Procedure Notes (Signed)
Date/Time: May 12, 2022 3:08 PM  Performed by: Johnna Acosta, CRNAPre-anesthesia Checklist: Patient identified, Emergency Drugs available, Suction available, Patient being monitored and Timeout performed Patient Re-evaluated:Patient Re-evaluated prior to induction Oxygen Delivery Method: Circle system utilized Preoxygenation: Pre-oxygenation with 100% oxygen Comments: PT already intubated with vent in use.

## 2022-05-02 NOTE — Procedures (Signed)
Arterial Catheter Insertion Procedure Note  Ariel White  [id number]  12-05-1960  Date:05/15/2022  Time:5:05 PM    Provider Performing: Teressa Lower    Procedure: Insertion of Arterial Line [phone]) with US guidance ([id number])   Indication(s) Blood pressure monitoring and/or need for frequent ABGs  Consent Risks of the procedure as well as the alternatives and risks of each were explained to the patient and/or caregiver.  Consent for the procedure was obtained and is signed in the bedside chart  Anesthesia None   Time Out Verified patient identification, verified procedure, site/side was marked, verified correct patient position, special equipment/implants available, medications/allergies/relevant history reviewed, required imaging and test results available.   Sterile Technique Maximal sterile technique including full sterile barrier drape, hand hygiene, sterile gown, sterile gloves, mask, hair covering, sterile ultrasound probe cover (if used).   Procedure Description Area of catheter insertion was cleaned with chlorhexidine and draped in sterile fashion. With real-time ultrasound guidance an arterial catheter was placed into the right radial artery.  Appropriate arterial tracings confirmed on monitor.     Complications/Tolerance None; patient tolerated the procedure well.   EBL Minimal   Specimen(s) None  Ariel White, AGNP  Pulmonary/Critical Care Pager [phone] (please enter 7 digits) PCCM Consult Pager [phone] (please enter 7 digits)

## 2022-05-02 NOTE — Progress Notes (Signed)
Pharmacy Antibiotic Note  Ariel White is a 62 y.o. female w/ PMH of seizures, GERD, BPD admitted on 05/11/2022 with shortness of breath, abdominal pain, generalized weakness, and dark tarry stools.  Pharmacy has been consulted for vancomycin and Zosyn dosing. Renal function has improved since addmission  Plan:   1) start  Zosyn 3.375g IV q8h (4 hour infusion).  2) start vancomycin 1250 mg IV x 1 then 750 mg IV every 24 hrs.  Goal AUC 400-550. Expected AUC: 455.5 SCr used: 1.13 mg/dL Ke 0.042 h-1, T1/2 16.4h   Height: 5\' 4"  (162.6 cm) Weight: 54.4 kg (120 lb) IBW/kg (Calculated) : 54.7  Temp (24hrs), Avg:95.5 F (35.3 C), Min:93.4 F (34.1 C), Max:97.6 F (36.4 C)  Recent Labs  Lab 05/19/2022 2232 05/22/2022 0005  0611 05/05/2022 0614  WBC 11.7*  --   --   --   CREATININE 1.85*  --   --  1.13*  LATICACIDVEN >9.0* >9.0* >9.0*  --     Estimated Creatinine Clearance: 45.5 mL/min (A) (by C-G formula based on SCr of 1.13 mg/dL (H)).    Allergies  Allergen Reactions   Compazine [Prochlorperazine Edisylate] Other (See Comments)    euphoria   Ivp Dye [Iodinated Contrast Media] Anaphylaxis    "stopped her heart"   Baclofen Other (See Comments)    dizziness   Imitrex [Sumatriptan] Swelling    "throat swelling"   Voltaren [Diclofenac] Other (See Comments)    Cannot hold onto objects (drops things)    Antimicrobials this admission: 01/06 cefepime, metronidazole x 1 01/07 vancomycin >>  01/07 Zosyn >>   Microbiology results: 01/06 BCx: NGTD  01/07 Sputum: pending  01/07 MRSA PCR: negative  Thank you for allowing pharmacy to be a part of this patient's care.  Ariel White 04/29/2022 8:52 AM

## 2022-05-02 NOTE — Consult Note (Addendum)
[facility] GI Inpatient Consult Note   Jamey Reas, M.D.  Reason for Consult: "Liver failure"   Attending Requesting Consult: Raechel Chute, M.D.  Outpatient Primary Physician: Carren Rang, PA [facility])  History of Present Illness: Ariel White is a 62 y.o. female with history of alcohol use presents with hypotension in the setting of an apparent therapeutic misadventure after taking frequent doses of tylenol. Patient noted by admitting team to have AKI, hypovolemic shock, possible septic shock, acute respiratory failure in setting of increased AG acidosis, and likely "Liver failure" from reported unintentional tylenol overdose. Patient is currently sedated on mechanical ventilation and cannot give a history. There was some reported melena by the patient but none witnessed and Hgb is 13.1.  Discussed case with assigned ICU nurse, Irving Burton, who who took patient in transfer this morning from the emergency room at the time patient was agitated and somewhat lethargic but did follow simple commands prior to sedation and ventilation.  Past Medical History:  Past Medical History:  Diagnosis Date   Anxiety    Arthritis    Bipolar 1 disorder (HCC)    Chronic pain    Depression    Eczema    Fibromyalgia    GERD (gastroesophageal reflux disease)    Headache    migraines   History of hiatal hernia    Neuromuscular disorder (HCC)    Seizures (HCC)     Problem List: Patient Active Problem List   Diagnosis Date Noted   Acute GI bleeding 04/30/2022   Falls frequently 10/30/2020   Alcohol abuse with withdrawal (HCC) 10/29/2020   Frequent falls 10/29/2020   Right supracondylar humerus fracture, closed, initial encounter 10/29/2020   Fibula fracture 03/02/2019   Closed right fibular fracture 03/02/2019   Bipolar 1 disorder (HCC)    GERD (gastroesophageal reflux disease)    Seizures (HCC)    Syncope 01/15/2019   Altered mental state 01/14/2019   Tobacco abuse  01/14/2019   Anxiety 01/14/2019   Rhabdomyolysis 01/14/2019   Macrocytosis 01/14/2019   Status post fall 01/14/2019   Fall    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) 11/14/2018   Fibromyalgia 09/17/2015   Myofascial pain syndrome 08/29/2015   DDD (degenerative disc disease), cervical 08/29/2015   Pain in joint, shoulder region 08/29/2015   Chronic pain 08/29/2015   Depression, unspecified 02/01/2015   Alcohol use disorder, mild, abuse 02/01/2015   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    Past Surgical History: Past Surgical History:  Procedure Laterality Date   ABDOMINAL HYSTERECTOMY     APPENDECTOMY     BRAIN TUMOR EXCISION  1985   SHOULDER HEMI-ARTHROPLASTY Right 11/14/2018   Procedure: SHOULDER HEMI-ARTHROPLASTY;  Surgeon: Christena Flake, MD;  Location: [facility] ORS;  Service: Orthopedics;  Laterality: Right;   TUBAL LIGATION      Allergies: Allergies  Allergen Reactions   Compazine [Prochlorperazine Edisylate] Other (See Comments)    euphoria   Ivp Dye [Iodinated Contrast Media] Anaphylaxis    "stopped her heart"   Baclofen Other (See Comments)    dizziness   Imitrex [Sumatriptan] Swelling    "throat swelling"   Voltaren [Diclofenac] Other (See Comments)    Cannot hold onto objects (drops things)    Home Medications: Medications Prior to Admission  Medication Sig Dispense Refill Last Dose   atorvastatin (LIPITOR) 20 MG tablet Take 20 mg by mouth daily.      diphenhydramine-acetaminophen (TYLENOL PM) 25-500 MG TABS  tablet Take 2 tablets by mouth at bedtime as needed (sleep).       folic acid (FOLVITE) 1 MG tablet Take 1 tablet (1 mg total) by mouth daily.      lamoTRIgine (LAMICTAL) 100 MG tablet Take 100 mg by mouth 2 (two) times daily.      Multiple Vitamin (MULTIVITAMIN WITH MINERALS) TABS tablet Take 1 tablet by mouth daily.      OLANZapine (ZYPREXA) 2.5 MG tablet Take 1 tablet (2.5 mg total) by mouth 2 (two) times daily.  (Patient taking differently: Take 2.5 mg by mouth every evening.) 60 tablet 0    pantoprazole (PROTONIX) 20 MG tablet Take 20 mg by mouth.      propranolol (INDERAL) 40 MG tablet Take 40 mg by mouth 2 (two) times daily.      senna-docusate (SENOKOT-S) 8.6-50 MG tablet Take 1 tablet by mouth at bedtime as needed for mild constipation.      thiamine 100 MG tablet Take 1 tablet (100 mg total) by mouth daily.      traZODone (DESYREL) 150 MG tablet Take 150 mg by mouth at bedtime.      Home medication reconciliation was completed with the patient.   Scheduled Inpatient Medications:    Chlorhexidine Gluconate Cloth  6 each Topical Daily   docusate  100 mg Per Tube BID   fentaNYL       folic acid  1 mg Intravenous Daily   multivitamin  15 mL Per Tube Daily   mouth rinse  15 mL Mouth Rinse Q2H   pantoprazole (PROTONIX) IV  40 mg Intravenous Q12H   polyethylene glycol  17 g Per Tube Daily   thiamine (VITAMIN B1) injection  100 mg Intravenous Daily    Continuous Inpatient Infusions:    sodium chloride     acetylcysteine 15 mg/kg/hr (05/22/2022 0906)   fentaNYL infusion INTRAVENOUS 50 mcg/hr (05/14/2022 1000)   lactated ringers Stopped (05/18/2022 0020)   magnesium sulfate bolus IVPB 2 g (05/13/2022 1022)   norepinephrine (LEVOPHED) Adult infusion 36 mcg/min (05/18/2022 1034)   piperacillin-tazobactam (ZOSYN)  IV 3.375 g (05/17/2022 1111)   vancomycin     vasopressin Stopped (05/25/2022 0930)    PRN Inpatient Medications:  docusate sodium, fentaNYL, fentaNYL, midazolam, mouth rinse, polyethylene glycol  Family History: family history includes Breast cancer in her mother.   GI Family History: Unknown.  Social History:   reports that she has been smoking cigarettes. She has been smoking an average of .25 packs per day. She has never used smokeless tobacco. She reports current alcohol use of about 2.0 standard drinks of alcohol per week. She reports that she does not use drugs. The patient denies  ETOH, tobacco, or drug use.    Review of Systems: Review of Systems -  Unobtainable from obtunded, sedated paient.   Physical Examination: BP (!) 106/39   Pulse (!) 125   Temp (!) 95.2 F (35.1 C)   Resp (!) 26   Ht 5\' 4"  (1.626 m)   Wt 55 kg   SpO2 97%   BMI 20.81 kg/m  Physical Exam Constitutional:      Appearance: She is ill-appearing and toxic-appearing.     Comments: Intubated on mechanical ventilation, sedated, non-conversive.  HENT:     Head: Normocephalic and atraumatic.     Nose: Nose normal.     Mouth/Throat:     Mouth: Mucous membranes are dry.  Eyes:     General: No scleral icterus.  Pupils: Pupils are equal, round, and reactive to light.  Cardiovascular:     Rate and Rhythm: Tachycardia present.     Pulses: Normal pulses.  Pulmonary:     Breath sounds: Rhonchi present.  Abdominal:     General: Bowel sounds are normal. There is no distension.     Palpations: Abdomen is soft. There is no mass.     Tenderness: There is no abdominal tenderness. There is no guarding or rebound.     Hernia: No hernia is present.  Musculoskeletal:        General: No swelling or tenderness.  Skin:    Capillary Refill: Capillary refill takes less than 2 seconds.     Coloration: Skin is pale. Skin is not jaundiced.  Psychiatric:     Comments: Sedated, not following commands.     Data: Lab Results  Component Value Date   WBC 11.7 (H) 05/16/2022   HGB 13.1 05/20/2022   HCT 40.2 05/21/2022   MCV 116.2 (H) 04/27/2022   PLT 102 (L) 05/25/2022   Recent Labs  Lab 05/10/2022 2232  HGB 13.1   Lab Results  Component Value Date   NA 134 (L) 05/13/2022   K 4.1 05/21/2022   CL 105 05/21/2022   CO2 <7 (L) May 04, 2022   BUN 23 (H) May 04, 2022   CREATININE 1.13 (H) May 04, 2022   Lab Results  Component Value Date   ALT 315 (H) 05/22/2022   AST 1,616 (H) May 04, 2022   ALKPHOS 98 May 04, 2022   BILITOT 1.5 (H) 04/29/2022   Recent Labs  Lab May 04, 2022 0005  APTT 43*  INR 1.9*       Latest Ref Rng & Units 05/14/2022   10:32 PM 10/31/2020    7:11 AM 10/30/2020    6:57 AM  CBC  WBC 4.0 - 10.5 K/uL 11.7  5.3  5.8   Hemoglobin 12.0 - 15.0 g/dL 54.0  08.6  76.1   Hematocrit 36.0 - 46.0 % 40.2  35.3  35.3   Platelets 150 - 400 K/uL 102  167  186     STUDIES: DG Chest Port 1 View  Result Date: 05/06/2022 CLINICAL DATA:  Endotracheal and OG tube placement. EXAM: PORTABLE CHEST 1 VIEW COMPARISON:  Earlier same day FINDINGS: 0847 hours. Endotracheal tube tip is 4.9 cm above the base of the carina. The NG tube passes into the stomach although the distal tip position is not included on the film. Right IJ central line tip overlies the distal SVC level. The lungs are clear without focal pneumonia, edema, pneumothorax or pleural effusion. The cardiopericardial silhouette is within normal limits for size. Status post right shoulder replacement. Telemetry leads overlie the chest. IMPRESSION: 1. Support apparatus as described. 2. No acute cardiopulmonary findings. Electronically Signed   By: Kennith Center M.D.   On: May 04, 2022 09:16   DG Chest 1 View  Result Date: 05/26/2022 CLINICAL DATA:  Encounter for central line EXAM: CHEST  1 VIEW COMPARISON:  Yesterday FINDINGS: Right IJ line with tip at the upper cavoatrial junction. Interface from artifact over the right apex with no pleural line, no suspected pneumothorax. Normal heart size and mediastinal contours. Symmetric aeration. Right shoulder replacement. IMPRESSION: New central line without complicating features. Electronically Signed   By: Tiburcio Pea M.D.   On: 05/25/2022 08:01   MR ABDOMEN MRCP W WO CONTAST  Result Date: 04-May-2022 CLINICAL DATA:  Gallbladder wall thickening on prior imaging. EXAM: MRI ABDOMEN WITHOUT AND WITH CONTRAST (INCLUDING MRCP) TECHNIQUE: Multiplanar multisequence  MR imaging of the abdomen was performed both before and after the administration of intravenous contrast. Heavily T2-weighted images of the  biliary and pancreatic ducts were obtained, and three-dimensional MRCP images were rendered by post processing. CONTRAST:  15mL GADAVIST GADOBUTROL 1 MMOL/ML IV SOLN COMPARISON:  CT and ultrasound exams from earlier the same day FINDINGS: Lower chest: Small bilateral effusions. Hepatobiliary: Liver measures 17.6 cm craniocaudal length, enlarged. Diffuse loss of signal intensity in the liver parenchyma on out of phase T1 imaging is compatible with fatty deposition. Imaging of the liver is markedly motion degraded and smaller subtle lesions could easily be obscured by the artifact. Within this limitation no gross hepatic mass lesion is discernible. No substantial intrahepatic biliary duct dilatation. As noted on the prior studies, there is marked edema and wall thickening of the gallbladder wall with a nondistended lumen. As noted, postcontrast imaging is markedly degraded, but no discrete enhancing mass lesion is discernible within the gallbladder although the mucosa is diffusely hyperenhancing. No gallstones are identified. Common bile duct is nondilated at 3 mm diameter. No evidence of choledocholithiasis. Pancreas: Pancreas appears to have some edema in the parenchyma with associated edema in the retroperitoneal tissues. Imaging of the pancreas is nondiagnostic on pre and postcontrast T1 imaging due to motion artifact Spleen:  No splenomegaly. No focal mass lesion. Adrenals/Urinary Tract: No adrenal nodule or mass. Kidneys unremarkable. Stomach/Bowel: Stomach is moderately distended with gas and fluid. Duodenal wall appears thick with edema. Small bowel loops in the visualized upper abdomen appear edematous. Vascular/Lymphatic: No abdominal aortic aneurysm. Portal vein and superior mesenteric vein show normal flow signal characteristics compatible with patency. Splenic vein shows diffuse enhancement after contrast administration suggesting patency. Other: There is extensive edema around the gallbladder, in the  hepatoduodenal ligament and, and in the retroperitoneal space. Small volume free fluid is seen around the liver and spleen with retroperitoneal edema/fluid noted bilaterally. Musculoskeletal: No focal suspicious marrow enhancement within the visualized bony anatomy. IMPRESSION: 1. Marked edema and wall thickening of the gallbladder wall with a nondistended lumen. No evidence for gallstones an no discrete enhancing mass lesion is discernible within the gallbladder although the mucosa is diffusely hyperenhancing after contrast administration. Gallbladder wall thickening/edema is considered nonspecific in this study given no discernible gallstones and can be seen in the setting of infection/inflammation, volume overload, liver failure, and heart failure. 2. No substantial intrahepatic or extrahepatic biliary duct dilatation. No evidence of choledocholithiasis. 3. Pancreas appears to have some edema in the parenchyma with associated edema in the retroperitoneal tissues. Imaging of the pancreas is nondiagnostic on pre and postcontrast T1 imaging due to motion artifact. 4. Fairly extensive edema in the gallbladder fossa, hepatoduodenal ligament, retroperitoneal tissues. Jejunal small bowel loops in the upper abdomen appear edematous. 5. Small volume free fluid around the liver and spleen with retroperitoneal edema/fluid noted bilaterally. 6. Hepatomegaly with fatty deposition. 7. Small bilateral effusions. Electronically Signed   By: Kennith Center M.D.   On: 05/08/2022 05:46   MR 3D Recon At Scanner  Result Date: 05/25/2022 CLINICAL DATA:  Gallbladder wall thickening on prior imaging. EXAM: MRI ABDOMEN WITHOUT AND WITH CONTRAST (INCLUDING MRCP) TECHNIQUE: Multiplanar multisequence MR imaging of the abdomen was performed both before and after the administration of intravenous contrast. Heavily T2-weighted images of the biliary and pancreatic ducts were obtained, and three-dimensional MRCP images were rendered by post  processing. CONTRAST:  48mL GADAVIST GADOBUTROL 1 MMOL/ML IV SOLN COMPARISON:  CT and ultrasound exams from earlier the same  day FINDINGS: Lower chest: Small bilateral effusions. Hepatobiliary: Liver measures 17.6 cm craniocaudal length, enlarged. Diffuse loss of signal intensity in the liver parenchyma on out of phase T1 imaging is compatible with fatty deposition. Imaging of the liver is markedly motion degraded and smaller subtle lesions could easily be obscured by the artifact. Within this limitation no gross hepatic mass lesion is discernible. No substantial intrahepatic biliary duct dilatation. As noted on the prior studies, there is marked edema and wall thickening of the gallbladder wall with a nondistended lumen. As noted, postcontrast imaging is markedly degraded, but no discrete enhancing mass lesion is discernible within the gallbladder although the mucosa is diffusely hyperenhancing. No gallstones are identified. Common bile duct is nondilated at 3 mm diameter. No evidence of choledocholithiasis. Pancreas: Pancreas appears to have some edema in the parenchyma with associated edema in the retroperitoneal tissues. Imaging of the pancreas is nondiagnostic on pre and postcontrast T1 imaging due to motion artifact Spleen:  No splenomegaly. No focal mass lesion. Adrenals/Urinary Tract: No adrenal nodule or mass. Kidneys unremarkable. Stomach/Bowel: Stomach is moderately distended with gas and fluid. Duodenal wall appears thick with edema. Small bowel loops in the visualized upper abdomen appear edematous. Vascular/Lymphatic: No abdominal aortic aneurysm. Portal vein and superior mesenteric vein show normal flow signal characteristics compatible with patency. Splenic vein shows diffuse enhancement after contrast administration suggesting patency. Other: There is extensive edema around the gallbladder, in the hepatoduodenal ligament and, and in the retroperitoneal space. Small volume free fluid is seen around  the liver and spleen with retroperitoneal edema/fluid noted bilaterally. Musculoskeletal: No focal suspicious marrow enhancement within the visualized bony anatomy. IMPRESSION: 1. Marked edema and wall thickening of the gallbladder wall with a nondistended lumen. No evidence for gallstones an no discrete enhancing mass lesion is discernible within the gallbladder although the mucosa is diffusely hyperenhancing after contrast administration. Gallbladder wall thickening/edema is considered nonspecific in this study given no discernible gallstones and can be seen in the setting of infection/inflammation, volume overload, liver failure, and heart failure. 2. No substantial intrahepatic or extrahepatic biliary duct dilatation. No evidence of choledocholithiasis. 3. Pancreas appears to have some edema in the parenchyma with associated edema in the retroperitoneal tissues. Imaging of the pancreas is nondiagnostic on pre and postcontrast T1 imaging due to motion artifact. 4. Fairly extensive edema in the gallbladder fossa, hepatoduodenal ligament, retroperitoneal tissues. Jejunal small bowel loops in the upper abdomen appear edematous. 5. Small volume free fluid around the liver and spleen with retroperitoneal edema/fluid noted bilaterally. 6. Hepatomegaly with fatty deposition. 7. Small bilateral effusions. Electronically Signed   By: Kennith Center M.D.   On: 05/21/2022 05:46   US ABDOMEN LIMITED RUQ (LIVER/GB)  Result Date: 05/17/2022 CLINICAL DATA:  Right upper quadrant abdominal pain EXAM: ULTRASOUND ABDOMEN LIMITED RIGHT UPPER QUADRANT COMPARISON:  None Available. FINDINGS: Gallbladder: There is marked thickening of the gallbladder wall which measures up to 12 mm in thickness. The gallbladder, however, is largely decompressed. No pericholecystic fluid identified. No intraluminal stones or sludge is seen. The sonographic Eulah Pont sign is reportedly POSITIVE. Common bile duct: Diameter: 4 mm in proximal diameter Liver:  Normal hepatic parenchymal echogenicity and echotexture. No focal intrahepatic masses are seen and there is no intrahepatic biliary ductal dilation. Portal vein is patent on color Doppler imaging with normal direction of blood flow towards the liver. Other: None. IMPRESSION: 1. Marked thickening of the gallbladder wall, nonspecific. This can be seen in the setting of hepatic or biliary inflammation, volume  overload/anasarca, or cardiogenic failure. Correlation with liver enzymes may be helpful aeration. Electronically Signed   By: Fidela Salisbury M.D.   On: 04/28/2022 02:39   CT ABDOMEN PELVIS WO CONTRAST  Result Date: 05/26/2022 CLINICAL DATA:  Abdominal pain, acute, nonlocalized EXAM: CT ABDOMEN AND PELVIS WITHOUT CONTRAST TECHNIQUE: Multidetector CT imaging of the abdomen and pelvis was performed following the standard protocol without IV contrast. RADIATION DOSE REDUCTION: This exam was performed according to the departmental dose-optimization program which includes automated exposure control, adjustment of the mA and/or kV according to patient size and/or use of iterative reconstruction technique. COMPARISON:  01/14/2019 FINDINGS: Lower chest: No acute abnormality. Hepatobiliary: At least mild hepatic steatosis. No focal intrahepatic mass identified on this noncontrast examination. Focal fatty hepatic infiltration adjacent the falciform ligament. The gallbladder wall is thickened and there is infiltration within the porta hepatis which may reflect changes of a inflammatory process involving the biliary tree, head of the pancreas, or second portion of the duodenum. No loculated perihepatic fluid collections are identified. No definite intra or extrahepatic biliary ductal dilation. The gallbladder is not distended. Pancreas: As noted above, mild peripancreatic inflammatory changes are identified surrounding the head of the pancreas centered within the pancreatico duodenal groove and extending into the porta  hepatis. The body and tail the pancreas are unremarkable. The pancreatic duct is not dilated. No peripancreatic loculated fluid collections are identified. No parenchymal calcifications are seen. Spleen: Unremarkable Adrenals/Urinary Tract: The adrenal glands are unremarkable. The kidneys are normal in size and position. Mild left hydronephrosis and perinephric stranding has developed with a 4 mm calculus seen anterior to the left psoas, possibly within the proximal left ureter. Mild surrounding inflammatory stranding suggests periureteric stranding related to urothelial inflammation. No hydronephrosis on the right. There are bilateral nonobstructing renal calculi identified measuring up to 5 mm on the right and 2 mm on the left. No additional ureteral calculi. The bladder is unremarkable. Stomach/Bowel: Stomach is within normal limits. Appendix absent. No evidence of bowel wall thickening, distention, or inflammatory changes. Vascular/Lymphatic: Aortic atherosclerosis. No enlarged abdominal or pelvic lymph nodes. Reproductive: Uterus and bilateral adnexa are unremarkable. Other: No abdominal wall hernia. Musculoskeletal: Osseous structures are age-appropriate. No acute bone abnormality. No lytic or blastic bone lesion. Multiple healed right rib fractures again noted. IMPRESSION: 1. Mild left hydronephrosis and perinephric stranding probable obstructing 4 mm calculus within the proximal left ureter. Mild surrounding inflammatory stranding suggests periureteric stranding related to urothelial inflammation. Correlation with urinalysis and urine culture may be helpful. Superimposed mild bilateral nonobstructing nephrolithiasis. 2. Mild hepatic steatosis. 3. Gallbladder wall thickening and soft tissue infiltration within the porta hepatis. This may reflect changes of a inflammatory process involving the biliary tree, head of the pancreas, or second portion of the duodenum. Correlation with liver enzymes and serum  amylase and lipase levels may be helpful for further evaluation. 4. Aortic atherosclerosis. Aortic Atherosclerosis (ICD10-I70.0). Electronically Signed   By: Fidela Salisbury M.D.   On: 05/21/2022 00:52   DG Chest Port 1 View  Result Date: 2022-05-16 CLINICAL DATA:  Questionable sepsis EXAM: PORTABLE CHEST 1 VIEW COMPARISON:  Chest x-ray 10/29/2020 FINDINGS: The heart size and mediastinal contours are within normal limits. Both lungs are clear. Right shoulder arthroplasty again noted. No acute fractures. IMPRESSION: No active disease. Electronically Signed   By: Ronney Asters M.D.   On: 05-16-22 22:52   @IMAGES @  Assessment: 1.  Perceived hepatic injury secondary to excessive Tylenol ingestion, further complicated by possible excessive alcohol  use.  Current picture suggest possible early liver failure.  Other potential additional disease processes may include viral hepatitis, autoimmune hepatitis, other toxic ingestion, previously undiagnosed cirrhosis with baseline hepatic decompensation. 2.  Encephalopathy-given current history, considered possible hepatic encephalopathy.  Also consider other toxic/metabolic/hypoxic encephalopathy.  Consider other less likely causes such as syphilis, hypothyroidism. 3.  Elevated liver associated enzymes-consider shock liver, acute viral hepatitis, toxin associated hepatic necrosis (Tylenol, alcohol, sepsis, etc.).  Also consider biliary causes although these appear less likely, budd chiari, acalculous cholecystitis, etc.).    Recommendations:  Check RPR, ANA, ASMA, AMA, TSH. Check serum Ammonia level, phosphate. Case discussed with ICU attending DR. Dgayli. It appears [facility] team has denied potential transfer for hepatic failure and [facility] is unable to accept due to full bed situation. In this case, continue supportive measures and management of anasarca and hemodynamics as deemed indicated. Will follow along.  Thank you for the consult. Please call with  questions or concerns.  Rosina Lowensteinoledo, Tylor Gambrill, "Mellody DanceKeith" MD [facility] [address] [place], [place] [address] [phone]   11:17 AM

## 2022-05-02 NOTE — Progress Notes (Signed)
Discussed patient's case with our GI colleagues who recommended transferring Ariel White to a liver transplant center. I called [facility] and presented the case to them. They discussed the patient with the medical director and declined to transfer the patient to their ICU. I called [facility] and they reported they are at capacity and that they do not carry a waiting list; they would not be able to transfer the patient. I will attempt to call [facility] next.  Armando Reichert, MD [facility] 05/07/2022 12:16 PM

## 2022-05-02 NOTE — Procedures (Signed)
Central Venous Catheter Insertion Procedure Note  Ariel White  [id number]  October 07, 1960  Date:05/01/2022  Time:7:38 AM   Provider Performing:Ariel White A Ariel White   Procedure: Insertion of Non-tunneled Central Venous Catheter(36556) with US guidance (65784)   Indication(s) Medication administration and Difficult access  Consent Unable to obtain consent due to emergent nature of procedure.  Anesthesia Topical only with 1% lidocaine   Timeout Verified patient identification, verified procedure, site/side was marked, verified correct patient position, special equipment/implants available, medications/allergies/relevant history reviewed, required imaging and test results available.  Sterile Technique Maximal sterile technique including full sterile barrier drape, hand hygiene, sterile gown, sterile gloves, mask, hair covering, sterile ultrasound probe cover (if used).  Procedure Description Area of catheter insertion was cleaned with chlorhexidine and draped in sterile fashion.  With real-time ultrasound guidance a central venous catheter was placed into the right internal jugular vein. Nonpulsatile blood flow and easy flushing noted in all ports.  The catheter was sutured in place and sterile dressing applied.  Complications/Tolerance None; patient tolerated the procedure well. Chest X-ray is ordered to verify placement for internal jugular or subclavian cannulation.   Chest x-ray is not ordered for femoral cannulation.  EBL Minimal  Specimen(s) None   Ariel Falco, DNP, CCRN, FNP-C, AGACNP-BC Acute Care & Family Nurse Practitioner  [facility]  See Amion for personal pager PCCM on call pager [phone] until 7 am

## 2022-05-02 NOTE — Interval H&P Note (Signed)
History and Physical Interval Note: In interim, patient has been stabilized in the ICU.  Urology was called again to reconsider left ureteral stent. Her prelim blood cultures show e. Coli.  ICU intensivist cannot identify another source at the moment.  She remains intubated on blood pressure support.  Prognosis guarded.  Discussed risk of possible death from anesthesia and/or procedure. Informed consent obtained from sister [name].  25-May-2022 2:42 PM  [name]  has presented today for surgery, with the diagnosis of ureteral stone.  The various methods of treatment have been discussed with the patient and family. After consideration of risks, benefits and other options for treatment, the patient has consented to  Procedure(s): CYSTOSCOPY WITH RETROGRADE PYELOGRAM/URETERAL STENT PLACEMENT (Left) as a surgical intervention.  The patient's history has been reviewed, patient examined, no change in status, stable for surgery.  I have reviewed the patient's chart and labs.  Questions were answered to the patient's satisfaction.     Oluchi Pucci D Bentlee [place]

## 2022-05-02 NOTE — Progress Notes (Signed)
PHARMACY - PHYSICIAN COMMUNICATION CRITICAL VALUE ALERT - BLOOD CULTURE IDENTIFICATION (BCID)  Ariel White is an 62 y.o. female who presented to [facility] on  with shortness of breath, abdominal pain, generalized weakness, and dark tarry stools.   Assessment:  1 of 4 (anaerobic): E. coli  --no resistance detected  Name of physician (or Provider) Contacted: Rodman Comp, NP  Current antibiotics:  antimicrobials this admission: 01/06 cefepime, metronidazole x 1 01/07 vancomycin >>  01/07 Zosyn >>   Changes to prescribed antibiotics recommended:  no changes given unclear source at this time, organism is covered by this regimen  Results for orders placed or performed during the hospital encounter of 04/27/2022  Blood Culture ID Panel (Reflexed) (Collected: 05/16/2022 10:27 PM)  Result Value Ref Range   Enterococcus faecalis NOT DETECTED NOT DETECTED   Enterococcus Faecium NOT DETECTED NOT DETECTED   Listeria monocytogenes NOT DETECTED NOT DETECTED   Staphylococcus species NOT DETECTED NOT DETECTED   Staphylococcus aureus (BCID) NOT DETECTED NOT DETECTED   Staphylococcus epidermidis NOT DETECTED NOT DETECTED   Staphylococcus lugdunensis NOT DETECTED NOT DETECTED   Streptococcus species NOT DETECTED NOT DETECTED   Streptococcus agalactiae NOT DETECTED NOT DETECTED   Streptococcus pneumoniae NOT DETECTED NOT DETECTED   Streptococcus pyogenes NOT DETECTED NOT DETECTED   A.calcoaceticus-baumannii NOT DETECTED NOT DETECTED   Bacteroides fragilis NOT DETECTED NOT DETECTED   Enterobacterales DETECTED (A) NOT DETECTED   Enterobacter cloacae complex NOT DETECTED NOT DETECTED   Escherichia coli DETECTED (A) NOT DETECTED   Klebsiella aerogenes NOT DETECTED NOT DETECTED   Klebsiella oxytoca NOT DETECTED NOT DETECTED   Klebsiella pneumoniae NOT DETECTED NOT DETECTED   Proteus species NOT DETECTED NOT DETECTED   Salmonella species NOT DETECTED NOT DETECTED   Serratia marcescens NOT  DETECTED NOT DETECTED   Haemophilus influenzae NOT DETECTED NOT DETECTED   Neisseria meningitidis NOT DETECTED NOT DETECTED   Pseudomonas aeruginosa NOT DETECTED NOT DETECTED   Stenotrophomonas maltophilia NOT DETECTED NOT DETECTED   Candida albicans NOT DETECTED NOT DETECTED   Candida auris NOT DETECTED NOT DETECTED   Candida glabrata NOT DETECTED NOT DETECTED   Candida krusei NOT DETECTED NOT DETECTED   Candida parapsilosis NOT DETECTED NOT DETECTED   Candida tropicalis NOT DETECTED NOT DETECTED   Cryptococcus neoformans/gattii NOT DETECTED NOT DETECTED   CTX-M ESBL NOT DETECTED NOT DETECTED   Carbapenem resistance IMP NOT DETECTED NOT DETECTED   Carbapenem resistance KPC NOT DETECTED NOT DETECTED   Carbapenem resistance NDM NOT DETECTED NOT DETECTED   Carbapenem resist OXA 48 LIKE NOT DETECTED NOT DETECTED   Carbapenem resistance VIM NOT DETECTED NOT DETECTED    [name] 05/02/2022  12:28 PM

## 2022-05-02 NOTE — Progress Notes (Signed)
PHARMACY -  BRIEF ANTIBIOTIC NOTE   Pharmacy has received consult(s) for Vancomycin from an ED provider.  The patient's profile has been reviewed for ht/wt/allergies/indication/available labs.    One time order(s) placed for Vancomycin 1250 mg IV X 1   Further antibiotics/pharmacy consults should be ordered by admitting physician if indicated.                       Thank you, Ranyia Witting D 05-05-2022  6:37 AM

## 2022-05-03 ENCOUNTER — Encounter: Payer: Self-pay | Admitting: Urology

## 2022-05-03 DIAGNOSIS — K922 Gastrointestinal hemorrhage, unspecified: Secondary | ICD-10-CM | POA: Diagnosis not present

## 2022-05-03 LAB — BLOOD GAS, ARTERIAL
Acid-base deficit: 11.4 mmol/L — ABNORMAL HIGH (ref 0.0–2.0)
Bicarbonate: 12.1 mmol/L — ABNORMAL LOW (ref 20.0–28.0)
FIO2: 28 %
MECHVT: 450 mL
Mechanical Rate: 28
O2 Saturation: 98.9 %
PEEP: 5 cmH2O
Patient temperature: 37
pCO2 arterial: 22 mmHg — ABNORMAL LOW (ref 32–48)
pH, Arterial: 7.35 (ref 7.35–7.45)
pO2, Arterial: 94 mmHg (ref 83–108)

## 2022-05-03 LAB — PROTIME-INR
INR: >10 (ref 0.8–1.2)
INR: >10 (ref 0.8–1.2)
INR: 7.8 (ref 0.8–1.2)
Prothrombin Time: >90 seconds — ABNORMAL HIGH (ref 11.4–15.2)
Prothrombin Time: 83.6 seconds — ABNORMAL HIGH (ref 11.4–15.2)
Prothrombin Time: 65.1 seconds — ABNORMAL HIGH (ref 11.4–15.2)

## 2022-05-03 LAB — COMPREHENSIVE METABOLIC PANEL
ALT: 1708 U/L — ABNORMAL HIGH (ref 0–44)
AST: 9162 U/L — ABNORMAL HIGH (ref 15–41)
Albumin: 1.8 g/dL — ABNORMAL LOW (ref 3.5–5.0)
Alkaline Phosphatase: 122 U/L (ref 38–126)
Anion gap: 25 — ABNORMAL HIGH (ref 5–15)
BUN: 24 mg/dL — ABNORMAL HIGH (ref 6–20)
CO2: 16 mmol/L — ABNORMAL LOW (ref 22–32)
Calcium: 6.1 mg/dL — CL (ref 8.9–10.3)
Chloride: 90 mmol/L — ABNORMAL LOW (ref 98–111)
Creatinine, Ser: 2.01 mg/dL — ABNORMAL HIGH (ref 0.44–1.00)
GFR, Estimated: 28 mL/min — ABNORMAL LOW (ref >60)
Glucose, Bld: 406 mg/dL — ABNORMAL HIGH (ref 70–99)
Potassium: 2.9 mmol/L — ABNORMAL LOW (ref 3.5–5.1)
Sodium: 131 mmol/L — ABNORMAL LOW (ref 135–145)
Total Bilirubin: 3.5 mg/dL — ABNORMAL HIGH (ref 0.3–1.2)
Total Protein: 4.1 g/dL — ABNORMAL LOW (ref 6.5–8.1)

## 2022-05-03 LAB — CBC
HCT: 42.4 % (ref 36.0–46.0)
Hemoglobin: 14.6 g/dL (ref 12.0–15.0)
MCH: 35 pg — ABNORMAL HIGH (ref 26.0–34.0)
MCHC: 34.4 g/dL (ref 30.0–36.0)
MCV: 101.7 fL — ABNORMAL HIGH (ref 80.0–100.0)
Platelets: 57 10*3/uL — ABNORMAL LOW (ref 150–400)
RBC: 4.17 MIL/uL (ref 3.87–5.11)
RDW: 21.1 % — ABNORMAL HIGH (ref 11.5–15.5)
WBC: 9.7 10*3/uL (ref 4.0–10.5)
nRBC: 2.1 % — ABNORMAL HIGH (ref 0.0–0.2)

## 2022-05-03 LAB — HEPATIC FUNCTION PANEL
ALT: 1351 U/L — ABNORMAL HIGH (ref 0–44)
ALT: 2237 U/L — ABNORMAL HIGH (ref 0–44)
AST: 7017 U/L — ABNORMAL HIGH (ref 15–41)
AST: >10000 U/L — ABNORMAL HIGH (ref 15–41)
Albumin: 1.9 g/dL — ABNORMAL LOW (ref 3.5–5.0)
Albumin: 1.8 g/dL — ABNORMAL LOW (ref 3.5–5.0)
Alkaline Phosphatase: 124 U/L (ref 38–126)
Alkaline Phosphatase: 128 U/L — ABNORMAL HIGH (ref 38–126)
Bilirubin, Direct: 2.1 mg/dL — ABNORMAL HIGH (ref 0.0–0.2)
Bilirubin, Direct: 2.1 mg/dL — ABNORMAL HIGH (ref 0.0–0.2)
Indirect Bilirubin: 1.2 mg/dL — ABNORMAL HIGH (ref 0.3–0.9)
Indirect Bilirubin: 1.4 mg/dL — ABNORMAL HIGH (ref 0.3–0.9)
Total Bilirubin: 3.3 mg/dL — ABNORMAL HIGH (ref 0.3–1.2)
Total Bilirubin: 3.5 mg/dL — ABNORMAL HIGH (ref 0.3–1.2)
Total Protein: 4.1 g/dL — ABNORMAL LOW (ref 6.5–8.1)
Total Protein: 4.1 g/dL — ABNORMAL LOW (ref 6.5–8.1)

## 2022-05-03 LAB — GLUCOSE, CAPILLARY
Glucose-Capillary: 104 mg/dL — ABNORMAL HIGH (ref 70–99)
Glucose-Capillary: 126 mg/dL — ABNORMAL HIGH (ref 70–99)
Glucose-Capillary: 162 mg/dL — ABNORMAL HIGH (ref 70–99)
Glucose-Capillary: 168 mg/dL — ABNORMAL HIGH (ref 70–99)
Glucose-Capillary: 198 mg/dL — ABNORMAL HIGH (ref 70–99)
Glucose-Capillary: 225 mg/dL — ABNORMAL HIGH (ref 70–99)
Glucose-Capillary: 243 mg/dL — ABNORMAL HIGH (ref 70–99)
Glucose-Capillary: 278 mg/dL — ABNORMAL HIGH (ref 70–99)
Glucose-Capillary: 318 mg/dL — ABNORMAL HIGH (ref 70–99)
Glucose-Capillary: 324 mg/dL — ABNORMAL HIGH (ref 70–99)
Glucose-Capillary: 365 mg/dL — ABNORMAL HIGH (ref 70–99)
Glucose-Capillary: 404 mg/dL — ABNORMAL HIGH (ref 70–99)
Glucose-Capillary: 415 mg/dL — ABNORMAL HIGH (ref 70–99)
Glucose-Capillary: 426 mg/dL — ABNORMAL HIGH (ref 70–99)
Glucose-Capillary: 433 mg/dL — ABNORMAL HIGH (ref 70–99)
Glucose-Capillary: 71 mg/dL (ref 70–99)
Glucose-Capillary: 79 mg/dL (ref 70–99)
Glucose-Capillary: 92 mg/dL (ref 70–99)

## 2022-05-03 LAB — BASIC METABOLIC PANEL
Anion gap: 26 — ABNORMAL HIGH (ref 5–15)
Anion gap: 26 — ABNORMAL HIGH (ref 5–15)
BUN: 24 mg/dL — ABNORMAL HIGH (ref 6–20)
BUN: 24 mg/dL — ABNORMAL HIGH (ref 6–20)
CO2: 19 mmol/L — ABNORMAL LOW (ref 22–32)
CO2: 21 mmol/L — ABNORMAL LOW (ref 22–32)
Calcium: 6.1 mg/dL — CL (ref 8.9–10.3)
Calcium: 5.8 mg/dL — CL (ref 8.9–10.3)
Chloride: 86 mmol/L — ABNORMAL LOW (ref 98–111)
Chloride: 87 mmol/L — ABNORMAL LOW (ref 98–111)
Creatinine, Ser: 2.29 mg/dL — ABNORMAL HIGH (ref 0.44–1.00)
Creatinine, Ser: 2.4 mg/dL — ABNORMAL HIGH (ref 0.44–1.00)
GFR, Estimated: 24 mL/min — ABNORMAL LOW (ref >60)
GFR, Estimated: 23 mL/min — ABNORMAL LOW (ref >60)
Glucose, Bld: 302 mg/dL — ABNORMAL HIGH (ref 70–99)
Glucose, Bld: 153 mg/dL — ABNORMAL HIGH (ref 70–99)
Potassium: 3.2 mmol/L — ABNORMAL LOW (ref 3.5–5.1)
Potassium: 3.8 mmol/L (ref 3.5–5.1)
Sodium: 131 mmol/L — ABNORMAL LOW (ref 135–145)
Sodium: 134 mmol/L — ABNORMAL LOW (ref 135–145)

## 2022-05-03 LAB — ACETAMINOPHEN LEVEL
Acetaminophen (Tylenol), Serum: 34 ug/mL — ABNORMAL HIGH (ref 10–30)
Acetaminophen (Tylenol), Serum: 29 ug/mL (ref 10–30)
Acetaminophen (Tylenol), Serum: 24 ug/mL (ref 10–30)

## 2022-05-03 LAB — LACTIC ACID, PLASMA
Lactic Acid, Venous: >9 mmol/L (ref 0.5–1.9)
Lactic Acid, Venous: >9 mmol/L (ref 0.5–1.9)

## 2022-05-03 LAB — MITOCHONDRIAL ANTIBODIES: Mitochondrial M2 Ab, IgG: <20 Units (ref 0.0–20.0)

## 2022-05-03 LAB — MAGNESIUM
Magnesium: 1.9 mg/dL (ref 1.7–2.4)
Magnesium: 1.6 mg/dL — ABNORMAL LOW (ref 1.7–2.4)

## 2022-05-03 LAB — PHOSPHORUS
Phosphorus: 1.3 mg/dL — ABNORMAL LOW (ref 2.5–4.6)
Phosphorus: 1.6 mg/dL — ABNORMAL LOW (ref 2.5–4.6)

## 2022-05-03 LAB — D-DIMER, QUANTITATIVE: D-Dimer, Quant: >20 ug/mL-FEU — ABNORMAL HIGH (ref 0.00–0.50)

## 2022-05-03 LAB — LAMOTRIGINE LEVEL: Lamotrigine Lvl: 1.6 ug/mL — ABNORMAL LOW (ref 2.0–20.0)

## 2022-05-03 LAB — FIBRINOGEN: Fibrinogen: 171 mg/dL — ABNORMAL LOW (ref 210–475)

## 2022-05-03 LAB — RPR: RPR Ser Ql: NONREACTIVE

## 2022-05-03 LAB — PROCALCITONIN: Procalcitonin: 70.83 ng/mL

## 2022-05-03 LAB — ANTI-SMOOTH MUSCLE ANTIBODY, IGG: F-Actin IgG: 4 Units (ref 0–19)

## 2022-05-03 MED ORDER — POTASSIUM CHLORIDE 20 MEQ PO PACK
40.0000 meq | PACK | Freq: Once | ORAL | Status: AC
  Administered 2022-05-03: 40 meq
  Filled 2022-05-03: qty 2

## 2022-05-03 MED ORDER — VITAMIN K1 10 MG/ML IJ SOLN
10.0000 mg | Freq: Once | INTRAVENOUS | Status: AC
  Administered 2022-05-03: 10 mg via INTRAVENOUS
  Filled 2022-05-03: qty 1

## 2022-05-03 MED ORDER — VITAMIN K1 10 MG/ML IJ SOLN
1.0000 mg | Freq: Once | INTRAVENOUS | Status: DC
  Filled 2022-05-03: qty 0.1

## 2022-05-03 MED ORDER — INSULIN REGULAR(HUMAN) IN NACL 100-0.9 UT/100ML-% IV SOLN
INTRAVENOUS | Status: DC
  Administered 2022-05-03: 4.2 [IU]/h via INTRAVENOUS
  Filled 2022-05-03 (×2): qty 100

## 2022-05-03 MED ORDER — THIAMINE HCL 100 MG/ML IJ SOLN
500.0000 mg | Freq: Every day | INTRAVENOUS | Status: DC
  Filled 2022-05-03: qty 5

## 2022-05-03 MED ORDER — DEXTROSE 50 % IV SOLN: 0.0000 mL | INTRAVENOUS | Status: DC | PRN

## 2022-05-03 MED ORDER — MAGNESIUM SULFATE 2 GM/50ML IV SOLN
2.0000 g | Freq: Once | INTRAVENOUS | Status: AC
  Administered 2022-05-03: 2 g via INTRAVENOUS
  Filled 2022-05-03: qty 50

## 2022-05-03 MED ORDER — POTASSIUM & SODIUM PHOSPHATES 280-160-250 MG PO PACK
2.0000 | PACK | ORAL | Status: AC
  Administered 2022-05-03 (×4): 2
  Filled 2022-05-03 (×4): qty 2

## 2022-05-03 MED ORDER — POTASSIUM CHLORIDE 20 MEQ PO PACK: 40.0000 meq | PACK | ORAL | Status: DC

## 2022-05-03 MED ORDER — SODIUM CHLORIDE 0.9 % IV SOLN
2.0000 g | INTRAVENOUS | Status: DC
  Administered 2022-05-03: 2 g via INTRAVENOUS
  Filled 2022-05-03: qty 2

## 2022-05-03 MED ORDER — CALCIUM GLUCONATE-NACL 2-0.675 GM/100ML-% IV SOLN
2.0000 g | Freq: Once | INTRAVENOUS | Status: AC
  Administered 2022-05-03: 2000 mg via INTRAVENOUS
  Filled 2022-05-03: qty 100

## 2022-05-03 MED ORDER — THIAMINE HCL 100 MG/ML IJ SOLN: 100.0000 mg | INTRAMUSCULAR | Status: DC

## 2022-05-03 MED ORDER — INSULIN DETEMIR 100 UNIT/ML ~~LOC~~ SOLN
5.0000 [IU] | Freq: Two times a day (BID) | SUBCUTANEOUS | Status: DC
  Administered 2022-05-03: 5 [IU] via SUBCUTANEOUS
  Filled 2022-05-03 (×2): qty 0.05

## 2022-05-03 MED ORDER — VANCOMYCIN VARIABLE DOSE PER UNSTABLE RENAL FUNCTION (PHARMACIST DOSING): Status: DC

## 2022-05-03 MED ORDER — STERILE WATER FOR INJECTION IV SOLN
INTRAVENOUS | Status: DC
  Filled 2022-05-03 (×3): qty 150
  Filled 2022-05-03: qty 1000
  Filled 2022-05-03: qty 150

## 2022-05-03 MED ORDER — SODIUM CHLORIDE 0.9% IV SOLUTION: Freq: Once | INTRAVENOUS | Status: AC

## 2022-05-03 MED ORDER — POTASSIUM CHLORIDE 10 MEQ/100ML IV SOLN
10.0000 meq | INTRAVENOUS | Status: AC
  Administered 2022-05-03 (×4): 10 meq via INTRAVENOUS
  Filled 2022-05-03 (×4): qty 100

## 2022-05-03 MED ORDER — POTASSIUM & SODIUM PHOSPHATES 280-160-250 MG PO PACK
2.0000 | PACK | ORAL | Status: DC
  Administered 2022-05-04 (×2): 2
  Filled 2022-05-03 (×2): qty 2

## 2022-05-03 MED ORDER — INSULIN ASPART 100 UNIT/ML IJ SOLN: 2.0000 [IU] | INTRAMUSCULAR | Status: DC

## 2022-05-03 MED ORDER — POLYETHYLENE GLYCOL 3350 17 G PO PACK: 17.0000 g | PACK | Freq: Every day | ORAL | Status: DC | PRN

## 2022-05-03 NOTE — Progress Notes (Signed)
Initial Nutrition Assessment  DOCUMENTATION CODES:   Not applicable  INTERVENTION:   Once appropriate for tube feeds, recommend:  Vital 1.2@55ml /hr- Initiate at 58ml/hr and increase by 35ml/hr q 8 hours until goal rate is reached.   Free water flushes 67ml q4 hours to maintain tube patency   Regimen provides 1584kcal/day, 99g/day protein and 1242ml/day of free water.   Pt at high refeed risk; recommend monitor potassium, magnesium and phosphorus labs daily until stable  Daily weights   NUTRITION DIAGNOSIS:   Inadequate oral intake related to inability to eat (pt sedated and ventilated) as evidenced by NPO status.  GOAL:   Provide needs based on ASPEN/SCCM guidelines  MONITOR:   Vent status, Labs, Weight trends, TF tolerance, Skin, I & O's  REASON FOR ASSESSMENT:   Ventilator    ASSESSMENT:   62 y/o female with h/o depression, anxiety, substance abuse, chronic pain, PTSD, DDD, etoh abuse, bipolar disorder, seizures, GERD and brain tumor s/p resection who is admitted with hydronephrosis secondary to obstructing renal calculus, septic shock, bacteremia, tylenol overdose and GIB. Pt s/p left ureteral stent placement 1/7.  Pt sedated and ventilated. OGT in place. Will hold on tube feeds for today. Will plan to initiate tube feeds tomorrow if patient is metabolically stable. Pt is refeeding; electrolytes being supplemented. Per chart, pt's UBW appears to be 100-115lbs. Pt is currently up ~20lbs from her UBW. Pt +8.3L on her I & Os.   Medications reviewed and include: colace, folic acid, MVI, protonix, miralax, Kphos, thiamine, ceftriaxone, insulin, levophed, Na bicarbonate, vasopressin    Labs reviewed: Na 131(L), K 2.9(L), BUN 24(H), creat 2.01(H), Ca 6.1(L) adj. 7.86(L), P 1.3(L), Mg 1.9 wnl, alb 1.8(L), AST 9162(H), ALT 1708(H), tbili 3.5(H) Cbgs- 278, 324, 365, 426, 404, 415 x 24 hrs  Patient is currently intubated on ventilator support MV: 12 L/min Temp (24hrs),  Avg:98.1 F (36.7 C), Min:96.8 F (36 C), Max:100.4 F (38 C)  Propofol: none   MAP- 60-22mmHg   UOP- 728ml   NUTRITION - FOCUSED PHYSICAL EXAM:  Flowsheet Row Most Recent Value  Orbital Region No depletion  Upper Arm Region No depletion  Thoracic and Lumbar Region No depletion  Buccal Region No depletion  Temple Region No depletion  Clavicle Bone Region Mild depletion  Clavicle and Acromion Bone Region Mild depletion  Scapular Bone Region No depletion  Dorsal Hand No depletion  Patellar Region Mild depletion  Anterior Thigh Region Mild depletion  Posterior Calf Region Severe depletion  Edema (RD Assessment) Moderate  Hair Reviewed  Eyes Reviewed  Mouth Reviewed  Skin Reviewed  Nails Reviewed   Diet Order:   Diet Order             Diet NPO time specified  Diet effective now                  EDUCATION NEEDS:   No education needs have been identified at this time  Skin:  Skin Assessment: Reviewed RN Assessment (incision vagina)  Last BM:  pta  Height:   Ht Readings from Last 1 Encounters:  05/05/2022 5\' 4"  (1.626 m)    Weight:   Wt Readings from Last 1 Encounters:  04/28/2022 60.5 kg    Ideal Body Weight:  54.5 kg  BMI:  Body mass index is 22.89 kg/m.  Estimated Nutritional Needs:   Kcal:  1572kcal/day  Protein:  85-95g/day  Fluid:  1.7-1.9L/day  Koleen Distance MS, RD, LDN Please refer to [facility] for RD and/or  RD on-call/weekend/after hours pager

## 2022-05-03 NOTE — Progress Notes (Signed)
Patient ID: Ariel White, female   DOB: 1960-08-17, 62 y.o.   MRN: [id number]     [facility] Day(s): 1.   Interval History: Patient seen and examined.  Patient critically ill, sedated on mechanical ventilation.  Patient clinically deteriorating rapidly.  Today she was found with significant extremity cyanosis.  Also with worsening liver failure with platelet of 57, INR of 10.  AST over 10,000, ALT 2237.  Vital signs in last 24 hours: [min-max] current  Temp:  [96.8 F (36 C)-100.4 F (38 C)] 99 F (37.2 C) (01/08 1845) Pulse Rate:  [69-143] 107 (01/08 1845) Resp:  [23-30] 28 (01/08 1845) BP: (43-211)/(19-193) 137/59 (01/08 1845) SpO2:  [95 %-100 %] 98 % (01/08 1845) Arterial Line BP: (93-162)/(61-91) 154/62 (01/08 0614) FiO2 (%):  [28 %] 28 % (01/08 1430) Weight:  [60.5 kg] 60.5 kg (01/08 0500)     Height: 5\' 4"  (162.6 cm) Weight: 60.5 kg BMI (Calculated): 22.88   Physical Exam:  Constitutional: Critically ill, sedated Respiratory: On mechanical ventilation, bilateral rhonchi. Cardiovascular: Tachycardic Gastrointestinal: soft, non-distended  Labs:     Latest Ref Rng & Units 05/09/2022    4:35 AM 05/26/2022   10:32 PM 10/31/2020    7:11 AM  CBC  WBC 4.0 - 10.5 K/uL 9.7  11.7  5.3   Hemoglobin 12.0 - 15.0 g/dL 14.6  13.1  12.1   Hematocrit 36.0 - 46.0 % 42.4  40.2  35.3   Platelets 150 - 400 K/uL 57  102  167       Latest Ref Rng & Units 05/19/2022    1:09 PM 05/26/2022    4:35 AM 05/01/2022    1:06 AM  CMP  Glucose 70 - 99 mg/dL 302  406    BUN 6 - 20 mg/dL 24  24    Creatinine 0.44 - 1.00 mg/dL 2.29  2.01    Sodium 135 - 145 mmol/L 131  131    Potassium 3.5 - 5.1 mmol/L 3.2  2.9    Chloride 98 - 111 mmol/L 86  90    CO2 22 - 32 mmol/L 19  16    Calcium 8.9 - 10.3 mg/dL 6.1  6.1    Total Protein 6.5 - 8.1 g/dL 4.1  4.1  4.1   Total Bilirubin 0.3 - 1.2 mg/dL 3.5  3.5  3.3   Alkaline Phos 38 - 126 U/L 128  122  124   AST 15 - 41 U/L >10,000  9,162  7,017    ALT 0 - 44 U/L 2,237  1,708  1,351     Imaging studies: No new pertinent imaging studies   Assessment/Plan:  62 y.o. female critically ill, due to fulminant acute liver failure.  -Patient is rapidly deteriorating -Today labs shows AST >10,000, ALT of 2000, INR of 10, platelets decreased to 57 -Disease all consistent with worsening acute liver failure -Again, low suspicion is that the bite was etiology of her clinical status.  Thickening of the gallbladder most likely secondary to liver failure and edema. -I agree with ICU team and consultants that due to her rapidly deteriorating clinical status this is a very appropriate gnosis case.  No surgical intervention will be recommended.  General surgery will sign off.  Contact us if there is anything that I can assist.  Arnold Long, MD

## 2022-05-03 NOTE — Progress Notes (Signed)
Ariel Mola, NP aware of Critical Labs, INR:10.6, PT: 83.6, Lactic Acid: 9.0,

## 2022-05-03 NOTE — Progress Notes (Signed)
Patient noted to have purple discoloration /cyanosis in all four extremities. Right hand significantly more discolored. Unable to doppler right radial pulse. Darel Hong, NP notified and to bedside.

## 2022-05-03 NOTE — Consult Note (Addendum)
PHARMACY CONSULT NOTE - ELECTROLYTES  Pharmacy Consult for Electrolyte Monitoring and Replacement   Recent Labs: Potassium (mmol/L)  Date Value  04/28/2022 3.8  01/01/2014 3.6   Magnesium (mg/dL)  Date Value  05/14/2022 1.6 (L)   Calcium (mg/dL)  Date Value  05/14/2022 5.8 (LL)   Calcium, Total (mg/dL)  Date Value  01/01/2014 8.3 (L)   Albumin (g/dL)  Date Value  04/28/2022 1.8 (L)  01/01/2014 3.4   Phosphorus (mg/dL)  Date Value  05/05/2022 1.6 (L)   Sodium (mmol/L)  Date Value  05/07/2022 134 (L)  01/01/2014 138   Corrected Ca: 7.5 mg/dL  Assessment  Ariel White is a 62 y.o. female presenting with GIB, sepsis. PMH significant for anxiety, arthritis, bipolar 1 disorder, depression, fibromyalgia, GERD, migraines, seizures. Pharmacy has been consulted to monitor and replace electrolytes.  Diet: NPO MIVF: Bicarb in SWFI @ 200 mL/hr Pertinent medications: insulin infusion  Goal of Therapy: Electrolytes WNL  Plan:  Potassium: 4.1 >> 2.9 > 3.8, give KCl 40 mEq per tube x1 since still on insulin infusion Magnesium: 1.5 >> 1.9>>1.6, Magnesium 2 gram x 1  Phosphorus: 4.2 >> 1.3 >1.6, continue with additional PhosNAK 2 packets Q4H x4 Calcium: Corrected 7.5: IV calcium gluconate 2 gram x 1 Check BMP, Mg, Phos with AM labs  Thank you for allowing pharmacy to be a part of this patient's care.  Dorothe Pea, PharmD, BCPS Clinical Pharmacist    7:22 PM

## 2022-05-03 NOTE — Progress Notes (Addendum)
MEDICATION RELATED CONSULT NOTE - INITIAL   Pharmacy Consult for N-AC  Indication: APAP overdose (Chronic)   Allergies  Allergen Reactions   Compazine [Prochlorperazine Edisylate] Other (See Comments)    euphoria   Ivp Dye [Iodinated Contrast Media] Anaphylaxis    "stopped her heart"   Baclofen Other (See Comments)    dizziness   Imitrex [Sumatriptan] Swelling    "throat swelling"   Voltaren [Diclofenac] Other (See Comments)    Cannot hold onto objects (drops things)    Patient Measurements: Height: 5\' 4"  (162.6 cm) Weight: 55 kg (121 lb 4.1 oz) IBW/kg (Calculated) : 54.7 Adjusted Body Weight:   Vital Signs: Temp: 98.2 F (36.8 C) (01/08 0319) Temp Source: Esophageal (01/07 1800) BP: 203/193 (01/08 0319) Pulse Rate: 69 (01/08 0319) Intake/Output from previous day: 01/07 0701 - 01/08 0700 In: 3487.8 [I.V.:1124; IV Piggyback:2363.8] Out: 650 [Urine:650] Intake/Output from this shift: Total I/O In: -  Out: 150 [Urine:150]  Labs: Recent Labs    05/18/2022 2232 05/10/2022 0005 04/26/2022 0614 05/18/2022 1343 05/07/2022 0106  WBC 11.7*  --   --   --   --   HGB 13.1  --   --   --   --   HCT 40.2  --   --   --   --   PLT 102*  --   --   --   --   APTT  --  43*  --   --   --   CREATININE 1.85*  --  1.13*  --   --   MG 1.5*  --   --   --   --   PHOS  --   --   --  4.2  --   ALBUMIN 3.2*  --  2.2*  --  1.9*  PROT 6.4*  --  4.7*  --  4.1*  AST 1,205*  --  1,616*  --  7,017*  ALT 252*  --  315*  --  1,351*  ALKPHOS 114  --  98  --  124  BILITOT 1.6*  --  1.5*  --  3.3*  BILIDIR  --   --   --   --  2.1*  IBILI  --   --   --   --  1.2*    Estimated Creatinine Clearance: 45.7 mL/min (A) (by C-G formula based on SCr of 1.13 mg/dL (H)).   Microbiology: Recent Results (from the past 720 hour(s))  Resp panel by RT-PCR (RSV, Flu A&B, Covid) Anterior Nasal Swab     Status: None   Collection Time:  10:27 PM   Specimen: Anterior Nasal Swab  Result Value Ref Range  Status   SARS Coronavirus 2 by RT PCR NEGATIVE NEGATIVE Final    Comment: (NOTE) SARS-CoV-2 target nucleic acids are NOT DETECTED.  The SARS-CoV-2 RNA is generally detectable in upper respiratory specimens during the acute phase of infection. The lowest concentration of SARS-CoV-2 viral copies this assay can detect is 138 copies/mL. A negative result does not preclude SARS-Cov-2 infection and should not be used as the sole basis for treatment or other patient management decisions. A negative result may occur with  improper specimen collection/handling, submission of specimen other than nasopharyngeal swab, presence of viral mutation(s) within the areas targeted by this assay, and inadequate number of viral copies(<138 copies/mL). A negative result must be combined with clinical observations, patient history, and epidemiological information. The expected result is Negative.  Fact Sheet for Patients:  EntrepreneurPulse.com.au  Fact Sheet for Healthcare Providers:  IncredibleEmployment.be  This test is no t yet approved or cleared by the [place] FDA and  has been authorized for detection and/or diagnosis of SARS-CoV-2 by FDA under an Emergency Use Authorization (EUA). This EUA will remain  in effect (meaning this test can be used) for the duration of the COVID-19 declaration under Section 564(b)(1) of the Act, 21 U.S.C.section 360bbb-3(b)(1), unless the authorization is terminated  or revoked sooner.       Influenza A by PCR NEGATIVE NEGATIVE Final   Influenza B by PCR NEGATIVE NEGATIVE Final    Comment: (NOTE) The Xpert Xpress SARS-CoV-2/FLU/RSV plus assay is intended as an aid in the diagnosis of influenza from Nasopharyngeal swab specimens and should not be used as a sole basis for treatment. Nasal washings and aspirates are unacceptable for Xpert Xpress SARS-CoV-2/FLU/RSV testing.  Fact Sheet for  Patients: EntrepreneurPulse.com.au  Fact Sheet for Healthcare Providers: IncredibleEmployment.be  This test is not yet approved or cleared by the [place] FDA and has been authorized for detection and/or diagnosis of SARS-CoV-2 by FDA under an Emergency Use Authorization (EUA). This EUA will remain in effect (meaning this test can be used) for the duration of the COVID-19 declaration under Section 564(b)(1) of the Act, 21 U.S.C. section 360bbb-3(b)(1), unless the authorization is terminated or revoked.     Resp Syncytial Virus by PCR NEGATIVE NEGATIVE Final    Comment: (NOTE) Fact Sheet for Patients: EntrepreneurPulse.com.au  Fact Sheet for Healthcare Providers: IncredibleEmployment.be  This test is not yet approved or cleared by the [place] FDA and has been authorized for detection and/or diagnosis of SARS-CoV-2 by FDA under an Emergency Use Authorization (EUA). This EUA will remain in effect (meaning this test can be used) for the duration of the COVID-19 declaration under Section 564(b)(1) of the Act, 21 U.S.C. section 360bbb-3(b)(1), unless the authorization is terminated or revoked.  Performed at [facility], [address] [place], [place] [address]   Blood Culture (routine x 2)     Status: None (Preliminary result)   Collection Time: 05/05/2022 10:27 PM   Specimen: BLOOD  Result Value Ref Range Status   Specimen Description BLOOD BLOOD LEFT FOREARM  Final   Special Requests   Final    BOTTLES DRAWN AEROBIC AND ANAEROBIC Blood Culture results may not be optimal due to an inadequate volume of blood received in culture bottles   Culture  Setup Time   Final    GRAM NEGATIVE RODS IN BOTH AEROBIC AND ANAEROBIC BOTTLES CRITICAL VALUE NOTED.  VALUE IS CONSISTENT WITH PREVIOUSLY REPORTED AND CALLED VALUE. Performed at [facility], [address] [place], [place]  [address]    Culture GRAM NEGATIVE RODS  Final   Report Status PENDING  Incomplete  Blood Culture (routine x 2)     Status: None (Preliminary result)   Collection Time: 05/18/2022 10:27 PM   Specimen: BLOOD  Result Value Ref Range Status   Specimen Description BLOOD BLOOD RIGHT FOREARM  Final   Special Requests   Final    BOTTLES DRAWN AEROBIC AND ANAEROBIC Blood Culture adequate volume   Culture  Setup Time   Final    ANAEROBIC BOTTLE ONLY IN BOTH AEROBIC AND ANAEROBIC BOTTLES Organism ID to follow CRITICAL RESULT CALLED TO, READ BACK BY AND VERIFIED WITH: DVAN MITCHELL @1220  05/01/2022 MJU Performed at [facility], [address], [place], [place] [address]    Culture GRAM NEGATIVE RODS  Final  Report Status PENDING  Incomplete  Blood Culture ID Panel (Reflexed)     Status: Abnormal   Collection Time: 04/30/2022 10:27 PM  Result Value Ref Range Status   Enterococcus faecalis NOT DETECTED NOT DETECTED Final   Enterococcus Faecium NOT DETECTED NOT DETECTED Final   Listeria monocytogenes NOT DETECTED NOT DETECTED Final   Staphylococcus species NOT DETECTED NOT DETECTED Final   Staphylococcus aureus (BCID) NOT DETECTED NOT DETECTED Final   Staphylococcus epidermidis NOT DETECTED NOT DETECTED Final   Staphylococcus lugdunensis NOT DETECTED NOT DETECTED Final   Streptococcus species NOT DETECTED NOT DETECTED Final   Streptococcus agalactiae NOT DETECTED NOT DETECTED Final   Streptococcus pneumoniae NOT DETECTED NOT DETECTED Final   Streptococcus pyogenes NOT DETECTED NOT DETECTED Final   A.calcoaceticus-baumannii NOT DETECTED NOT DETECTED Final   Bacteroides fragilis NOT DETECTED NOT DETECTED Final   Enterobacterales DETECTED (A) NOT DETECTED Final    Comment: Enterobacterales represent a large order of gram negative bacteria, not a single organism. CRITICAL RESULT CALLED TO, READ BACK BY AND VERIFIED WITH: DAVAN MITCHELL @1220  05/18/2022 MJU    Enterobacter cloacae complex NOT  DETECTED NOT DETECTED Final   Escherichia coli DETECTED (A) NOT DETECTED Final    Comment: CRITICAL RESULT CALLED TO, READ BACK BY AND VERIFIED WITH: DVAN MITCHELL @1220  05/15/2022 MJU    Klebsiella aerogenes NOT DETECTED NOT DETECTED Final   Klebsiella oxytoca NOT DETECTED NOT DETECTED Final   Klebsiella pneumoniae NOT DETECTED NOT DETECTED Final   Proteus species NOT DETECTED NOT DETECTED Final   Salmonella species NOT DETECTED NOT DETECTED Final   Serratia marcescens NOT DETECTED NOT DETECTED Final   Haemophilus influenzae NOT DETECTED NOT DETECTED Final   Neisseria meningitidis NOT DETECTED NOT DETECTED Final   Pseudomonas aeruginosa NOT DETECTED NOT DETECTED Final   Stenotrophomonas maltophilia NOT DETECTED NOT DETECTED Final   Candida albicans NOT DETECTED NOT DETECTED Final   Candida auris NOT DETECTED NOT DETECTED Final   Candida glabrata NOT DETECTED NOT DETECTED Final   Candida krusei NOT DETECTED NOT DETECTED Final   Candida parapsilosis NOT DETECTED NOT DETECTED Final   Candida tropicalis NOT DETECTED NOT DETECTED Final   Cryptococcus neoformans/gattii NOT DETECTED NOT DETECTED Final   CTX-M ESBL NOT DETECTED NOT DETECTED Final   Carbapenem resistance IMP NOT DETECTED NOT DETECTED Final   Carbapenem resistance KPC NOT DETECTED NOT DETECTED Final   Carbapenem resistance NDM NOT DETECTED NOT DETECTED Final   Carbapenem resist OXA 48 LIKE NOT DETECTED NOT DETECTED Final   Carbapenem resistance VIM NOT DETECTED NOT DETECTED Final    Comment: Performed at [facility], [address] [place], [address] [place]  MRSA Next Gen by PCR, Nasal     Status: None   Collection Time: 05/11/2022  8:40 AM   Specimen: Nasal Mucosa; Nasal Swab  Result Value Ref Range Status   MRSA by PCR Next Gen NOT DETECTED NOT DETECTED Final    Comment: (NOTE) The GeneXpert MRSA Assay (FDA approved for NASAL specimens only), is one component of a comprehensive MRSA colonization  surveillance program. It is not intended to diagnose MRSA infection nor to guide or monitor treatment for MRSA infections. Test performance is not FDA approved in patients less than 22 years old. Performed at [facility], [address], [place], [address] [place]     Medical History: Past Medical History:  Diagnosis Date   Anxiety    Arthritis    Bipolar 1 disorder (HCC)  Chronic pain    Depression    Eczema    Fibromyalgia    GERD (gastroesophageal reflux disease)    Headache    migraines   History of hiatal hernia    Neuromuscular disorder (HCC)    Seizures (HCC)     Medications:  Medications Prior to Admission  Medication Sig Dispense Refill Last Dose   atorvastatin (LIPITOR) 20 MG tablet Take 20 mg by mouth daily.      diphenhydramine-acetaminophen (TYLENOL PM) 25-500 MG TABS tablet Take 2 tablets by mouth at bedtime as needed (sleep).       folic acid (FOLVITE) 1 MG tablet Take 1 tablet (1 mg total) by mouth daily. (Patient not taking: Reported on 05/15/2022)   Not Taking   lamoTRIgine (LAMICTAL) 100 MG tablet Take 100 mg by mouth 2 (two) times daily.      Multiple Vitamin (MULTIVITAMIN WITH MINERALS) TABS tablet Take 1 tablet by mouth daily. (Patient not taking: Reported on 05/23/2022)   Not Taking   OLANZapine (ZYPREXA) 2.5 MG tablet Take 1 tablet (2.5 mg total) by mouth 2 (two) times daily. (Patient taking differently: Take 2.5 mg by mouth every evening.) 60 tablet 0    pantoprazole (PROTONIX) 20 MG tablet Take 20 mg by mouth.      propranolol (INDERAL) 40 MG tablet Take 40 mg by mouth 2 (two) times daily.      senna-docusate (SENOKOT-S) 8.6-50 MG tablet Take 1 tablet by mouth at bedtime as needed for mild constipation. (Patient not taking: Reported on 05/09/2022)   Not Taking   thiamine 100 MG tablet Take 1 tablet (100 mg total) by mouth daily. (Patient not taking: Reported on 05/08/2022)   Not Taking   traZODone (DESYREL) 150 MG tablet Take 150 mg by mouth at  bedtime.       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  Pt reportedly has been taking 3 - 4 650 mg APAP TID along with tylenol PM and trazodone QHS for unspecified period of time.     1/6 @ 2232 = AST = 1205                        ALT = 252                       SrCr = 1.85                       K = 3.1   1/7 @ 0005 =  APAP = 93                        Salicylate = < 7.0                         INR = 1.9   Goal of Therapy:  APAP :  10 - 30  LFT's : WNL  (or trending down) INR : < 2   Plan:  [place] [facility] contacted who recommended begin  N - AC.   N-AC 8160 mg (150 mg/kg) IV X 1 followed N-AC (15 mg/kg) IV continuous infusion over 23 hrs.   Fomepizole 15 mg/kg (820 mg) IV X 1 ordered.   Will recheck INR, LFT's and APAP level 22 hrs after start of N-AC.  May 10, 2022 @ 0106:  AST = 7017,  ALT = 1351, APAP = 34 APAP level is lower than before but LFT's continue to trend up. Poison control recommends continuing N-AC and repeating labs in 12 hrs.  - Can give a dose of Vit K 10 mg IV X 1 if INR is elevated  - Will recheck INR , APAP and LFT's on 05-10-2022 @ 1300.   2022-05-10: INR @ 0348 = > 10 Will order Vit K 10 mg IV X 1 per recommendation from [facility] suggests monitoring BMP for Acidosis, contact GI specialist if acidotic   Jamiaya Bina D 2022-05-10,4:24 AM

## 2022-05-03 NOTE — Progress Notes (Signed)
NAME:  Ariel White, MRN:  [id number], DOB:  10-20-1960, LOS: 1 ADMISSION DATE:  05/25/2022, CONSULTATION DATE: May 18, 2022 REFERRING MD: Dr. Fuller Plan, CHIEF COMPLAINT: Shortness of Breath   History of Present Illness:  This is a 62 yo female who presented to [facility] ER on May 18, 2022 via EMS with c/o shortness of breath, abdominal pain, generalized weakness, and dark tarry stools onset 3 days prior to presentation.  She denies taking blood thinners, however she does endorse taking frequent doses of tylenol. Pts sister reported the pt has also had alcohol use as well.  The pt reported to EMS she had not eaten in several days.  En route to the ER pt became hypotensive bp 55/36/heart rate 114/RA 98%, code sepsis activated.    ED Course  Upon arrival to the ER pt remained profoundly hypotensive, hypothermic, and tachycardiac.  Pt placed on 6L O2 via nasal canula.  She received 2L of NS and 2 units of emergent pRBC's.  She also received zofran/protonix/metronidazole/vancomycin. Significant labs were Na+ 128/K+ 3.1/chloride 92/CO2 13/BUN 32/creatinine 1.85/anion gap 23/magnesium 1.5/AST 1,205/ALT 252/lactic acid >9.0/wbc 11.7/platelets 102/tylenol level 93/salicylate <7.0. Due to concern of unintentional tylenol overdose poison control contacted and recommended acetylcysteine load followed by continuous gtt and 820 mg iv antizol x1 dose.  CT Abd/Pelvis concerning for mild left hydronephrosis/perinephric stranding probable obstructing 4 mm calculus within the proximal left ureter/mild hepatic steatosis/gallbladder wall thickening and soft tissue infiltration within the porta hepatis.  Urology consulted by ER provider and at that time held off on stent placement with recommendation to stabilize the pt with plans for stent placement in the am.  However, pt later decompensated and stent placement put on hold.  Due to concern of possible choledocholithiasis MRCP and Abdominal US ordered.  Results were negative for choledocholithiasis  (see detailed report below).  General Surgery consulted with recommendations to treat tylenol overdose with low suspicion for cholecystitis.  While in the ER pt became hypotensive requiring levophed gtt.  PCCM team contacted for ICU admission.     CXR: No active disease.  CT Head/Cervical Spine:  No acute intracranial abnormalities. Chronic atrophy and small vessel ischemic changes. Postoperative changes in the skull base. Alignment of the cervical spine is unchanged since prior study consistent with degenerative change. No acute displaced fractures identified.  CT Elbow Right: Acute supracondylar fracture involving the distal humerus is identified. The fracture fragments are in near anatomic alignment. Remote distal diaphyseal fracture of the distal humerus.  CT Abd/Pelvis:  Mild left hydronephrosis and perinephric stranding probable obstructing 4 mm calculus within the proximal left ureter. Mild surrounding inflammatory stranding suggests periureteric stranding related to urothelial inflammation. Correlation with urinalysis and urine culture may be helpful. Superimposed mild bilateral nonobstructing nephrolithiasis. Mild hepatic steatosis. Gallbladder wall thickening and soft tissue infiltration within the porta hepatis. This may reflect changes of a inflammatory process involving the biliary tree, head of the pancreas, or second portion of the duodenum. Correlation with liver enzymes and serum amylase and lipase levels may be helpful for further evaluation. Aortic atherosclerosis. (ICD10-I70.0).  MRCP Abd:  Marked edema and wall thickening of the gallbladder wall with a nondistended lumen. No evidence for gallstones an no discrete enhancing mass lesion is discernible within the gallbladder although the mucosa is diffusely hyperenhancing after contrast administration. Gallbladder wall thickening/edema is considered nonspecific in this study given no discernible gallstones and can be seen in the  setting of infection/inflammation, volume overload, liver failure, and heart failure. No substantial intrahepatic or extrahepatic  biliary duct dilatation. No evidence of choledocholithiasis. Pancreas appears to have some edema in the parenchyma with associated edema in the retroperitoneal tissues. Imaging of the pancreas is nondiagnostic on pre and postcontrast T1 imaging due to motion artifact. Fairly extensive edema in the gallbladder fossa, hepatoduodenal ligament, retroperitoneal tissues. Jejunal small bowel loops in the upper abdomen appear edematous. Small volume free fluid around the liver and spleen with retroperitoneal edema/fluid noted bilaterally. Hepatomegaly with fatty deposition. Small bilateral effusions.  Pertinent  Medical History  Anxiety  Arthritis  Bipolar 1 Disorder  Chronic Pain  Depression  Eczema  Fibromyalgia  GERD  Migraines  Hiatal Hernia  Neuromuscular Disorder  Seizures   Significant Hospital Events: Including procedures, antibiotic start and stop dates in addition to other pertinent events   01/7: Pt admitted to ICU with anion gap metabolic acidosis, acute respiratory failure, and septic shock along with 4 mm obstructing ureteral calculus; liver failure secondary to unintentional tylenol overdose along with alcohol use and possible acute cholecystitis requiring mechanical intubation and vasopressor support BLOOD CX + E coli/ENTEROBACTER SPECIES 01/7: Attempted to transfer pt to [facility], however [facility] declined transfer multiorgan failure May 14, 2022 multiorgan failure  Interim History / Subjective:  Patient remains on vent Severe liver failure, resp failure and shock  Vent Mode: PRVC FiO2 (%):  [28 %-60 %] 28 % Set Rate:  [24 bmp-28 bmp] 28 bmp Vt Set:  [400 mL-450 mL] 450 mL PEEP:  [5 cmH20] 5 cmH20   Objective   Blood pressure (!) 109/40, pulse 100, temperature (!) 97 F (36.1 C), temperature source Esophageal, resp. rate (!) 28, height 5\' 4"   (1.626 m), weight 60.5 kg, SpO2 100 %.    Vent Mode: PRVC FiO2 (%):  [28 %-60 %] 28 % Set Rate:  [24 bmp-28 bmp] 28 bmp Vt Set:  [400 mL-450 mL] 450 mL PEEP:  [5 cmH20] 5 cmH20   Intake/Output Summary (Last 24 hours) at 05/14/2022 0723 Last data filed at 14-May-2022 07/02/2022 Gross per 24 hour  Intake 6213.86 ml  Output 750 ml  Net 5463.86 ml   Filed Weights   05/23/2022 2221 05/15/2022 0857 2022/05/14 0500  Weight: 54.4 kg 55 kg 60.5 kg   Antibiotics Given (last 72 hours)     Date/Time Action Medication Dose Rate   04/26/2022 2329 New Bag/Given   ceFEPIme (MAXIPIME) 2 g in sodium chloride 0.9 % 100 mL IVPB 2 g 200 mL/hr   04/30/2022 2336 New Bag/Given   metroNIDAZOLE (FLAGYL) IVPB 500 mg 500 mg 100 mL/hr   05/21/2022 1111 New Bag/Given   piperacillin-tazobactam (ZOSYN) IVPB 3.375 g 3.375 g 12.5 mL/hr   05/21/2022 1134 New Bag/Given   vancomycin (VANCOREADY) IVPB 1250 mg/250 mL 1,250 mg 166.7 mL/hr   04/26/2022 1622 New Bag/Given   piperacillin-tazobactam (ZOSYN) IVPB 3.375 g 3.375 g 12.5 mL/hr   05/14/2022 2210 New Bag/Given   piperacillin-tazobactam (ZOSYN) IVPB 3.375 g 3.375 g 12.5 mL/hr   05-14-22 0530 New Bag/Given   piperacillin-tazobactam (ZOSYN) IVPB 3.375 g 3.375 g 12.5 mL/hr        REVIEW OF SYSTEMS  PATIENT IS UNABLE TO PROVIDE COMPLETE REVIEW OF SYSTEMS DUE TO SEVERE CRITICAL ILLNESS   PHYSICAL EXAMINATION:  GENERAL:critically ill appearing, +resp distress EYES: Pupils equal, round, reactive to light.  No scleral icterus.  MOUTH: Moist mucosal membrane. INTUBATED NECK: Supple.  PULMONARY: Lungs clear to auscultation, +rhonchi, +wheezing CARDIOVASCULAR: S1 and S2.  Regular rate and rhythm GASTROINTESTINAL: Soft, nontender, -distended. Positive bowel sounds.  MUSCULOSKELETAL: No swelling, clubbing, or edema.  NEUROLOGIC: obtunded,sedated SKIN:normal, warm to touch, Capillary refill delayed  Pulses present bilaterally   Assessment & Plan:  62 yo white female with severe  Acute respiratory failure in the setting of anion gap metabolic acidosis due to septic shock with e coli bacteremia due to infected kidney stone complicated by acute and severe metabolic encephalopathy with liver and renal failure in setting of ETOH abuse  Severe ACUTE Hypoxic and Hypercapnic Respiratory Failure -continue Mechanical Ventilator support -Wean Fio2 and PEEP as tolerated -VAP/VENT bundle implementation - Wean PEEP & FiO2 as tolerated, maintain SpO2 > 88% - Head of bed elevated 30 degrees, VAP protocol in place - Plateau pressures less than 30 cm H20  - Intermittent chest x-ray & ABG PRN - Ensure adequate pulmonary hygiene  -will NOT  perform SAT/SBT   SEPTIC shock SOURCE-UTI KIDNEY STONE acalculous cholecystitis and 4 mm obstructing left ureteral calculus  -use vasopressors to keep MAP>65 as needed -consider stress dose steroids -aggressive IV fluid Resuscitation  ACUTE KIDNEY INJURY/Renal Failure -continue Foley Catheter-assess need -Avoid nephrotoxic agents -Follow urine output, BMP -Ensure adequate renal perfusion, optimize oxygenation -Renal dose medications Hypomagnesemia-replace as needed  Intake/Output Summary (Last 24 hours) at 05-13-2022 3976 Last data filed at 13-May-2022 7341 Gross per 24 hour  Intake 6213.86 ml  Output 750 ml  Net 5463.86 ml   LIVER FAILURE Acute liver failure secondary to unintentional tylenol overdose and ETOH abuse  [facility] will NOT accept for liver transplant   Thrombocytopenia and elevated PT/INR in the setting of acute liver failure  Melena  - Trend CBC  - Monitor for s/sx of bleeding and transfuse for hgb <7 - VTE px: SCD's; avoid chemical px for now    NEUROLOGY ACUTE METABOLIC ENCEPHALOPATHY Hx: Seizure disorder, bipolar 1 disorder, anxiety, depression, ETOH use, and chronic pain  - Correct metabolic derangements  - Hold outpatient olanzapine, trazodone, and lamictal for now  - Thiamine, folic acid, and MVI  - WUA daily  once parameters met  - Seizure precautions  - Monitor for ETOH withdrawal    PATIENT WITH MULTIORGAN FAILURE AND HIGH RISK FOR DEATH PATIENT IS IN THE DYING PROCESS Best Practice (right click and "Reselect all SmartList Selections" daily)   Diet/type: NPO DVT prophylaxis: SCD GI prophylaxis: PPI Lines: Central line Foley:  Yes, and it is still needed Code Status:  full code Last date of multidisciplinary goals of care discussion [05/02/21]  05/24/2022: Pts sister Leane Para updated at bedside regarding pt condition and current plan of care.  Labs   CBC: Recent Labs  Lab 05/08/2022 2232 13-May-2022 0435  WBC 11.7* 9.7  NEUTROABS 9.4*  --   HGB 13.1 14.6  HCT 40.2 42.4  MCV 116.2* 101.7*  PLT 102* 57*     Basic Metabolic Panel: Recent Labs  Lab 05/05/2022 2232 05/25/2022 0614 05/19/2022 1343 05/13/22 0435  NA 128* 134*  --  131*  K 3.1* 4.1  --  2.9*  CL 92* 105  --  90*  CO2 13* <7*  --  16*  GLUCOSE 85 63*  --  406*  BUN 32* 23*  --  24*  CREATININE 1.85* 1.13*  --  2.01*  CALCIUM 8.2* 6.6*  --  6.1*  MG 1.5*  --   --  1.9  PHOS  --   --  4.2 1.3*    GFR: Estimated Creatinine Clearance: 25.7 mL/min (A) (by C-G formula based on SCr of 2.01 mg/dL (H)).  Recent Labs  Lab 05/21/2022 2232 05/18/2022 0005 05/07/2022 0611 05/26/2022 1121 04/28/2022 0435  PROCALCITON  --   --   --  28.40  --   WBC 11.7*  --   --   --  9.7  LATICACIDVEN >9.0* >9.0* >9.0*  --  >9.0*     Liver Function Tests: Recent Labs  Lab 05/16/2022 2232 05/20/2022 0614 05/14/2022 0106 05/18/2022 0435  AST 1,205* 1,616* 7,017* 9,162*  ALT 252* 315* 1,351* 1,708*  ALKPHOS 114 98 124 122  BILITOT 1.6* 1.5* 3.3* 3.5*  PROT 6.4* 4.7* 4.1* 4.1*  ALBUMIN 3.2* 2.2* 1.9* 1.8*    Recent Labs  Lab 05/18/2022 0005 05/07/2022 0714  LIPASE 25  --   AMYLASE  --  29    Recent Labs  Lab 05/06/2022 1558  AMMONIA 34    ABG    Component Value Date/Time   PHART 7.35 05/20/2022 0307   PCO2ART 22 (L) 05/11/2022  0307   PO2ART 94 05/02/2022 0307   HCO3 12.1 (L) 05/26/2022 0307   ACIDBASEDEF 11.4 (H) 05/11/2022 0307   O2SAT 98.9 05/18/2022 0307     Coagulation Profile: Recent Labs  Lab 05/01/2022 0005 05/10/2022 0348  0435  INR 1.9* >10.0* 10.7*     Cardiac Enzymes: Recent Labs  Lab 05/21/2022 0714  CKTOTAL 45    HbA1C: No results found for: "HGBA1C"  CBG: Recent Labs  Lab  1553 04/28/2022 1918 05/10/2022 2051 05/15/2022 2321  0520  GLUCAP 186* 182* 147* 237* 318*      DVT/GI PRX  assessed I Assessed the need for Labs I Assessed the need for Foley I Assessed the need for Central Venous Line Family Discussion when available I Assessed the need for Mobilization I made an Assessment of medications to be adjusted accordingly Safety Risk assessment completed  CASE DISCUSSED IN MULTIDISCIPLINARY ROUNDS WITH ICU TEAM     Critical Care Time devoted to patient care services described in this note is 62 minutes.  Critical care was necessary to treat /prevent imminent and life-threatening deterioration. Overall, patient is critically ill, prognosis is guarded.  Patient with Multiorgan failure and at high risk for cardiac arrest and death.    Lucie Leather, M.D.  [facility]  Medical Director [facility] Medical Director [facility] Cardio-Pulmonary Department

## 2022-05-03 NOTE — Consult Note (Signed)
PHARMACY CONSULT NOTE - ELECTROLYTES  Pharmacy Consult for Electrolyte Monitoring and Replacement   Recent Labs: Potassium (mmol/L)  Date Value  05/12/2022 2.9 (L)  01/01/2014 3.6   Magnesium (mg/dL)  Date Value  05/07/2022 1.9   Calcium (mg/dL)  Date Value  05/18/2022 6.1 (LL)   Calcium, Total (mg/dL)  Date Value  01/01/2014 8.3 (L)   Albumin (g/dL)  Date Value  05/25/2022 1.8 (L)  01/01/2014 3.4   Phosphorus (mg/dL)  Date Value  05/20/2022 1.3 (L)   Sodium (mmol/L)  Date Value  04/30/2022 131 (L)  01/01/2014 138   Corrected Ca: 7.9 mg/dL  Assessment  Ariel White is a 62 y.o. female presenting with GIB, sepsis. PMH significant for anxiety, arthritis, bipolar 1 disorder, depression, fibromyalgia, GERD, migraines, seizures. Pharmacy has been consulted to monitor and replace electrolytes.  Diet: NPO MIVF: Bicarb in D5 @ 200 mL/hr Pertinent medications: insulin infusion  Goal of Therapy: Electrolytes WNL  Plan:  Potassium: 4.1 >> 2.9, give KCl 40 mEq per tube x1, KCl 10 mEq IV x4  Magnesium: 1.5 >> 1.9, no replacement needed Phosphorus: 4.2 >> 1.3, give PhosNAK 2 packets Q4H x4 Check Phos today at 1400 Check BMP, Mg, Phos with AM labs  Thank you for allowing pharmacy to be a part of this patient's care.  Gretel Acre, PharmD PGY1 Pharmacy Resident 05/21/2022 8:33 AM

## 2022-05-03 NOTE — IPAL (Signed)
  Interdisciplinary Goals of Care Family Meeting   Date carried out: 05/11/2022  Location of the meeting: Bedside  Member's involved: Physician, Bedside Registered Nurse, and Family Member or next of kin    GOALS OF CARE DISCUSSION  The Clinical status was relayed to family in detail-Sister Ariel White is estranged from patient  Updated and notified of patients medical condition- Patient remains unresponsive and will not open eyes to command.   Patient is having a weak cough and struggling to remove secretions.   Patient with increased WOB and using accessory muscles to breathe Explained to family course of therapy and the modalities  Severe CYANOSIS  Patient with Progressive multiorgan failure with a very high probablity of a very minimal chance of meaningful recovery despite all aggressive and optimal medical therapy.   Ariel White has consented and agreed to DNR status  Family are satisfied with Plan of action and management. All questions answered  Additional CC time 32 mins   Ariel White [name], M.D.  [name]  Medical Director [place] Director [facility] Cardio-Pulmonary Department

## 2022-05-03 NOTE — Progress Notes (Signed)
[facility] Gastroenterology Inpatient Progress Note    Subjective: Patient seen for f/u hepatic failure secondary to presumed unintentional tylenol overdose as a results of a therapeutic misadventure . Patient markedly obtunded, on ventilator. Coags markedly out (INR > 10).  Objective: Vital signs in last 24 hours: Temp:  [93.4 F (34.1 C)-100.4 F (38 C)] 97 F (36.1 C) (01/08 0723) Pulse Rate:  [69-143] 98 (01/08 0723) Resp:  [15-28] 28 (01/08 0723) BP: (43-211)/(13-193) 125/34 (01/08 0723) SpO2:  [91 %-100 %] 100 % (01/08 0723) Arterial Line BP: (93-162)/(61-92) 154/62 (01/08 0614) FiO2 (%):  [28 %-60 %] 28 % (01/08 0400) Weight:  [55 kg-60.5 kg] 60.5 kg (01/08 0500) Blood pressure (!) 125/34, pulse 98, temperature (!) 97 F (36.1 C), resp. rate (!) 28, height  (1.626 m), weight 60.5 kg, SpO2 100 %.    Intake/Output from previous day: 05-06-22 0701 - 01/08 0700 In: 6213.9 [I.V.:3681.2; Blood:78.8; IV Piggyback:2454] Out: 750 [Urine:750]  Intake/Output this shift: No intake/output data recorded.   Gen: Obtunded on ventilatory support.  HEENT: /AT. PERRLA. Normal external ear exam.  Chest: CTA, coarse rhonchi noted bilaterally.  CV: Tachy, nl S1, S2. No gallops.  Abd: soft, nt, nd. BS+  Ext: 1+ edema. Pulses 2+. Jaundice is apparent.  Neuro: Alert and oriented. Judgement appears normal. Nonfocal.   Lab Results: Results for orders placed or performed during the hospital encounter of 05/19/2022 (from the past 24 hour(s))  MRSA Next Gen by PCR, Nasal     Status: None   Collection Time: May 06, 2022  8:40 AM   Specimen: Nasal Mucosa; Nasal Swab  Result Value Ref Range   MRSA by PCR Next Gen NOT DETECTED NOT DETECTED  Glucose, capillary     Status: None   Collection Time: 06-May-2022  8:53 AM  Result Value Ref Range   Glucose-Capillary 81 70 - 99 mg/dL  Blood gas, arterial     Status: Abnormal   Collection Time: May 06, 2022 10:09 AM  Result Value Ref Range    FIO2 60 %   Delivery systems VENTILATOR    RATE 24 resp/min   PEEP 5 cm H20   pH, Arterial 6.96 (LL) 7.35 - 7.45   pCO2 arterial 21 (L) 32 - 48 mmHg   pO2, Arterial 235 (H) 83 - 108 mmHg   Bicarbonate 4.8 (L) 20.0 - 28.0 mmol/L   Acid-base deficit 26.5 (H) 0.0 - 2.0 mmol/L   O2 Saturation 100 %   Patient temperature 34.7    Collection site REVIEWED BY    Allens test (pass/fail) YEAST (A) PASS   Spontaneous VT 400 mL  Procalcitonin - Baseline     Status: None   Collection Time: 05/06/2022 11:21 AM  Result Value Ref Range   Procalcitonin 28.40 ng/mL  Troponin I (High Sensitivity)     Status: Abnormal   Collection Time: May 06, 2022 11:21 AM  Result Value Ref Range   Troponin I (High Sensitivity) 73 (H) <18 ng/L  Ethanol     Status: None   Collection Time: May 06, 2022 11:26 AM  Result Value Ref Range   Alcohol, Ethyl (B) <10 <10 mg/dL  Glucose, capillary     Status: Abnormal   Collection Time: 2022/05/06 11:58 AM  Result Value Ref Range   Glucose-Capillary 62 (L) 70 - 99 mg/dL  Glucose, capillary     Status: Abnormal   Collection Time: 05-06-2022 12:22 PM  Result Value Ref Range   Glucose-Capillary 200 (H) 70 - 99 mg/dL  Troponin I (High  Sensitivity)     Status: Abnormal   Collection Time: 05/26/2022  1:43 PM  Result Value Ref Range   Troponin I (High Sensitivity) 111 (HH) <18 ng/L  Blood gas, venous     Status: Abnormal   Collection Time: 04/29/2022  1:43 PM  Result Value Ref Range   pH, Ven 7.06 (LL) 7.25 - 7.43   pCO2, Ven 28 (L) 44 - 60 mmHg   pO2, Ven 77 (H) 32 - 45 mmHg   Bicarbonate 7.9 (L) 20.0 - 28.0 mmol/L   Acid-base deficit 21.2 (H) 0.0 - 2.0 mmol/L   O2 Saturation 94.8 %   Patient temperature 37.0    Collection site VEIN   Hepatitis panel, acute     Status: None   Collection Time: 05/16/2022  1:43 PM  Result Value Ref Range   Hepatitis B Surface Ag NON REACTIVE NON REACTIVE   HCV Ab NON REACTIVE NON REACTIVE   Hep A IgM NON REACTIVE NON REACTIVE   Hep B C IgM NON  REACTIVE NON REACTIVE  Phosphorus     Status: None   Collection Time: 04/28/2022  1:43 PM  Result Value Ref Range   Phosphorus 4.2 2.5 - 4.6 mg/dL  Glucose, capillary     Status: Abnormal   Collection Time: 05/08/2022  3:53 PM  Result Value Ref Range   Glucose-Capillary 186 (H) 70 - 99 mg/dL  Ammonia     Status: None   Collection Time: 05/14/2022  3:58 PM  Result Value Ref Range   Ammonia 34 9 - 35 umol/L  Troponin I (High Sensitivity)     Status: Abnormal   Collection Time: 04/29/2022  3:58 PM  Result Value Ref Range   Troponin I (High Sensitivity) 120 (HH) <18 ng/L  Blood gas, arterial     Status: Abnormal   Collection Time: 05/09/2022  5:52 PM  Result Value Ref Range   FIO2 35 %   Delivery systems VENTILATOR    MECHVT 450 mL   PEEP 5 cm H20   pH, Arterial 7.17 (LL) 7.35 - 7.45   pCO2 arterial 22 (L) 32 - 48 mmHg   pO2, Arterial 143 (H) 83 - 108 mmHg   Bicarbonate 7.8 (L) 20.0 - 28.0 mmol/L   Acid-base deficit 19.0 (H) 0.0 - 2.0 mmol/L   O2 Saturation 99.7 %   Patient temperature 36.5    Collection site A-LINE    Mechanical Rate 26   Troponin I (High Sensitivity)     Status: Abnormal   Collection Time: 05/12/2022  5:56 PM  Result Value Ref Range   Troponin I (High Sensitivity) 128 (HH) <18 ng/L  Glucose, capillary     Status: Abnormal   Collection Time: 05/13/2022  7:18 PM  Result Value Ref Range   Glucose-Capillary 182 (H) 70 - 99 mg/dL  Glucose, capillary     Status: Abnormal   Collection Time: 05/21/2022  8:51 PM  Result Value Ref Range   Glucose-Capillary 147 (H) 70 - 99 mg/dL  Blood gas, arterial     Status: Abnormal (Preliminary result)   Collection Time: 05/11/2022 10:00 PM  Result Value Ref Range   FIO2 28 %   Delivery systems VENTILATOR    Mode PRESSURE REGULATED VOLUME CONTROL    MECHVT 450 mL   PEEP 5 cm H20   pH, Arterial 7.27 (L) 7.35 - 7.45   pCO2 arterial 21 (L) 32 - 48 mmHg   pO2, Arterial 104 83 - 108 mmHg  Bicarbonate 9.6 (L) 20.0 - 28.0 mmol/L   Acid-base  deficit 15.2 (H) 0.0 - 2.0 mmol/L   O2 Saturation 99.2 %   Patient temperature 37.0    Collection site PENDING    Allens test (pass/fail) PASS PASS   Mechanical Rate 28   Troponin I (High Sensitivity)     Status: Abnormal   Collection Time: 05/26/2022 10:08 PM  Result Value Ref Range   Troponin I (High Sensitivity) 148 (HH) <18 ng/L  Glucose, capillary     Status: Abnormal   Collection Time: 05/11/2022 11:21 PM  Result Value Ref Range   Glucose-Capillary 237 (H) 70 - 99 mg/dL  Hepatic function panel     Status: Abnormal   Collection Time: 05/21/2022  1:06 AM  Result Value Ref Range   Total Protein 4.1 (L) 6.5 - 8.1 g/dL   Albumin 1.9 (L) 3.5 - 5.0 g/dL   AST 4,5407,017 (H) 15 - 41 U/L   ALT 1,351 (H) 0 - 44 U/L   Alkaline Phosphatase 124 38 - 126 U/L   Total Bilirubin 3.3 (H) 0.3 - 1.2 mg/dL   Bilirubin, Direct 2.1 (H) 0.0 - 0.2 mg/dL   Indirect Bilirubin 1.2 (H) 0.3 - 0.9 mg/dL  Acetaminophen level     Status: Abnormal   Collection Time: 05/11/2022  1:06 AM  Result Value Ref Range   Acetaminophen (Tylenol), Serum 34 (H) 10 - 30 ug/mL  Blood gas, arterial     Status: Abnormal   Collection Time: 05/15/2022  3:07 AM  Result Value Ref Range   FIO2 28 %   MECHVT 450 mL   PEEP 5 cm H20   pH, Arterial 7.35 7.35 - 7.45   pCO2 arterial 22 (L) 32 - 48 mmHg   pO2, Arterial 94 83 - 108 mmHg   Bicarbonate 12.1 (L) 20.0 - 28.0 mmol/L   Acid-base deficit 11.4 (H) 0.0 - 2.0 mmol/L   O2 Saturation 98.9 %   Patient temperature 37.0    Collection site A-LINE    Allens test (pass/fail) PASS PASS   Mechanical Rate 28   Prepare cryoprecipitate     Status: None (Preliminary result)   Collection Time: 05/24/2022  3:12 AM  Result Value Ref Range   Unit Number [id number]    Blood Component Type CRYPOOL THAW    Unit division 00    Status of Unit ISSUED    Transfusion Status      OK TO TRANSFUSE Performed at [facility], [address] [place], [place] [address]   Protime-INR     Status:  Abnormal   Collection Time: 05/21/2022  3:48 AM  Result Value Ref Range   Prothrombin Time >90.0 (H) 11.4 - 15.2 seconds   INR >10.0 (HH) 0.8 - 1.2  Fibrinogen     Status: Abnormal   Collection Time: 05/23/2022  3:48 AM  Result Value Ref Range   Fibrinogen 171 (L) 210 - 475 mg/dL  Magnesium     Status: None   Collection Time: 04/28/2022  4:35 AM  Result Value Ref Range   Magnesium 1.9 1.7 - 2.4 mg/dL  CBC     Status: Abnormal   Collection Time: 05/11/2022  4:35 AM  Result Value Ref Range   WBC 9.7 4.0 - 10.5 K/uL   RBC 4.17 3.87 - 5.11 MIL/uL   Hemoglobin 14.6 12.0 - 15.0 g/dL   HCT 65.742.4 84.636.0 - 96.246.0 %   MCV 101.7 (H) 80.0 - 100.0 fL   MCH  35.0 (H) 26.0 - 34.0 pg   MCHC 34.4 30.0 - 36.0 g/dL   RDW 79.021.1 (H) 24.011.5 - 97.315.5 %   Platelets 57 (L) 150 - 400 K/uL   nRBC 2.1 (H) 0.0 - 0.2 %  Phosphorus     Status: Abnormal   Collection Time: 2022/05/16  4:35 AM  Result Value Ref Range   Phosphorus 1.3 (L) 2.5 - 4.6 mg/dL  Procalcitonin     Status: None   Collection Time: 2022/05/16  4:35 AM  Result Value Ref Range   Procalcitonin 70.83 ng/mL  Protime-INR     Status: Abnormal   Collection Time: 2022/05/16  4:35 AM  Result Value Ref Range   Prothrombin Time 83.6 (H) 11.4 - 15.2 seconds   INR 10.7 (HH) 0.8 - 1.2  Lactic acid, plasma     Status: Abnormal   Collection Time: 2022/05/16  4:35 AM  Result Value Ref Range   Lactic Acid, Venous >9.0 (HH) 0.5 - 1.9 mmol/L  Comprehensive metabolic panel     Status: Abnormal   Collection Time: 2022/05/16  4:35 AM  Result Value Ref Range   Sodium 131 (L) 135 - 145 mmol/L   Potassium 2.9 (L) 3.5 - 5.1 mmol/L   Chloride 90 (L) 98 - 111 mmol/L   CO2 16 (L) 22 - 32 mmol/L   Glucose, Bld 406 (H) 70 - 99 mg/dL   BUN 24 (H) 6 - 20 mg/dL   Creatinine, Ser 5.322.01 (H) 0.44 - 1.00 mg/dL   Calcium 6.1 (LL) 8.9 - 10.3 mg/dL   Total Protein 4.1 (L) 6.5 - 8.1 g/dL   Albumin 1.8 (L) 3.5 - 5.0 g/dL   AST 9,9249,162 (H) 15 - 41 U/L   ALT 1,708 (H) 0 - 44 U/L   Alkaline  Phosphatase 122 38 - 126 U/L   Total Bilirubin 3.5 (H) 0.3 - 1.2 mg/dL   GFR, Estimated 28 (L) >60 mL/min   Anion gap 25 (H) 5 - 15  Acetaminophen level     Status: None   Collection Time: 2022/05/16  4:35 AM  Result Value Ref Range   Acetaminophen (Tylenol), Serum 29 10 - 30 ug/mL  D-dimer, quantitative     Status: Abnormal   Collection Time: 2022/05/16  4:35 AM  Result Value Ref Range   D-Dimer, Quant >20.00 (H) 0.00 - 0.50 ug/mL-FEU  Glucose, capillary     Status: Abnormal   Collection Time: 2022/05/16  5:20 AM  Result Value Ref Range   Glucose-Capillary 318 (H) 70 - 99 mg/dL  Prepare platelet pheresis     Status: None (Preliminary result)   Collection Time: 2022/05/16  5:50 AM  Result Value Ref Range   Unit Number [id number]    Blood Component Type PLTP3 PSORALEN TREATED    Unit division 00    [facility]    Trans[facility] Status      OK TO TRANSFUSE Performed at [facility], [address] [place], [place] [address]    Unit Number [id number]    Blood Component Type PLTP1 PSORALEN TREATED    Unit division 00    Status of Unit ALLOCATED    Transfusion Status OK TO TRANSFUSE   Prepare fresh frozen plasma     Status: None (Preliminary result)   Collection Time: 2022/05/16  5:50 AM  Result Value Ref Range   Unit Number [id number]    Blood Component Type THW PLS APHR    Unit division A0  Status of Unit ISSUED    Transfusion Status      OK TO TRANSFUSE Performed at [facility], [address] [place], [place] [address]      Recent Labs    05/15/2022 2232 05/11/22 0435  WBC 11.7* 9.7  HGB 13.1 14.6  HCT 40.2 42.4  PLT 102* 57*   BMET Recent Labs    05/17/2022 2232 05/14/2022 0614 05/11/2022 0435  NA 128* 134* 131*  K 3.1* 4.1 2.9*  CL 92* 105 90*  CO2 13* <7* 16*  GLUCOSE 85 63* 406*  BUN 32* 23* 24*  CREATININE 1.85* 1.13* 2.01*  CALCIUM 8.2* 6.6* 6.1*   LFT Recent Labs    11-May-2022 0106 May 11, 2022 0435  PROT 4.1*  4.1*  ALBUMIN 1.9* 1.8*  AST 7,017* 9,162*  ALT 1,351* 1,708*  ALKPHOS 124 122  BILITOT 3.3* 3.5*  BILIDIR 2.1*  --   IBILI 1.2*  --    PT/INR Recent Labs    11-May-2022 0348 May 11, 2022 0435  LABPROT >90.0* 83.6*  INR >10.0* 10.7*   Hepatitis Panel Recent Labs    05/17/2022 1343  HEPBSAG NON REACTIVE  HCVAB NON REACTIVE  HEPAIGM NON REACTIVE  HEPBIGM NON REACTIVE   C-Diff No results for input(s): "CDIFFTOX" in the last 72 hours. No results for input(s): "CDIFFPCR" in the last 72 hours.   Studies/Results: DG OR UROLOGY CYSTO IMAGE ([facility] ONLY)  Result Date: 05/24/2022 There is no interpretation for this exam.  This order is for images obtained during a surgical procedure.  Please See "Surgeries" Tab for more information regarding the procedure.   DG Abd 1 View  Result Date: 05/18/2022 CLINICAL DATA:  Confirm OG tube EXAM: ABDOMEN - 1 VIEW COMPARISON:  None Available. FINDINGS: The distal tip of the OG tube is in the distal body of the stomach. IMPRESSION: The distal tip of the OG tube is in the distal body of the stomach. Electronically Signed   By: Gerome Sam III M.D.   On: 05/18/2022 12:35   DG Chest Port 1 View  Result Date: 05/06/2022 CLINICAL DATA:  Endotracheal and OG tube placement. EXAM: PORTABLE CHEST 1 VIEW COMPARISON:  Earlier same day FINDINGS: 0847 hours. Endotracheal tube tip is 4.9 cm above the base of the carina. The NG tube passes into the stomach although the distal tip position is not included on the film. Right IJ central line tip overlies the distal SVC level. The lungs are clear without focal pneumonia, edema, pneumothorax or pleural effusion. The cardiopericardial silhouette is within normal limits for size. Status post right shoulder replacement. Telemetry leads overlie the chest. IMPRESSION: 1. Support apparatus as described. 2. No acute cardiopulmonary findings. Electronically Signed   By: Kennith Center M.D.   On: 05/08/2022 09:16   DG Chest 1  View  Result Date: 05/13/2022 CLINICAL DATA:  Encounter for central line EXAM: CHEST  1 VIEW COMPARISON:  Yesterday FINDINGS: Right IJ line with tip at the upper cavoatrial junction. Interface from artifact over the right apex with no pleural line, no suspected pneumothorax. Normal heart size and mediastinal contours. Symmetric aeration. Right shoulder replacement. IMPRESSION: New central line without complicating features. Electronically Signed   By: Tiburcio Pea M.D.   On: 05/21/2022 08:01   MR ABDOMEN MRCP W WO CONTAST  Result Date: 05/08/2022 CLINICAL DATA:  Gallbladder wall thickening on prior imaging. EXAM: MRI ABDOMEN WITHOUT AND WITH CONTRAST (INCLUDING MRCP) TECHNIQUE: Multiplanar multisequence MR imaging of the abdomen was performed both before  and after the administration of intravenous contrast. Heavily T2-weighted images of the biliary and pancreatic ducts were obtained, and three-dimensional MRCP images were rendered by post processing. CONTRAST:  5mL GADAVIST GADOBUTROL 1 MMOL/ML IV SOLN COMPARISON:  CT and ultrasound exams from earlier the same day FINDINGS: Lower chest: Small bilateral effusions. Hepatobiliary: Liver measures 17.6 cm craniocaudal length, enlarged. Diffuse loss of signal intensity in the liver parenchyma on out of phase T1 imaging is compatible with fatty deposition. Imaging of the liver is markedly motion degraded and smaller subtle lesions could easily be obscured by the artifact. Within this limitation no gross hepatic mass lesion is discernible. No substantial intrahepatic biliary duct dilatation. As noted on the prior studies, there is marked edema and wall thickening of the gallbladder wall with a nondistended lumen. As noted, postcontrast imaging is markedly degraded, but no discrete enhancing mass lesion is discernible within the gallbladder although the mucosa is diffusely hyperenhancing. No gallstones are identified. Common bile duct is nondilated at 3 mm diameter.  No evidence of choledocholithiasis. Pancreas: Pancreas appears to have some edema in the parenchyma with associated edema in the retroperitoneal tissues. Imaging of the pancreas is nondiagnostic on pre and postcontrast T1 imaging due to motion artifact Spleen:  No splenomegaly. No focal mass lesion. Adrenals/Urinary Tract: No adrenal nodule or mass. Kidneys unremarkable. Stomach/Bowel: Stomach is moderately distended with gas and fluid. Duodenal wall appears thick with edema. Small bowel loops in the visualized upper abdomen appear edematous. Vascular/Lymphatic: No abdominal aortic aneurysm. Portal vein and superior mesenteric vein show normal flow signal characteristics compatible with patency. Splenic vein shows diffuse enhancement after contrast administration suggesting patency. Other: There is extensive edema around the gallbladder, in the hepatoduodenal ligament and, and in the retroperitoneal space. Small volume free fluid is seen around the liver and spleen with retroperitoneal edema/fluid noted bilaterally. Musculoskeletal: No focal suspicious marrow enhancement within the visualized bony anatomy. IMPRESSION: 1. Marked edema and wall thickening of the gallbladder wall with a nondistended lumen. No evidence for gallstones an no discrete enhancing mass lesion is discernible within the gallbladder although the mucosa is diffusely hyperenhancing after contrast administration. Gallbladder wall thickening/edema is considered nonspecific in this study given no discernible gallstones and can be seen in the setting of infection/inflammation, volume overload, liver failure, and heart failure. 2. No substantial intrahepatic or extrahepatic biliary duct dilatation. No evidence of choledocholithiasis. 3. Pancreas appears to have some edema in the parenchyma with associated edema in the retroperitoneal tissues. Imaging of the pancreas is nondiagnostic on pre and postcontrast T1 imaging due to motion artifact. 4. Fairly  extensive edema in the gallbladder fossa, hepatoduodenal ligament, retroperitoneal tissues. Jejunal small bowel loops in the upper abdomen appear edematous. 5. Small volume free fluid around the liver and spleen with retroperitoneal edema/fluid noted bilaterally. 6. Hepatomegaly with fatty deposition. 7. Small bilateral effusions. Electronically Signed   By: Kennith CenterEric  Mansell M.D.   On: 06/25/2022 05:46   MR 3D Recon At Scanner  Result Date: 05/09/2022 CLINICAL DATA:  Gallbladder wall thickening on prior imaging. EXAM: MRI ABDOMEN WITHOUT AND WITH CONTRAST (INCLUDING MRCP) TECHNIQUE: Multiplanar multisequence MR imaging of the abdomen was performed both before and after the administration of intravenous contrast. Heavily T2-weighted images of the biliary and pancreatic ducts were obtained, and three-dimensional MRCP images were rendered by post processing. CONTRAST:  5mL GADAVIST GADOBUTROL 1 MMOL/ML IV SOLN COMPARISON:  CT and ultrasound exams from earlier the same day FINDINGS: Lower chest: Small bilateral effusions. Hepatobiliary: Liver measures  17.6 cm craniocaudal length, enlarged. Diffuse loss of signal intensity in the liver parenchyma on out of phase T1 imaging is compatible with fatty deposition. Imaging of the liver is markedly motion degraded and smaller subtle lesions could easily be obscured by the artifact. Within this limitation no gross hepatic mass lesion is discernible. No substantial intrahepatic biliary duct dilatation. As noted on the prior studies, there is marked edema and wall thickening of the gallbladder wall with a nondistended lumen. As noted, postcontrast imaging is markedly degraded, but no discrete enhancing mass lesion is discernible within the gallbladder although the mucosa is diffusely hyperenhancing. No gallstones are identified. Common bile duct is nondilated at 3 mm diameter. No evidence of choledocholithiasis. Pancreas: Pancreas appears to have some edema in the parenchyma with  associated edema in the retroperitoneal tissues. Imaging of the pancreas is nondiagnostic on pre and postcontrast T1 imaging due to motion artifact Spleen:  No splenomegaly. No focal mass lesion. Adrenals/Urinary Tract: No adrenal nodule or mass. Kidneys unremarkable. Stomach/Bowel: Stomach is moderately distended with gas and fluid. Duodenal wall appears thick with edema. Small bowel loops in the visualized upper abdomen appear edematous. Vascular/Lymphatic: No abdominal aortic aneurysm. Portal vein and superior mesenteric vein show normal flow signal characteristics compatible with patency. Splenic vein shows diffuse enhancement after contrast administration suggesting patency. Other: There is extensive edema around the gallbladder, in the hepatoduodenal ligament and, and in the retroperitoneal space. Small volume free fluid is seen around the liver and spleen with retroperitoneal edema/fluid noted bilaterally. Musculoskeletal: No focal suspicious marrow enhancement within the visualized bony anatomy. IMPRESSION: 1. Marked edema and wall thickening of the gallbladder wall with a nondistended lumen. No evidence for gallstones an no discrete enhancing mass lesion is discernible within the gallbladder although the mucosa is diffusely hyperenhancing after contrast administration. Gallbladder wall thickening/edema is considered nonspecific in this study given no discernible gallstones and can be seen in the setting of infection/inflammation, volume overload, liver failure, and heart failure. 2. No substantial intrahepatic or extrahepatic biliary duct dilatation. No evidence of choledocholithiasis. 3. Pancreas appears to have some edema in the parenchyma with associated edema in the retroperitoneal tissues. Imaging of the pancreas is nondiagnostic on pre and postcontrast T1 imaging due to motion artifact. 4. Fairly extensive edema in the gallbladder fossa, hepatoduodenal ligament, retroperitoneal tissues. Jejunal small  bowel loops in the upper abdomen appear edematous. 5. Small volume free fluid around the liver and spleen with retroperitoneal edema/fluid noted bilaterally. 6. Hepatomegaly with fatty deposition. 7. Small bilateral effusions. Electronically Signed   By: Kennith Center M.D.   On: 05/24/2022 05:46   US ABDOMEN LIMITED RUQ (LIVER/GB)  Result Date: 04/29/2022 CLINICAL DATA:  Right upper quadrant abdominal pain EXAM: ULTRASOUND ABDOMEN LIMITED RIGHT UPPER QUADRANT COMPARISON:  None Available. FINDINGS: Gallbladder: There is marked thickening of the gallbladder wall which measures up to 12 mm in thickness. The gallbladder, however, is largely decompressed. No pericholecystic fluid identified. No intraluminal stones or sludge is seen. The sonographic Eulah Pont sign is reportedly POSITIVE. Common bile duct: Diameter: 4 mm in proximal diameter Liver: Normal hepatic parenchymal echogenicity and echotexture. No focal intrahepatic masses are seen and there is no intrahepatic biliary ductal dilation. Portal vein is patent on color Doppler imaging with normal direction of blood flow towards the liver. Other: None. IMPRESSION: 1. Marked thickening of the gallbladder wall, nonspecific. This can be seen in the setting of hepatic or biliary inflammation, volume overload/anasarca, or cardiogenic failure. Correlation with liver enzymes may be  helpful aeration. Electronically Signed   By: Helyn Numbers M.D.   On: 04/30/2022 02:39   CT ABDOMEN PELVIS WO CONTRAST  Result Date: 05/17/2022 CLINICAL DATA:  Abdominal pain, acute, nonlocalized EXAM: CT ABDOMEN AND PELVIS WITHOUT CONTRAST TECHNIQUE: Multidetector CT imaging of the abdomen and pelvis was performed following the standard protocol without IV contrast. RADIATION DOSE REDUCTION: This exam was performed according to the departmental dose-optimization program which includes automated exposure control, adjustment of the mA and/or kV according to patient size and/or use of iterative  reconstruction technique. COMPARISON:  01/14/2019 FINDINGS: Lower chest: No acute abnormality. Hepatobiliary: At least mild hepatic steatosis. No focal intrahepatic mass identified on this noncontrast examination. Focal fatty hepatic infiltration adjacent the falciform ligament. The gallbladder wall is thickened and there is infiltration within the porta hepatis which may reflect changes of a inflammatory process involving the biliary tree, head of the pancreas, or second portion of the duodenum. No loculated perihepatic fluid collections are identified. No definite intra or extrahepatic biliary ductal dilation. The gallbladder is not distended. Pancreas: As noted above, mild peripancreatic inflammatory changes are identified surrounding the head of the pancreas centered within the pancreatico duodenal groove and extending into the porta hepatis. The body and tail the pancreas are unremarkable. The pancreatic duct is not dilated. No peripancreatic loculated fluid collections are identified. No parenchymal calcifications are seen. Spleen: Unremarkable Adrenals/Urinary Tract: The adrenal glands are unremarkable. The kidneys are normal in size and position. Mild left hydronephrosis and perinephric stranding has developed with a 4 mm calculus seen anterior to the left psoas, possibly within the proximal left ureter. Mild surrounding inflammatory stranding suggests periureteric stranding related to urothelial inflammation. No hydronephrosis on the right. There are bilateral nonobstructing renal calculi identified measuring up to 5 mm on the right and 2 mm on the left. No additional ureteral calculi. The bladder is unremarkable. Stomach/Bowel: Stomach is within normal limits. Appendix absent. No evidence of bowel wall thickening, distention, or inflammatory changes. Vascular/Lymphatic: Aortic atherosclerosis. No enlarged abdominal or pelvic lymph nodes. Reproductive: Uterus and bilateral adnexa are unremarkable. Other: No  abdominal wall hernia. Musculoskeletal: Osseous structures are age-appropriate. No acute bone abnormality. No lytic or blastic bone lesion. Multiple healed right rib fractures again noted. IMPRESSION: 1. Mild left hydronephrosis and perinephric stranding probable obstructing 4 mm calculus within the proximal left ureter. Mild surrounding inflammatory stranding suggests periureteric stranding related to urothelial inflammation. Correlation with urinalysis and urine culture may be helpful. Superimposed mild bilateral nonobstructing nephrolithiasis. 2. Mild hepatic steatosis. 3. Gallbladder wall thickening and soft tissue infiltration within the porta hepatis. This may reflect changes of a inflammatory process involving the biliary tree, head of the pancreas, or second portion of the duodenum. Correlation with liver enzymes and serum amylase and lipase levels may be helpful for further evaluation. 4. Aortic atherosclerosis. Aortic Atherosclerosis (ICD10-I70.0). Electronically Signed   By: Helyn Numbers M.D.   On: 05/11/2022 00:52   DG Chest Port 1 View  Result Date: 05/26/2022 CLINICAL DATA:  Questionable sepsis EXAM: PORTABLE CHEST 1 VIEW COMPARISON:  Chest x-ray 10/29/2020 FINDINGS: The heart size and mediastinal contours are within normal limits. Both lungs are clear. Right shoulder arthroplasty again noted. No acute fractures. IMPRESSION: No active disease. Electronically Signed   By: Darliss Cheney M.D.   On: 05/18/2022 22:52    Scheduled Inpatient Medications:    Chlorhexidine Gluconate Cloth  6 each Topical Daily   docusate  100 mg Per Tube BID   folic acid  1 mg Intravenous Daily   hydrocortisone sod succinate (SOLU-CORTEF) inj  50 mg Intravenous Q8H   insulin aspart  0-20 Units Subcutaneous Q4H   multivitamin  15 mL Per Tube Daily   mouth rinse  15 mL Mouth Rinse Q2H   pantoprazole (PROTONIX) IV  40 mg Intravenous Q12H   polyethylene glycol  17 g Per Tube Daily   thiamine (VITAMIN B1) injection   100 mg Intravenous Daily    Continuous Inpatient Infusions:    sodium chloride     acetylcysteine 15 mg/kg/hr (2022-06-01 0716)   fentaNYL infusion INTRAVENOUS 150 mcg/hr (06/01/2022 0614)   norepinephrine (LEVOPHED) Adult infusion 5 mcg/min (06-01-22 4827)   phenylephrine (NEO-SYNEPHRINE) Adult infusion Stopped (05/19/2022 1421)   piperacillin-tazobactam (ZOSYN)  IV 12.5 mL/hr at 06-01-2022 0786   sodium bicarbonate 150 mEq in dextrose 5 % 1,150 mL infusion 200 mL/hr at Jun 01, 2022 0802   vancomycin     vasopressin 0.03 Units/min (04/30/2022 1453)    PRN Inpatient Medications:  docusate sodium, fentaNYL, midazolam, mouth rinse, polyethylene glycol  Miscellaneous: NA  Assessment:  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 Recent DNR status designation. Poor prognosis  Plan:  Per ICU team GI sign off. Call back if we can help.  Cynda Soule K. Norma Fredrickson, M.D. 2022-06-01, 8:05 AM

## 2022-05-03 NOTE — Progress Notes (Addendum)
I was called to the bedside to evaluate an actively bleeding arterial line site despite manual pressure being applied for more than 10 minutes.  On arrival to the bedside, Vital signs showed HR of beats/minute, BP mm Hg, the RR 30 breaths/minute, and the oxygen saturation % on and a temperature of 98.33F (36.9C). Patient was bleeding profusely from the arterial line site, multiple 4x4 gauze was saturated with blood. Additional manual pressure applied for a 10 minutes with no improvement. Extremities were pale with bluish discoloration. At this point decision made to use a TR Band to stop bleeding, while waiting for the band, a coband wrap was applied tightly directly over the already soaked gauze to create a tamponade which was effective in stopping the bleeding.  STAT Labs Obtained: Chemistry:Na+/ K+: pending Glucose: BUN/Cr.:pending:  AST/ALT:7,017/1351 CBC: Hgb/Hct:14.6/42.4 Plts: 57 Other Lab findings: Fibrinogen:171, PT/INR: >90/>10.0, Lactic >9.0 Arterial Blood Gas result:  pO2 94; pCO2 22; pH 7.35;  HCO3 12.1, %O2 Sat 98.9.     Coagulopathy ? DIC  from severe sepsis vs Hepatic Dysfunction  Plts 57, PT/INR elevated. Decreased Fibrinogen -Give Vitamin K -Administer FFP with goal PT and PTT <1.5  -Give 1 unit of platelets and Cryoprecipitate to raise level to 100-150 -Give Folate -Serial labs, CBC, PT/INR -Monitor for bleeding -Pressors  for MAP goal >65     Rufina Falco, DNP, CCRN, FNP-C, AGACNP-BC Acute Care & Family Nurse Practitioner  [place]  See Amion for personal pager PCCM on call pager [phone] until 7 am

## 2022-05-04 DIAGNOSIS — K922 Gastrointestinal hemorrhage, unspecified: Secondary | ICD-10-CM | POA: Diagnosis not present

## 2022-05-04 DIAGNOSIS — B9620 Unspecified Escherichia coli [E. coli] as the cause of diseases classified elsewhere: Secondary | ICD-10-CM | POA: Diagnosis not present

## 2022-05-04 DIAGNOSIS — R6521 Severe sepsis with septic shock: Secondary | ICD-10-CM | POA: Diagnosis not present

## 2022-05-04 DIAGNOSIS — A419 Sepsis, unspecified organism: Secondary | ICD-10-CM | POA: Diagnosis not present

## 2022-05-04 DIAGNOSIS — R7881 Bacteremia: Secondary | ICD-10-CM | POA: Diagnosis not present

## 2022-05-04 LAB — BPAM FFP
Blood Product Expiration Date: 202401132359
ISSUE DATE / TIME: 202401080700
Unit Type and Rh: 5100

## 2022-05-04 LAB — PREPARE PLATELET PHERESIS
Unit division: 0
Unit division: 0

## 2022-05-04 LAB — BPAM PLATELET PHERESIS
Blood Product Expiration Date: 202401092359
Blood Product Expiration Date: 202401112359
ISSUE DATE / TIME: 202401080937
ISSUE DATE / TIME: 202401090901
Unit Type and Rh: 5100
Unit Type and Rh: 7300

## 2022-05-04 LAB — BLOOD GAS, ARTERIAL
Acid-base deficit: 15.2 mmol/L — ABNORMAL HIGH (ref 0.0–2.0)
Bicarbonate: 9.6 mmol/L — ABNORMAL LOW (ref 20.0–28.0)
FIO2: 28 %
MECHVT: 450 mL
Mechanical Rate: 28
O2 Saturation: 99.2 %
PEEP: 5 cmH2O
Patient temperature: 37
pCO2 arterial: 21 mmHg — ABNORMAL LOW (ref 32–48)
pH, Arterial: 7.27 — ABNORMAL LOW (ref 7.35–7.45)
pO2, Arterial: 104 mmHg (ref 83–108)

## 2022-05-04 LAB — COMPREHENSIVE METABOLIC PANEL
ALT: 2703 U/L — ABNORMAL HIGH (ref 0–44)
AST: >10000 U/L — ABNORMAL HIGH (ref 15–41)
Albumin: 1.6 g/dL — ABNORMAL LOW (ref 3.5–5.0)
Alkaline Phosphatase: 174 U/L — ABNORMAL HIGH (ref 38–126)
Anion gap: 30 — ABNORMAL HIGH (ref 5–15)
BUN: 24 mg/dL — ABNORMAL HIGH (ref 6–20)
CO2: 25 mmol/L (ref 22–32)
Calcium: 5.7 mg/dL — CL (ref 8.9–10.3)
Chloride: 79 mmol/L — ABNORMAL LOW (ref 98–111)
Creatinine, Ser: 2.69 mg/dL — ABNORMAL HIGH (ref 0.44–1.00)
GFR, Estimated: 20 mL/min — ABNORMAL LOW (ref >60)
Glucose, Bld: 57 mg/dL — ABNORMAL LOW (ref 70–99)
Potassium: 5 mmol/L (ref 3.5–5.1)
Sodium: 134 mmol/L — ABNORMAL LOW (ref 135–145)
Total Bilirubin: 4 mg/dL — ABNORMAL HIGH (ref 0.3–1.2)
Total Protein: 3.8 g/dL — ABNORMAL LOW (ref 6.5–8.1)

## 2022-05-04 LAB — THYROID PANEL WITH TSH
Free Thyroxine Index: 1.3 (ref 1.2–4.9)
T3 Uptake Ratio: 38 % (ref 24–39)
T4, Total: 3.5 ug/dL — ABNORMAL LOW (ref 4.5–12.0)
TSH: 0.78 u[IU]/mL (ref 0.450–4.500)

## 2022-05-04 LAB — CBC
HCT: 32.4 % — ABNORMAL LOW (ref 36.0–46.0)
Hemoglobin: 11.5 g/dL — ABNORMAL LOW (ref 12.0–15.0)
MCH: 35.4 pg — ABNORMAL HIGH (ref 26.0–34.0)
MCHC: 35.5 g/dL (ref 30.0–36.0)
MCV: 99.7 fL (ref 80.0–100.0)
Platelets: 50 10*3/uL — ABNORMAL LOW (ref 150–400)
RBC: 3.25 MIL/uL — ABNORMAL LOW (ref 3.87–5.11)
RDW: 20.8 % — ABNORMAL HIGH (ref 11.5–15.5)
WBC: 5.8 10*3/uL (ref 4.0–10.5)
nRBC: 1.9 % — ABNORMAL HIGH (ref 0.0–0.2)

## 2022-05-04 LAB — GLUCOSE, CAPILLARY
Glucose-Capillary: 103 mg/dL — ABNORMAL HIGH (ref 70–99)
Glucose-Capillary: 145 mg/dL — ABNORMAL HIGH (ref 70–99)
Glucose-Capillary: 30 mg/dL — CL (ref 70–99)
Glucose-Capillary: 32 mg/dL — CL (ref 70–99)
Glucose-Capillary: 97 mg/dL (ref 70–99)

## 2022-05-04 LAB — LACTIC ACID, PLASMA: Lactic Acid, Venous: >9 mmol/L (ref 0.5–1.9)

## 2022-05-04 LAB — PROTIME-INR
INR: >10 (ref 0.8–1.2)
Prothrombin Time: >90 seconds — ABNORMAL HIGH (ref 11.4–15.2)

## 2022-05-04 LAB — CULTURE, BLOOD (ROUTINE X 2): Special Requests: ADEQUATE

## 2022-05-04 LAB — BLOOD GAS, VENOUS
Acid-base deficit: 25.7 mmol/L — ABNORMAL HIGH (ref 0.0–2.0)
Bicarbonate: 5.6 mmol/L — ABNORMAL LOW (ref 20.0–28.0)
O2 Saturation: 94 %
Patient temperature: 37
pCO2, Ven: 26 mmHg — ABNORMAL LOW (ref 44–60)
pH, Ven: <6.95 — CL (ref 7.25–7.43)
pO2, Ven: 76 mmHg — ABNORMAL HIGH (ref 32–45)

## 2022-05-04 LAB — PROCALCITONIN: Procalcitonin: 77.58 ng/mL

## 2022-05-04 LAB — PREPARE FRESH FROZEN PLASMA

## 2022-05-04 LAB — MAGNESIUM: Magnesium: 1.9 mg/dL (ref 1.7–2.4)

## 2022-05-04 LAB — ANA W/REFLEX IF POSITIVE: Anti Nuclear Antibody (ANA): NEGATIVE

## 2022-05-04 LAB — ACETAMINOPHEN LEVEL: Acetaminophen (Tylenol), Serum: 15 ug/mL (ref 10–30)

## 2022-05-04 LAB — PHOSPHORUS: Phosphorus: 3.6 mg/dL (ref 2.5–4.6)

## 2022-05-04 MED ORDER — HYDROMORPHONE BOLUS VIA INFUSION
1.0000 mg | INTRAVENOUS | Status: DC | PRN
  Administered 2022-05-04 (×6): 1 mg via INTRAVENOUS

## 2022-05-04 MED ORDER — ACETAMINOPHEN 325 MG PO TABS: 650.0000 mg | ORAL_TABLET | Freq: Four times a day (QID) | ORAL | Status: DC | PRN

## 2022-05-04 MED ORDER — GLYCOPYRROLATE 0.2 MG/ML IJ SOLN: 0.2000 mg | INTRAMUSCULAR | Status: DC | PRN

## 2022-05-04 MED ORDER — HYDROMORPHONE HCL-NACL 50-0.9 MG/50ML-% IV SOLN
1.0000 mg/h | INTRAVENOUS | Status: DC
  Filled 2022-05-04: qty 50

## 2022-05-04 MED ORDER — GLYCOPYRROLATE 1 MG PO TABS: 1.0000 mg | ORAL_TABLET | ORAL | Status: DC | PRN

## 2022-05-04 MED ORDER — VITAMIN K1 10 MG/ML IJ SOLN
10.0000 mg | Freq: Once | INTRAVENOUS | Status: AC
  Administered 2022-05-04: 10 mg via INTRAVENOUS
  Filled 2022-05-04: qty 1

## 2022-05-04 MED ORDER — MIDAZOLAM HCL 2 MG/2ML IJ SOLN
2.0000 mg | INTRAMUSCULAR | Status: DC | PRN
  Administered 2022-05-04: 4 mg via INTRAVENOUS
  Filled 2022-05-04: qty 4

## 2022-05-04 MED ORDER — HYDROMORPHONE HCL-NACL 50-0.9 MG/50ML-% IV SOLN
0.0000 mg/h | INTRAVENOUS | Status: DC
  Administered 2022-05-04: 1 mg/h via INTRAVENOUS

## 2022-05-04 MED ORDER — ACETAMINOPHEN 650 MG RE SUPP: 650.0000 mg | Freq: Four times a day (QID) | RECTAL | Status: DC | PRN

## 2022-05-04 MED ORDER — POLYVINYL ALCOHOL 1.4 % OP SOLN: 1.0000 [drp] | Freq: Four times a day (QID) | OPHTHALMIC | Status: DC | PRN

## 2022-05-04 MED ORDER — SODIUM CHLORIDE 0.9 % IV SOLN: INTRAVENOUS | Status: DC

## 2022-05-04 MED ORDER — DEXTROSE 50 % IV SOLN: 25.0000 g | INTRAVENOUS | Status: AC

## 2022-05-04 MED ORDER — HYDROMORPHONE HCL-NACL 50-0.9 MG/50ML-% IV SOLN: 2.0000 mg/h | INTRAVENOUS | Status: DC

## 2022-05-04 MED ORDER — GLYCOPYRROLATE 0.2 MG/ML IJ SOLN
0.4000 mg | INTRAMUSCULAR | Status: DC
  Administered 2022-05-04: 0.4 mg via INTRAVENOUS
  Filled 2022-05-04: qty 2

## 2022-05-04 MED ORDER — DEXTROSE 50 % IV SOLN
INTRAVENOUS | Status: AC
  Administered 2022-05-04: 50 mL
  Filled 2022-05-04: qty 50

## 2022-05-05 LAB — CULTURE, RESPIRATORY W GRAM STAIN: Culture: NO GROWTH

## 2022-05-05 LAB — URINE CULTURE: Culture: 1000 — AB

## 2022-05-05 LAB — BPAM CRYOPRECIPITATE
Blood Product Expiration Date: 202401080945
ISSUE DATE / TIME: 202401080505
Unit Type and Rh: 5100

## 2022-05-05 LAB — PREPARE CRYOPRECIPITATE: Unit division: 0

## 2022-05-05 LAB — CERULOPLASMIN: Ceruloplasmin: 18.1 mg/dL — ABNORMAL LOW (ref 19.0–39.0)

## 2022-05-24 ENCOUNTER — Encounter: Payer: Self-pay | Admitting: Urology

## 2022-05-27 NOTE — Progress Notes (Signed)
MEDICATION RELATED CONSULT NOTE  Pharmacy Consult for N-AC  Indication: APAP overdose (chronic)   Allergies  Allergen Reactions   Compazine [Prochlorperazine Edisylate] Other (See Comments)    euphoria   Ivp Dye [Iodinated Contrast Media] Anaphylaxis    "stopped her heart"   Baclofen Other (See Comments)    dizziness   Imitrex [Sumatriptan] Swelling    "throat swelling"   Voltaren [Diclofenac] Other (See Comments)    Cannot hold onto objects (drops things)    Patient Measurements: Height: 5\' 4"  (162.6 cm) Weight: 60.5 kg (133 lb 6.1 oz) IBW/kg (Calculated) : 54.7 Adjusted Body Weight: 57 kg  Vital Signs: Temp: 98.8 F (37.1 C) 2022/05/30 0600) BP: 85/73 05-30-2022 0500) Pulse Rate: 109 May 30, 2022 0600) Intake/Output from previous day: 01/08 0701 - 30-May-2022 0700 In: 7345.9 [I.V.:5779.9; Blood:556; NG/GT:310; IV Piggyback:690.1] Out: 50 [Urine:50]  Labs: Recent Labs    05/22/2022 2232 05/16/2022 0005 05/07/2022 0614 05/18/2022 1343 05/23/2022 0106 05/12/2022 0435 04/26/2022 1309 05/07/2022 1828 May 30, 2022 0538  WBC 11.7*  --   --   --   --  9.7  --   --  5.8  HGB 13.1  --   --   --   --  14.6  --   --  11.5*  HCT 40.2  --   --   --   --  42.4  --   --  32.4*  PLT 102*  --   --   --   --  57*  --   --  50*  APTT  --  43*  --   --   --   --   --   --   --   CREATININE 1.85*  --    < >  --   --  2.01* 2.29* 2.40*  --   MG 1.5*  --   --   --   --  1.9  --  1.6*  --   PHOS  --   --   --  4.2  --  1.3*  --  1.6*  --   ALBUMIN 3.2*  --    < >  --  1.9* 1.8* 1.8*  --   --   PROT 6.4*  --    < >  --  4.1* 4.1* 4.1*  --   --   AST 1,205*  --    < >  --  7,017* 9,162* >10,000*  --   --   ALT 252*  --    < >  --  1,351* 1,708* 2,237*  --   --   ALKPHOS 114  --    < >  --  124 122 128*  --   --   BILITOT 1.6*  --    < >  --  3.3* 3.5* 3.5*  --   --   BILIDIR  --   --   --   --  2.1*  --  2.1*  --   --   IBILI  --   --   --   --  1.2*  --  1.4*  --   --    < > = values in this interval not displayed.     Estimated Creatinine Clearance: 21.5 mL/min (A) (by C-G formula based on SCr of 2.4 mg/dL (H)).  Medical History: Past Medical History:  Diagnosis Date   Anxiety    Arthritis    Bipolar 1 disorder (HCC)    Chronic pain  Depression    Eczema    Fibromyalgia    GERD (gastroesophageal reflux disease)    Headache    migraines   History of hiatal hernia    Neuromuscular disorder (HCC)    Seizures ([place])    Assessment: Ariel White is a 62 y.o. 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 PMH significant for anxiety, arthritis, bipolar 1 disorder, depression, fibromyalgia, GERD, migraines, seizures. She reportedly has been taking APAP 650 mg x3-4 tablets TID along with Tylenol PM and trazodone QHS for unspecified period of time. Pharmacy has been consulted to manage N-AC treatment.  [place] [facility] contacted overnight 1/7 and recommended to begin N-AC. Patient completed N-AC 8160 mg (150 mg/kg) IV x1 and continues on N-AC (15 mg/kg) IV continuous infusion. Patient completed Fomepizole 15 mg/kg (820 mg) IV x1 on 1/7.   Baseline Labs: AST 1205, ALT 252, SCr 1.85, K 3.1, APAP 93, Salicylate <3.2, INR 1.9   Goal of Therapy:  APAP: 10-30  LFTs: WNL (or trending down) INR: <2   Monitoring:  Date Time Labs 1/8 0106 AST 7017, ALT 1351, APAP 34, INR >10 1/8 1309 AST >10k, ALT 2237, APAP 24, INR 7.8 05-10-2022 0538 AST >10k, ALT 2703, APAP 15, INR >10  Plan:  APAP improving, LFTs & INR continue to increase Continue N-AC continuous infusion at current rate until asymptomatic, AST & ALT WNL, INR <2, and APAP <10 Continue bicarb infusion @ 200 mL/hr Check APAP, CMP, PT/INR with AM labs  Gretel Acre, PharmD PGY1 Pharmacy Resident 2022-05-10 7:36 AM

## 2022-05-27 NOTE — Progress Notes (Signed)
  Chaplain On-Call discovered a Spiritual Care Consult placed at 1509 on 05/11/2022.  Chaplain learned that the patient has died on 05/19/22 at 1414 hours.  Chaplain was not notified about the patient's death.  Chaplain Pollyann Samples M.Div., [facility]

## 2022-05-27 NOTE — IPAL (Signed)
  Interdisciplinary Goals of Care Family Meeting   Date carried out: May 07, 2022  Location of the meeting: Bedside  Member's involved: Physician, Bedside Registered Nurse, and Family Member or next of kin    GOALS OF CARE DISCUSSION  The Clinical status was relayed to family in detail-[name] at Bedside Son does NOT want to be part of any decision making  Updated and notified of patients medical condition- Patient remains unresponsive and will not open eyes to command.   Patient is having a weak cough and struggling to remove secretions.   Patient with increased WOB and using accessory muscles to breathe Explained to family course of therapy and the modalities   Patient with Progressive multiorgan failure with a very high probablity of a very minimal chance of meaningful recovery despite all aggressive and optimal medical therapy.  PATIENT REMAINS DNR/DNI  Family understands the situation.  They have consented and agreed to DNR/DNI and would like to proceed with Comfort care measures.  Family are satisfied with Plan of action and management. All questions answered  Additional CC time 25 mins   Ariel White [name], M.D.  [name]  Medical Director [place] Director [facility] Cardio-Pulmonary Department

## 2022-05-27 NOTE — Progress Notes (Signed)
Hypoglycemic Event  CBG: 30  Treatment: D50 50 mL (25 gm)  Symptoms: Shaky  Follow-up CBG: Time:0800 CBG Result:145  Possible Reasons for Event: Inadequate meal intake and Other: Disease related  Comments/MD notified:Dr. Zetta Bills

## 2022-05-27 NOTE — Progress Notes (Signed)
NAME:  Ariel White, MRN:  [id number], DOB:  22-Aug-1960, LOS: 2 ADMISSION DATE:  05/11/2022, CONSULTATION DATE: 05/17/2022 REFERRING MD: Dr. Fuller Plan, CHIEF COMPLAINT: Shortness of Breath   History of Present Illness:  This is a 62 yo female who presented to [facility] ER on 01/7 via EMS with c/o shortness of breath, abdominal pain, generalized weakness, and dark tarry stools onset 3 days prior to presentation.  She denies taking blood thinners, however she does endorse taking frequent doses of tylenol. Pts sister reported the pt has also had alcohol use as well.  The pt reported to EMS she had not eaten in several days.  En route to the ER pt became hypotensive bp 55/36/heart rate 114/RA 98%, code sepsis activated.    ED Course  Upon arrival to the ER pt remained profoundly hypotensive, hypothermic, and tachycardiac.  Pt placed on 6L O2 via nasal canula.  She received 2L of NS and 2 units of emergent pRBC's.  She also received zofran/protonix/metronidazole/vancomycin. Significant labs were Na+ 128/K+ 3.1/chloride 92/CO2 13/BUN 32/creatinine 1.85/anion gap 23/magnesium 1.5/AST 1,205/ALT 252/lactic acid >9.0/wbc 11.7/platelets 102/tylenol level 93/salicylate <7.0. Due to concern of unintentional tylenol overdose poison control contacted and recommended acetylcysteine load followed by continuous gtt and 820 mg iv antizol x1 dose.  CT Abd/Pelvis concerning for mild left hydronephrosis/perinephric stranding probable obstructing 4 mm calculus within the proximal left ureter/mild hepatic steatosis/gallbladder wall thickening and soft tissue infiltration within the porta hepatis.  Urology consulted by ER provider and at that time held off on stent placement with recommendation to stabilize the pt with plans for stent placement in the am.  However, pt later decompensated and stent placement put on hold.  Due to concern of possible choledocholithiasis MRCP and Abdominal US ordered.  Results were negative for choledocholithiasis  (see detailed report below).  General Surgery consulted with recommendations to treat tylenol overdose with low suspicion for cholecystitis.  While in the ER pt became hypotensive requiring levophed gtt.  PCCM team contacted for ICU admission.     CXR: No active disease.  CT Head/Cervical Spine:  No acute intracranial abnormalities. Chronic atrophy and small vessel ischemic changes. Postoperative changes in the skull base. Alignment of the cervical spine is unchanged since prior study consistent with degenerative change. No acute displaced fractures identified.  CT Elbow Right: Acute supracondylar fracture involving the distal humerus is identified. The fracture fragments are in near anatomic alignment. Remote distal diaphyseal fracture of the distal humerus.  CT Abd/Pelvis:  Mild left hydronephrosis and perinephric stranding probable obstructing 4 mm calculus within the proximal left ureter. Mild surrounding inflammatory stranding suggests periureteric stranding related to urothelial inflammation. Correlation with urinalysis and urine culture may be helpful. Superimposed mild bilateral nonobstructing nephrolithiasis. Mild hepatic steatosis. Gallbladder wall thickening and soft tissue infiltration within the porta hepatis. This may reflect changes of a inflammatory process involving the biliary tree, head of the pancreas, or second portion of the duodenum. Correlation with liver enzymes and serum amylase and lipase levels may be helpful for further evaluation. Aortic atherosclerosis. (ICD10-I70.0).  MRCP Abd:  Marked edema and wall thickening of the gallbladder wall with a nondistended lumen. No evidence for gallstones an no discrete enhancing mass lesion is discernible within the gallbladder although the mucosa is diffusely hyperenhancing after contrast administration. Gallbladder wall thickening/edema is considered nonspecific in this study given no discernible gallstones and can be seen in the  setting of infection/inflammation, volume overload, liver failure, and heart failure. No substantial intrahepatic or extrahepatic  biliary duct dilatation. No evidence of choledocholithiasis. Pancreas appears to have some edema in the parenchyma with associated edema in the retroperitoneal tissues. Imaging of the pancreas is nondiagnostic on pre and postcontrast T1 imaging due to motion artifact. Fairly extensive edema in the gallbladder fossa, hepatoduodenal ligament, retroperitoneal tissues. Jejunal small bowel loops in the upper abdomen appear edematous. Small volume free fluid around the liver and spleen with retroperitoneal edema/fluid noted bilaterally. Hepatomegaly with fatty deposition. Small bilateral effusions.  Pertinent  Medical History  Anxiety  Arthritis  Bipolar 1 Disorder  Chronic Pain  Depression  Eczema  Fibromyalgia  GERD  Migraines  Hiatal Hernia  Neuromuscular Disorder  Seizures   Significant Hospital Events: Including procedures, antibiotic start and stop dates in addition to other pertinent events   May 21, 2022: Pt admitted to ICU with anion gap metabolic acidosis, acute respiratory failure, and septic shock along with 4 mm obstructing ureteral calculus; liver failure secondary to unintentional tylenol overdose along with alcohol use and possible acute cholecystitis requiring mechanical intubation and vasopressor support BLOOD CX + E coli/ENTEROBACTER SPECIES 05-21-22: Attempted to transfer pt to [facility], however [facility] declined transfer multiorgan failure 1/8 multiorgan failure 1/9 multiorgan failure patient is dying  Interim History / Subjective:  Remains on vent Severe cyanosis Liver failure Renal failure Patient multiorgan failure  and dying process  Vent Mode: PRVC FiO2 (%):  [28 %] 28 % Set Rate:  [28 bmp] 28 bmp Vt Set:  [450 mL] 450 mL PEEP:  [5 cmH20] 5 cmH20 Plateau Pressure:  [15 cmH20-25 cmH20] 15 cmH20   Objective   Blood pressure (!)  90/54, pulse (!) 102, temperature 98.8 F (37.1 C), temperature source Esophageal, resp. rate (!) 28, height 5\' 4"  (1.626 m), weight 60.5 kg, SpO2 97 %.    Vent Mode: PRVC FiO2 (%):  [28 %] 28 % Set Rate:  [28 bmp] 28 bmp Vt Set:  [450 mL] 450 mL PEEP:  [5 cmH20] 5 cmH20 Plateau Pressure:  [15 cmH20-25 cmH20] 15 cmH20   Intake/Output Summary (Last 24 hours) at 05/16/2022 0725 Last data filed at 04/29/2022 0500 Gross per 24 hour  Intake 7345.94 ml  Output 50 ml  Net 7295.94 ml    Filed Weights   2022-05-21 0857 05/16/2022 0500 04/28/2022 0402  Weight: 55 kg 60.5 kg 60.5 kg   Antibiotics Given (last 72 hours)     Date/Time Action Medication Dose Rate   05/09/2022 2329 New Bag/Given   ceFEPIme (MAXIPIME) 2 g in sodium chloride 0.9 % 100 mL IVPB 2 g 200 mL/hr   05/23/2022 2336 New Bag/Given   metroNIDAZOLE (FLAGYL) IVPB 500 mg 500 mg 100 mL/hr   21-May-2022 1111 New Bag/Given   piperacillin-tazobactam (ZOSYN) IVPB 3.375 g 3.375 g 12.5 mL/hr   May 21, 2022 1134 New Bag/Given   vancomycin (VANCOREADY) IVPB 1250 mg/250 mL 1,250 mg 166.7 mL/hr   05/21/22 1622 New Bag/Given   piperacillin-tazobactam (ZOSYN) IVPB 3.375 g 3.375 g 12.5 mL/hr   05/21/22 2210 New Bag/Given   piperacillin-tazobactam (ZOSYN) IVPB 3.375 g 3.375 g 12.5 mL/hr   04/28/2022 0530 New Bag/Given   piperacillin-tazobactam (ZOSYN) IVPB 3.375 g 3.375 g 12.5 mL/hr   05/24/2022 2207 New Bag/Given   cefTRIAXone (ROCEPHIN) 2 g in sodium chloride 0.9 % 100 mL IVPB 2 g 200 mL/hr         REVIEW OF SYSTEMS  PATIENT IS UNABLE TO PROVIDE COMPLETE REVIEW OF SYSTEMS DUE TO SEVERE CRITICAL ILLNESS   PHYSICAL EXAMINATION:  GENERAL:critically ill  appearing, +resp distress EYES: Pupils equal, round, reactive to light.  No scleral icterus.  MOUTH: Moist mucosal membrane. INTUBATED NECK: Supple.  PULMONARY: Lungs clear to auscultation, +rhonchi, +wheezing CARDIOVASCULAR: S1 and S2.  Regular rate and rhythm GASTROINTESTINAL: Soft, nontender,  -distended. Positive bowel sounds.  MUSCULOSKELETAL: No swelling, clubbing, or edema.  NEUROLOGIC: obtunded,sedated SKIN:       Assessment & Plan:  62 yo white female with severe Acute respiratory failure in the setting of anion gap metabolic acidosis due to septic shock with e coli bacteremia due to infected kidney stone complicated by acute and severe metabolic encephalopathy with liver and renal failure in setting of ETOH abuse  Severe ACUTE Hypoxic and Hypercapnic Respiratory Failure -continue Mechanical Ventilator support -Wean Fio2 and PEEP as tolerated -VAP/VENT bundle implementation - Wean PEEP & FiO2 as tolerated, maintain SpO2 > 88% - Head of bed elevated 30 degrees, VAP protocol in place - Plateau pressures less than 30 cm H20  - Intermittent chest x-ray & ABG PRN - Ensure adequate pulmonary hygiene  -will NOT perform SAT/SBT  SEPTIC shock SOURCE-UTI KIDNEY STONE acalculous cholecystitis and 4 mm obstructing left ureteral calculus  -use vasopressors to keep MAP>65 as needed -follow ABG and LA as needed  stress dose steroids   NEUROLOGY ACUTE METABOLIC ENCEPHALOPATHY Hx: Seizure disorder, bipolar 1 disorder, anxiety, depression, ETOH use, and chronic pain  - Correct metabolic derangements  - Hold outpatient olanzapine, trazodone, and lamictal for now  - Thiamine, folic acid, and MVI  - Seizure precautions  - Monitor for ETOH withdrawal    PATIENT WITH MULTIORGAN FAILURE AND HIGH RISK FOR DEATH PATIENT IS IN THE DYING PROCESS    ACUTE KIDNEY INJURY/Renal Failure -continue Foley Catheter-assess need -Avoid nephrotoxic agents -Follow urine output, BMP -Ensure adequate renal perfusion, optimize oxygenation -Renal dose medications NOT A CANDIDATE FOR HD   Intake/Output Summary (Last 24 hours) at 2022-05-22 1610 Last data filed at 22-May-2022 0500 Gross per 24 hour  Intake 7345.94 ml  Output 50 ml  Net 7295.94 ml   LIVER FAILURE Acute liver failure  secondary to unintentional tylenol overdose and ETOH abuse  [facility] will NOT accept for liver transplant LFT's and INR worsening   Thrombocytopenia and elevated PT/INR in the setting of acute liver failure  Melena  - Trend CBC  - Monitor for s/sx of bleeding and transfuse for hgb <7 - VTE px: SCD's; avoid chemical px for now    Best Practice (right click and "Reselect all SmartList Selections" daily)   Diet/type: NPO DVT prophylaxis: SCD GI prophylaxis: PPI Lines: Central line Foley:  Yes, and it is still needed Code Status:  DNR 05/05/2022: Pts sister Leane Para updated at bedside regarding pt condition and current plan of care. The Son does NOT want to be involved with patient care  Labs   CBC: Recent Labs  Lab 05/05/2022 2232 05/10/2022 0435 05/22/2022 0538  WBC 11.7* 9.7 5.8  NEUTROABS 9.4*  --   --   HGB 13.1 14.6 11.5*  HCT 40.2 42.4 32.4*  MCV 116.2* 101.7* 99.7  PLT 102* 57* 50*     Basic Metabolic Panel: Recent Labs  Lab 05/11/2022 2232 05/09/2022 0614 05/14/2022 1343 04/29/2022 0435 05/15/2022 1309 05/14/2022 1828  NA 128* 134*  --  131* 131* 134*  K 3.1* 4.1  --  2.9* 3.2* 3.8  CL 92* 105  --  90* 86* 87*  CO2 13* <7*  --  16* 19* 21*  GLUCOSE 85 63*  --  406* 302* 153*  BUN 32* 23*  --  24* 24* 24*  CREATININE 1.85* 1.13*  --  2.01* 2.29* 2.40*  CALCIUM 8.2* 6.6*  --  6.1* 6.1* 5.8*  MG 1.5*  --   --  1.9  --  1.6*  PHOS  --   --  4.2 1.3*  --  1.6*    GFR: Estimated Creatinine Clearance: 21.5 mL/min (A) (by C-G formula based on SCr of 2.4 mg/dL (H)). Recent Labs  Lab 04/29/2022 2232 2022/05/13 0005 05-13-22 0611 2022-05-13 1121 05/16/2022 0435 05/05/2022 1105 05/15/2022 0538  PROCALCITON  --   --   --  28.40 70.83  --   --   WBC 11.7*  --   --   --  9.7  --  5.8  LATICACIDVEN >9.0*   < > >9.0*  --  >9.0* >9.0* >9.0*   < > = values in this interval not displayed.     Liver Function Tests: Recent Labs  Lab 04/26/2022 2232 May 13, 2022 0614 05/22/2022 0106  05/16/2022 0435 04/28/2022 1309  AST 1,205* 1,616* 7,017* 9,162* >10,000*  ALT 252* 315* 1,351* 1,708* 2,237*  ALKPHOS 114 98 124 122 128*  BILITOT 1.6* 1.5* 3.3* 3.5* 3.5*  PROT 6.4* 4.7* 4.1* 4.1* 4.1*  ALBUMIN 3.2* 2.2* 1.9* 1.8* 1.8*    Recent Labs  Lab 05/13/2022 0005 May 13, 2022 0714  LIPASE 25  --   AMYLASE  --  29    Recent Labs  Lab May 13, 2022 1558  AMMONIA 34     ABG    Component Value Date/Time   PHART 7.35 05/02/2022 0307   PCO2ART 22 (L) 05/09/2022 0307   PO2ART 94 05/25/2022 0307   HCO3 12.1 (L) 05/24/2022 0307   ACIDBASEDEF 11.4 (H) 05/05/2022 0307   O2SAT 98.9 05/11/2022 0307     Coagulation Profile: Recent Labs  Lab 2022/05/13 0005 05/12/2022 0348 05/07/2022 0435 04/28/2022 1309 05/11/2022 0538  INR 1.9* >10.0* >10.0* 7.8* >10.0*     Cardiac Enzymes: Recent Labs  Lab May 13, 2022 0714  CKTOTAL 45     HbA1C: No results found for: "HGBA1C"  CBG: Recent Labs  Lab 04/26/2022 2149 05/14/2022 2251  2336 05/13/2022 0037 05/09/2022 0400  GLUCAP 92 71 79 103* 97       DVT/GI PRX  assessed I Assessed the need for Labs I Assessed the need for Foley I Assessed the need for Central Venous Line Family Discussion when available I Assessed the need for Mobilization I made an Assessment of medications to be adjusted accordingly Safety Risk assessment completed  CASE DISCUSSED IN MULTIDISCIPLINARY ROUNDS WITH ICU TEAM     Critical Care Time devoted to patient care services described in this note is 55 minutes.  Critical care was necessary to treat /prevent imminent and life-threatening deterioration. Overall, patient is critically ill, prognosis is guarded.  Patient with Multiorgan failure and at high risk for cardiac arrest and death.    Lucie Leather, M.D.  [facility]  Medical Director [facility] Medical Director [facility] Cardio-Pulmonary Department

## 2022-05-27 NOTE — Inpatient Diabetes Management (Signed)
Inpatient Diabetes Program Recommendations  AACE/ADA: New Consensus Statement on Inpatient Glycemic Control (2015)  Target Ranges:  Prepandial:   less than 140 mg/dL      Peak postprandial:   less than 180 mg/dL (1-2 hours)      Critically ill patients:  140 - 180 mg/dL   Lab Results  Component Value Date   GLUCAP 145 (H)     Review of Glycemic Control  Latest Reference Range & Units 05/18/2022 04:00 05/17/2022 07:31 05/06/2022 07:34 05/24/2022 08:00  Glucose-Capillary 70 - 99 mg/dL 97 30 (LL) 32 (LL) 145 (H)  (LL): Data is critically low  Current orders for Inpatient glycemic control: Levemir 5 units BID, Novolog 2-6 units Q4H  Inpatient Diabetes Program Recommendations:    Consider discontinuing Levemir due to hypoglycemia.   Thanks, Bronson Curb, MSN, RNC-OB Diabetes Coordinator [phone] (8a-5p)

## 2022-05-27 NOTE — Death Summary Note (Signed)
DEATH SUMMARY   Patient Details  Name: Ariel White MRN: [id number] DOB: 07/05/1960  Admission/Discharge Information   Admit Date:  05/15/2022  Date of Death:  05/13/2022   Time of Death:  1414  Length of Stay: 2  Referring Physician: Carren Rangarter, Danielle, PA-C   Reason(s) for Hospitalization  Septic shock, infected kidney stone, liver failure, ETOH abuse   Diagnoses  Preliminary cause of death: Septic shock, infected kidney stone, liver failure, ETOH abuse Secondary Diagnoses (including complications and co-morbidities):  Principal Problem:   Acute GI bleeding Active Problems:   Septic shock (HCC)   E coli bacteremia   AKI (acute kidney injury) (HCC)   Acute liver failure without hepatic coma   Brief Hospital Course (including significant findings, care, treatment, and services provided and events leading to death)     Pertinent Labs and Studies  Signific History of Present Illness:  This is a 62 yo female who [facility] ER on 01/7 via EMS with c/o shortness of breath, abdominal pain, generalized weakness, and dark tarry stools onset 3 days prior to presentation.  She denies taking blood thinners, however she does endorse taking frequent doses of tylenol. Pts sister reported the pt has also had alcohol use as well.  The pt reported to EMS she had not eaten in several days.  En route to the ER pt became hypotensive bp 55/36/heart rate 114/RA 98%, code sepsis activated.     ED Course  Upon arrival to the ER pt remained profoundly hypotensive, hypothermic, and tachycardiac.  Pt placed on 6L O2 via nasal canula.  She received 2L of NS and 2 units of emergent pRBC's.  She also received zofran/protonix/metronidazole/vancomycin. Significant labs were Na+ 128/K+ 3.1/chloride 92/CO2 13/BUN 32/creatinine 1.85/anion gap 23/magnesium 1.5/AST 1,205/ALT 252/lactic acid >9.0/wbc 11.7/platelets 102/tylenol level 93/salicylate <7.0. Due to concern of unintentional tylenol overdose poison  control contacted and recommended acetylcysteine load followed by continuous gtt and 820 mg iv antizol x1 dose.  CT Abd/Pelvis concerning for mild left hydronephrosis/perinephric stranding probable obstructing 4 mm calculus within the proximal left ureter/mild hepatic steatosis/gallbladder wall thickening and soft tissue infiltration within the porta hepatis.  Urology consulted by ER provider and at that time held off on stent placement with recommendation to stabilize the pt with plans for stent placement in the am.  However, pt later decompensated and stent placement put on hold.  Due to concern of possible choledocholithiasis MRCP and Abdominal US ordered.  Results were negative for choledocholithiasis (see detailed report below).  General Surgery consulted with recommendations to treat tylenol overdose with low suspicion for cholecystitis.  While in the ER pt became hypotensive requiring levophed gtt.  PCCM team contacted for ICU admission.      CXR: No active disease.  CT Head/Cervical Spine:  No acute intracranial abnormalities. Chronic atrophy and small vessel ischemic changes. Postoperative changes in the skull base. Alignment of the cervical spine is unchanged since prior study consistent with degenerative change. No acute displaced fractures identified.   CT Elbow Right: Acute supracondylar fracture involving the distal humerus is identified. The fracture fragments are in near anatomic alignment. Remote distal diaphyseal fracture of the distal humerus.   CT Abd/Pelvis:  Mild left hydronephrosis and perinephric stranding probable obstructing 4 mm calculus within the proximal left ureter. Mild surrounding inflammatory stranding suggests periureteric stranding related to urothelial inflammation. Correlation with urinalysis and urine culture may be helpful. Superimposed mild bilateral nonobstructing nephrolithiasis. Mild hepatic steatosis. Gallbladder wall thickening and soft tissue infiltration  within the porta hepatis. This may reflect changes of a inflammatory process involving the biliary tree, head of the pancreas, or second portion of the duodenum. Correlation with liver enzymes and serum amylase and lipase levels may be helpful for further evaluation. Aortic atherosclerosis. (ICD10-I70.0).   MRCP Abd:  Marked edema and wall thickening of the gallbladder wall with a nondistended lumen. No evidence for gallstones an no discrete enhancing mass lesion is discernible within the gallbladder although the mucosa is diffusely hyperenhancing after contrast administration. Gallbladder wall thickening/edema is considered nonspecific in this study given no discernible gallstones and can be seen in the setting of infection/inflammation, volume overload, liver failure, and heart failure. No substantial intrahepatic or extrahepatic biliary duct dilatation. No evidence of choledocholithiasis. Pancreas appears to have some edema in the parenchyma with associated edema in the retroperitoneal tissues. Imaging of the pancreas is nondiagnostic on pre and postcontrast T1 imaging due to motion artifact. Fairly extensive edema in the gallbladder fossa, hepatoduodenal ligament, retroperitoneal tissues. Jejunal small bowel loops in the upper abdomen appear edematous. Small volume free fluid around the liver and spleen with retroperitoneal edema/fluid noted bilaterally. Hepatomegaly with fatty deposition. Small bilateral effusions.   Pertinent  Medical History  Anxiety  Arthritis  Bipolar 1 Disorder  Chronic Pain  Depression  Eczema  Fibromyalgia  GERD  Migraines  Hiatal Hernia  Neuromuscular Disorder  Seizures    Significant Hospital Events: Including procedures, antibiotic start and stop dates in addition to other pertinent events   01/7: Pt admitted to ICU with anion gap metabolic acidosis, acute respiratory failure, and septic shock along with 4 mm obstructing ureteral calculus; liver failure secondary  to unintentional tylenol overdose along with alcohol use and possible acute cholecystitis requiring mechanical intubation and vasopressor support BLOOD CX + E coli/ENTEROBACTER SPECIES 01/7: Attempted to transfer pt to [facility], however [facility] declined transfer multiorgan failure 1/8 multiorgan failure May 22, 2022 multiorgan failure patient is dying    62 yo white female with severe Acute respiratory failure in the setting of anion gap metabolic acidosis due to septic shock with e coli bacteremia due to infected kidney stone complicated by acute and severe metabolic encephalopathy with liver and renal failure in setting of ETOH abuse     GOALS OF CARE DISCUSSION   The Clinical status was relayed to family in detail-Sister Downs at Bedside Son does NOT want to be part of any decision making   Updated and notified of patients medical condition- Patient remains unresponsive and will not open eyes to command.   Patient is having a weak cough and struggling to remove secretions.   Patient with increased WOB and using accessory muscles to breathe Explained to family course of therapy and the modalities    Patient with Progressive multiorgan failure with a very high probablity of a very minimal chance of meaningful recovery despite all aggressive and optimal medical therapy.  PATIENT REMAINS DNR/DNI   Family understands the situation.   They have consented and agreed to DNR/DNI and would like to proceed with Comfort care measures.   Family are satisfied with Plan of action and management. All questions answered   Patient expired 1414        DG OR UROLOGY CYSTO IMAGE ([facility] ONLY)  Result Date: 05/24/2022 There is no interpretation for this exam.  This order is for images obtained during a surgical procedure.  Please See "Surgeries" Tab for more information regarding the procedure.   DG Abd 1 View  Result Date:  05/15/22 CLINICAL DATA:  Confirm OG tube EXAM: ABDOMEN - 1 VIEW  COMPARISON:  None Available. FINDINGS: The distal tip of the OG tube is in the distal body of the stomach. IMPRESSION: The distal tip of the OG tube is in the distal body of the stomach. Electronically Signed   By: Gerome Sam III M.D.   On: 2022-05-15 12:35   DG Chest Port 1 View  Result Date: 15-May-2022 CLINICAL DATA:  Endotracheal and OG tube placement. EXAM: PORTABLE CHEST 1 VIEW COMPARISON:  Earlier same day FINDINGS: 0847 hours. Endotracheal tube tip is 4.9 cm above the base of the carina. The NG tube passes into the stomach although the distal tip position is not included on the film. Right IJ central line tip overlies the distal SVC level. The lungs are clear without focal pneumonia, edema, pneumothorax or pleural effusion. The cardiopericardial silhouette is within normal limits for size. Status post right shoulder replacement. Telemetry leads overlie the chest. IMPRESSION: 1. Support apparatus as described. 2. No acute cardiopulmonary findings. Electronically Signed   By: Kennith Center M.D.   On: May 15, 2022 09:16   DG Chest 1 View  Result Date: 05-15-2022 CLINICAL DATA:  Encounter for central line EXAM: CHEST  1 VIEW COMPARISON:  Yesterday FINDINGS: Right IJ line with tip at the upper cavoatrial junction. Interface from artifact over the right apex with no pleural line, no suspected pneumothorax. Normal heart size and mediastinal contours. Symmetric aeration. Right shoulder replacement. IMPRESSION: New central line without complicating features. Electronically Signed   By: Tiburcio Pea M.D.   On: 05-15-22 08:01   MR ABDOMEN MRCP W WO CONTAST  Result Date: 05/15/22 CLINICAL DATA:  Gallbladder wall thickening on prior imaging. EXAM: MRI ABDOMEN WITHOUT AND WITH CONTRAST (INCLUDING MRCP) TECHNIQUE: Multiplanar multisequence MR imaging of the abdomen was performed both before and after the administration of intravenous contrast. Heavily T2-weighted images of the biliary and pancreatic ducts  were obtained, and three-dimensional MRCP images were rendered by post processing. CONTRAST:  88mL GADAVIST GADOBUTROL 1 MMOL/ML IV SOLN COMPARISON:  CT and ultrasound exams from earlier the same day FINDINGS: Lower chest: Small bilateral effusions. Hepatobiliary: Liver measures 17.6 cm craniocaudal length, enlarged. Diffuse loss of signal intensity in the liver parenchyma on out of phase T1 imaging is compatible with fatty deposition. Imaging of the liver is markedly motion degraded and smaller subtle lesions could easily be obscured by the artifact. Within this limitation no gross hepatic mass lesion is discernible. No substantial intrahepatic biliary duct dilatation. As noted on the prior studies, there is marked edema and wall thickening of the gallbladder wall with a nondistended lumen. As noted, postcontrast imaging is markedly degraded, but no discrete enhancing mass lesion is discernible within the gallbladder although the mucosa is diffusely hyperenhancing. No gallstones are identified. Common bile duct is nondilated at 3 mm diameter. No evidence of choledocholithiasis. Pancreas: Pancreas appears to have some edema in the parenchyma with associated edema in the retroperitoneal tissues. Imaging of the pancreas is nondiagnostic on pre and postcontrast T1 imaging due to motion artifact Spleen:  No splenomegaly. No focal mass lesion. Adrenals/Urinary Tract: No adrenal nodule or mass. Kidneys unremarkable. Stomach/Bowel: Stomach is moderately distended with gas and fluid. Duodenal wall appears thick with edema. Small bowel loops in the visualized upper abdomen appear edematous. Vascular/Lymphatic: No abdominal aortic aneurysm. Portal vein and superior mesenteric vein show normal flow signal characteristics compatible with patency. Splenic vein shows diffuse enhancement after contrast administration suggesting patency. Other:  There is extensive edema around the gallbladder, in the hepatoduodenal ligament and, and  in the retroperitoneal space. Small volume free fluid is seen around the liver and spleen with retroperitoneal edema/fluid noted bilaterally. Musculoskeletal: No focal suspicious marrow enhancement within the visualized bony anatomy. IMPRESSION: 1. Marked edema and wall thickening of the gallbladder wall with a nondistended lumen. No evidence for gallstones an no discrete enhancing mass lesion is discernible within the gallbladder although the mucosa is diffusely hyperenhancing after contrast administration. Gallbladder wall thickening/edema is considered nonspecific in this study given no discernible gallstones and can be seen in the setting of infection/inflammation, volume overload, liver failure, and heart failure. 2. No substantial intrahepatic or extrahepatic biliary duct dilatation. No evidence of choledocholithiasis. 3. Pancreas appears to have some edema in the parenchyma with associated edema in the retroperitoneal tissues. Imaging of the pancreas is nondiagnostic on pre and postcontrast T1 imaging due to motion artifact. 4. Fairly extensive edema in the gallbladder fossa, hepatoduodenal ligament, retroperitoneal tissues. Jejunal small bowel loops in the upper abdomen appear edematous. 5. Small volume free fluid around the liver and spleen with retroperitoneal edema/fluid noted bilaterally. 6. Hepatomegaly with fatty deposition. 7. Small bilateral effusions. Electronically Signed   By: Kennith Center M.D.   On: 05/25/2022 05:46   MR 3D Recon At Scanner  Result Date: 05/14/2022 CLINICAL DATA:  Gallbladder wall thickening on prior imaging. EXAM: MRI ABDOMEN WITHOUT AND WITH CONTRAST (INCLUDING MRCP) TECHNIQUE: Multiplanar multisequence MR imaging of the abdomen was performed both before and after the administration of intravenous contrast. Heavily T2-weighted images of the biliary and pancreatic ducts were obtained, and three-dimensional MRCP images were rendered by post processing. CONTRAST:  15mL GADAVIST  GADOBUTROL 1 MMOL/ML IV SOLN COMPARISON:  CT and ultrasound exams from earlier the same day FINDINGS: Lower chest: Small bilateral effusions. Hepatobiliary: Liver measures 17.6 cm craniocaudal length, enlarged. Diffuse loss of signal intensity in the liver parenchyma on out of phase T1 imaging is compatible with fatty deposition. Imaging of the liver is markedly motion degraded and smaller subtle lesions could easily be obscured by the artifact. Within this limitation no gross hepatic mass lesion is discernible. No substantial intrahepatic biliary duct dilatation. As noted on the prior studies, there is marked edema and wall thickening of the gallbladder wall with a nondistended lumen. As noted, postcontrast imaging is markedly degraded, but no discrete enhancing mass lesion is discernible within the gallbladder although the mucosa is diffusely hyperenhancing. No gallstones are identified. Common bile duct is nondilated at 3 mm diameter. No evidence of choledocholithiasis. Pancreas: Pancreas appears to have some edema in the parenchyma with associated edema in the retroperitoneal tissues. Imaging of the pancreas is nondiagnostic on pre and postcontrast T1 imaging due to motion artifact Spleen:  No splenomegaly. No focal mass lesion. Adrenals/Urinary Tract: No adrenal nodule or mass. Kidneys unremarkable. Stomach/Bowel: Stomach is moderately distended with gas and fluid. Duodenal wall appears thick with edema. Small bowel loops in the visualized upper abdomen appear edematous. Vascular/Lymphatic: No abdominal aortic aneurysm. Portal vein and superior mesenteric vein show normal flow signal characteristics compatible with patency. Splenic vein shows diffuse enhancement after contrast administration suggesting patency. Other: There is extensive edema around the gallbladder, in the hepatoduodenal ligament and, and in the retroperitoneal space. Small volume free fluid is seen around the liver and spleen with  retroperitoneal edema/fluid noted bilaterally. Musculoskeletal: No focal suspicious marrow enhancement within the visualized bony anatomy. IMPRESSION: 1. Marked edema and wall thickening of the gallbladder wall  with a nondistended lumen. No evidence for gallstones an no discrete enhancing mass lesion is discernible within the gallbladder although the mucosa is diffusely hyperenhancing after contrast administration. Gallbladder wall thickening/edema is considered nonspecific in this study given no discernible gallstones and can be seen in the setting of infection/inflammation, volume overload, liver failure, and heart failure. 2. No substantial intrahepatic or extrahepatic biliary duct dilatation. No evidence of choledocholithiasis. 3. Pancreas appears to have some edema in the parenchyma with associated edema in the retroperitoneal tissues. Imaging of the pancreas is nondiagnostic on pre and postcontrast T1 imaging due to motion artifact. 4. Fairly extensive edema in the gallbladder fossa, hepatoduodenal ligament, retroperitoneal tissues. Jejunal small bowel loops in the upper abdomen appear edematous. 5. Small volume free fluid around the liver and spleen with retroperitoneal edema/fluid noted bilaterally. 6. Hepatomegaly with fatty deposition. 7. Small bilateral effusions. Electronically Signed   By: Kennith Center M.D.   On: 05/02/2022 05:46   US ABDOMEN LIMITED RUQ (LIVER/GB)  Result Date: 05/21/2022 CLINICAL DATA:  Right upper quadrant abdominal pain EXAM: ULTRASOUND ABDOMEN LIMITED RIGHT UPPER QUADRANT COMPARISON:  None Available. FINDINGS: Gallbladder: There is marked thickening of the gallbladder wall which measures up to 12 mm in thickness. The gallbladder, however, is largely decompressed. No pericholecystic fluid identified. No intraluminal stones or sludge is seen. The sonographic Eulah Pont sign is reportedly POSITIVE. Common bile duct: Diameter: 4 mm in proximal diameter Liver: Normal hepatic parenchymal  echogenicity and echotexture. No focal intrahepatic masses are seen and there is no intrahepatic biliary ductal dilation. Portal vein is patent on color Doppler imaging with normal direction of blood flow towards the liver. Other: None. IMPRESSION: 1. Marked thickening of the gallbladder wall, nonspecific. This can be seen in the setting of hepatic or biliary inflammation, volume overload/anasarca, or cardiogenic failure. Correlation with liver enzymes may be helpful aeration. Electronically Signed   By: Helyn Numbers M.D.   On: 04/28/2022 02:39   CT ABDOMEN PELVIS WO CONTRAST  Result Date: 05/22/2022 CLINICAL DATA:  Abdominal pain, acute, nonlocalized EXAM: CT ABDOMEN AND PELVIS WITHOUT CONTRAST TECHNIQUE: Multidetector CT imaging of the abdomen and pelvis was performed following the standard protocol without IV contrast. RADIATION DOSE REDUCTION: This exam was performed according to the departmental dose-optimization program which includes automated exposure control, adjustment of the mA and/or kV according to patient size and/or use of iterative reconstruction technique. COMPARISON:  01/14/2019 FINDINGS: Lower chest: No acute abnormality. Hepatobiliary: At least mild hepatic steatosis. No focal intrahepatic mass identified on this noncontrast examination. Focal fatty hepatic infiltration adjacent the falciform ligament. The gallbladder wall is thickened and there is infiltration within the porta hepatis which may reflect changes of a inflammatory process involving the biliary tree, head of the pancreas, or second portion of the duodenum. No loculated perihepatic fluid collections are identified. No definite intra or extrahepatic biliary ductal dilation. The gallbladder is not distended. Pancreas: As noted above, mild peripancreatic inflammatory changes are identified surrounding the head of the pancreas centered within the pancreatico duodenal groove and extending into the porta hepatis. The body and tail the  pancreas are unremarkable. The pancreatic duct is not dilated. No peripancreatic loculated fluid collections are identified. No parenchymal calcifications are seen. Spleen: Unremarkable Adrenals/Urinary Tract: The adrenal glands are unremarkable. The kidneys are normal in size and position. Mild left hydronephrosis and perinephric stranding has developed with a 4 mm calculus seen anterior to the left psoas, possibly within the proximal left ureter. Mild surrounding inflammatory stranding suggests periureteric  stranding related to urothelial inflammation. No hydronephrosis on the right. There are bilateral nonobstructing renal calculi identified measuring up to 5 mm on the right and 2 mm on the left. No additional ureteral calculi. The bladder is unremarkable. Stomach/Bowel: Stomach is within normal limits. Appendix absent. No evidence of bowel wall thickening, distention, or inflammatory changes. Vascular/Lymphatic: Aortic atherosclerosis. No enlarged abdominal or pelvic lymph nodes. Reproductive: Uterus and bilateral adnexa are unremarkable. Other: No abdominal wall hernia. Musculoskeletal: Osseous structures are age-appropriate. No acute bone abnormality. No lytic or blastic bone lesion. Multiple healed right rib fractures again noted. IMPRESSION: 1. Mild left hydronephrosis and perinephric stranding probable obstructing 4 mm calculus within the proximal left ureter. Mild surrounding inflammatory stranding suggests periureteric stranding related to urothelial inflammation. Correlation with urinalysis and urine culture may be helpful. Superimposed mild bilateral nonobstructing nephrolithiasis. 2. Mild hepatic steatosis. 3. Gallbladder wall thickening and soft tissue infiltration within the porta hepatis. This may reflect changes of a inflammatory process involving the biliary tree, head of the pancreas, or second portion of the duodenum. Correlation with liver enzymes and serum amylase and lipase levels may be  helpful for further evaluation. 4. Aortic atherosclerosis. Aortic Atherosclerosis (ICD10-I70.0). Electronically Signed   By: Helyn NumbersAshesh  Parikh M.D.   On: 05/14/2022 00:52   DG Chest Port 1 View  Result Date: 05/22/2022 CLINICAL DATA:  Questionable sepsis EXAM: PORTABLE CHEST 1 VIEW COMPARISON:  Chest x-ray 10/29/2020 FINDINGS: The heart size and mediastinal contours are within normal limits. Both lungs are clear. Right shoulder arthroplasty again noted. No acute fractures. IMPRESSION: No active disease. Electronically Signed   By: Darliss CheneyAmy  Guttmann M.D.   On: 05/07/2022 22:52    Microbiology Recent Results (from the past 240 hour(s))  Resp panel by RT-PCR (RSV, Flu A&B, Covid) Anterior Nasal Swab     Status: None   Collection Time: 04/26/2022 10:27 PM   Specimen: Anterior Nasal Swab  Result Value Ref Range Status   SARS Coronavirus 2 by RT PCR NEGATIVE NEGATIVE Final    Comment: (NOTE) SARS-CoV-2 target nucleic acids are NOT DETECTED.  The SARS-CoV-2 RNA is generally detectable in upper respiratory specimens during the acute phase of infection. The lowest concentration of SARS-CoV-2 viral copies this assay can detect is 138 copies/mL. A negative result does not preclude SARS-Cov-2 infection and should not be used as the sole basis for treatment or other patient management decisions. A negative result may occur with  improper specimen collection/handling, submission of specimen other than nasopharyngeal swab, presence of viral mutation(s) within the areas targeted by this assay, and inadequate number of viral copies(<138 copies/mL). A negative result must be combined with clinical observations, patient history, and epidemiological information. The expected result is Negative.  Fact Sheet for Patients:  BloggerCourse.comhttps://www.fda.gov/media/152166/download  Fact Sheet for Healthcare Providers:  SeriousBroker.ithttps://www.fda.gov/media/152162/download  This test is no t yet approved or cleared by the [place] FDA  and  has been authorized for detection and/or diagnosis of SARS-CoV-2 by FDA under an Emergency Use Authorization (EUA). This EUA will remain  in effect (meaning this test can be used) for the duration of the COVID-19 declaration under Section 564(b)(1) of the Act, 21 U.S.C.section 360bbb-3(b)(1), unless the authorization is terminated  or revoked sooner.       Influenza A by PCR NEGATIVE NEGATIVE Final   Influenza B by PCR NEGATIVE NEGATIVE Final    Comment: (NOTE) The Xpert Xpress SARS-CoV-2/FLU/RSV plus assay is intended as an aid in the diagnosis of influenza from Nasopharyngeal swab  specimens and should not be used as a sole basis for treatment. Nasal washings and aspirates are unacceptable for Xpert Xpress SARS-CoV-2/FLU/RSV testing.  Fact Sheet for Patients: BloggerCourse.com  Fact Sheet for Healthcare Providers: SeriousBroker.it  This test is not yet approved or cleared by the [place] FDA and has been authorized for detection and/or diagnosis of SARS-CoV-2 by FDA under an Emergency Use Authorization (EUA). This EUA will remain in effect (meaning this test can be used) for the duration of the COVID-19 declaration under Section 564(b)(1) of the Act, 21 U.S.C. section 360bbb-3(b)(1), unless the authorization is terminated or revoked.     Resp Syncytial Virus by PCR NEGATIVE NEGATIVE Final    Comment: (NOTE) Fact Sheet for Patients: BloggerCourse.com  Fact Sheet for Healthcare Providers: SeriousBroker.it  This test is not yet approved or cleared by the [place] FDA and has been authorized for detection and/or diagnosis of SARS-CoV-2 by FDA under an Emergency Use Authorization (EUA). This EUA will remain in effect (meaning this test can be used) for the duration of the COVID-19 declaration under Section 564(b)(1) of the Act, 21 U.S.C. section 360bbb-3(b)(1),  unless the authorization is terminated or revoked.  Performed at [facility], [address], [place], [place] [address]   Blood Culture (routine x 2)     Status: Abnormal   Collection Time: 05/10/2022 10:27 PM   Specimen: BLOOD  Result Value Ref Range Status   Specimen Description   Final    BLOOD BLOOD LEFT FOREARM Performed at [facility], [address] [place], [place] [address]    Special Requests   Final    BOTTLES DRAWN AEROBIC AND ANAEROBIC Blood Culture results may not be optimal due to an inadequate volume of blood received in culture bottles Performed at [facility], [address], [place], [place] [address]    Culture  Setup Time   Final    GRAM NEGATIVE RODS IN BOTH AEROBIC AND ANAEROBIC BOTTLES CRITICAL VALUE NOTED.  VALUE IS CONSISTENT WITH PREVIOUSLY REPORTED AND CALLED VALUE. Performed at [facility], [address] [place], [place] [address]    Culture (A)  Final    ESCHERICHIA COLI SUSCEPTIBILITIES PERFORMED ON PREVIOUS CULTURE WITHIN THE LAST 5 DAYS. Performed at [facility], [address], [place], [place] [address]    Report Status 05/22/2022 FINAL  Final  Blood Culture (routine x 2)     Status: Abnormal   Collection Time: 05/08/2022 10:27 PM   Specimen: BLOOD  Result Value Ref Range Status   Specimen Description   Final    BLOOD BLOOD RIGHT FOREARM Performed at [facility], [address] [place], [place] [address]    Special Requests   Final    BOTTLES DRAWN AEROBIC AND ANAEROBIC Blood Culture adequate volume Performed at [facility], [address] [place], [place] [address]    Culture  Setup Time   Final    ANAEROBIC BOTTLE ONLY IN BOTH AEROBIC AND ANAEROBIC BOTTLES CRITICAL RESULT CALLED TO, READ BACK BY AND VERIFIED WITH: DVAN MITCHELL @1220  05/10/2022 MJU    Culture ESCHERICHIA COLI (A)  Final   Report Status 04/30/2022 FINAL  Final   Organism ID, Bacteria ESCHERICHIA  COLI  Final      Susceptibility   Escherichia coli - MIC*    AMPICILLIN <=2 SENSITIVE Sensitive     CEFAZOLIN <=4 SENSITIVE Sensitive     CEFEPIME <=0.12 SENSITIVE Sensitive     CEFTAZIDIME <=1  SENSITIVE Sensitive     CEFTRIAXONE <=0.25 SENSITIVE Sensitive     CIPROFLOXACIN <=0.25 SENSITIVE Sensitive     GENTAMICIN <=1 SENSITIVE Sensitive     IMIPENEM <=0.25 SENSITIVE Sensitive     TRIMETH/SULFA <=20 SENSITIVE Sensitive     AMPICILLIN/SULBACTAM <=2 SENSITIVE Sensitive     PIP/TAZO <=4 SENSITIVE Sensitive     * ESCHERICHIA COLI  Blood Culture ID Panel (Reflexed)     Status: Abnormal   Collection Time: 05/21/2022 10:27 PM  Result Value Ref Range Status   Enterococcus faecalis NOT DETECTED NOT DETECTED Final   Enterococcus Faecium NOT DETECTED NOT DETECTED Final   Listeria monocytogenes NOT DETECTED NOT DETECTED Final   Staphylococcus species NOT DETECTED NOT DETECTED Final   Staphylococcus aureus (BCID) NOT DETECTED NOT DETECTED Final   Staphylococcus epidermidis NOT DETECTED NOT DETECTED Final   Staphylococcus lugdunensis NOT DETECTED NOT DETECTED Final   Streptococcus species NOT DETECTED NOT DETECTED Final   Streptococcus agalactiae NOT DETECTED NOT DETECTED Final   Streptococcus pneumoniae NOT DETECTED NOT DETECTED Final   Streptococcus pyogenes NOT DETECTED NOT DETECTED Final   A.calcoaceticus-baumannii NOT DETECTED NOT DETECTED Final   Bacteroides fragilis NOT DETECTED NOT DETECTED Final   Enterobacterales DETECTED (A) NOT DETECTED Final    Comment: Enterobacterales represent a large order of gram negative bacteria, not a single organism. CRITICAL RESULT CALLED TO, READ BACK BY AND VERIFIED WITH: DAVAN MITCHELL @1220  05/18/2022 MJU    Enterobacter cloacae complex NOT DETECTED NOT DETECTED Final   Escherichia coli DETECTED (A) NOT DETECTED Final    Comment: CRITICAL RESULT CALLED TO, READ BACK BY AND VERIFIED WITH: DVAN MITCHELL @1220  05/12/2022 MJU    Klebsiella aerogenes NOT  DETECTED NOT DETECTED Final   Klebsiella oxytoca NOT DETECTED NOT DETECTED Final   Klebsiella pneumoniae NOT DETECTED NOT DETECTED Final   Proteus species NOT DETECTED NOT DETECTED Final   Salmonella species NOT DETECTED NOT DETECTED Final   Serratia marcescens NOT DETECTED NOT DETECTED Final   Haemophilus influenzae NOT DETECTED NOT DETECTED Final   Neisseria meningitidis NOT DETECTED NOT DETECTED Final   Pseudomonas aeruginosa NOT DETECTED NOT DETECTED Final   Stenotrophomonas maltophilia NOT DETECTED NOT DETECTED Final   Candida albicans NOT DETECTED NOT DETECTED Final   Candida auris NOT DETECTED NOT DETECTED Final   Candida glabrata NOT DETECTED NOT DETECTED Final   Candida krusei NOT DETECTED NOT DETECTED Final   Candida parapsilosis NOT DETECTED NOT DETECTED Final   Candida tropicalis NOT DETECTED NOT DETECTED Final   Cryptococcus neoformans/gattii NOT DETECTED NOT DETECTED Final   CTX-M ESBL NOT DETECTED NOT DETECTED Final   Carbapenem resistance IMP NOT DETECTED NOT DETECTED Final   Carbapenem resistance KPC NOT DETECTED NOT DETECTED Final   Carbapenem resistance NDM NOT DETECTED NOT DETECTED Final   Carbapenem resist OXA 48 LIKE NOT DETECTED NOT DETECTED Final   Carbapenem resistance VIM NOT DETECTED NOT DETECTED Final    Comment: Performed at [facility], [address] [place], [address] [place]  Urine Culture     Status: Abnormal (Preliminary result)   Collection Time: 05/23/2022 10:30 PM   Specimen: In/Out Cath Urine  Result Value Ref Range Status   Specimen Description   Final    IN/OUT CATH URINE Performed at [facility], [address], [place], [address] [place]    Special Requests   Final    NONE Performed at [facility], [address] [place], [address] [place]  Culture (A)  Final    1,000 COLONIES/mL ESCHERICHIA COLI 400 COLONIES/mL ENTEROCOCCUS FAECALIS SUSCEPTIBILITIES TO FOLLOW Performed at [facility],  [place] [address] [place], [place] [address]    Report Status PENDING  Incomplete  MRSA Next Gen by PCR, Nasal     Status: None   Collection Time: 04/26/2022  8:40 AM   Specimen: Nasal Mucosa; Nasal Swab  Result Value Ref Range Status   MRSA by PCR Next Gen NOT DETECTED NOT DETECTED Final    Comment: (NOTE) The GeneXpert MRSA Assay (FDA approved for NASAL specimens only), is one component of a comprehensive MRSA colonization surveillance program. It is not intended to diagnose MRSA infection nor to guide or monitor treatment for MRSA infections. Test performance is not FDA approved in patients less than 20 years old. Performed at [facility], [address] [place], [place] [address]   Culture, Respiratory w Gram Stain     Status: None (Preliminary result)   Collection Time: 05/06/2022 11:58 PM   Specimen: Tracheal Aspirate; Respiratory  Result Value Ref Range Status   Specimen Description   Final    TRACHEAL ASPIRATE Performed at [facility], [address] [place], [place] [address]    Special Requests   Final    NONE Performed at [facility], [address] [place], [place] [address]    Gram Stain   Final    FEW WBC PRESENT, PREDOMINANTLY MONONUCLEAR NO ORGANISMS SEEN    Culture   Final    NO GROWTH 1 DAY Performed at [facility], [place] [address] [place], [place] [address]    Report Status PENDING  Incomplete    Lab Basic Metabolic Panel: Recent Labs  Lab 05/12/2022 2232 05/20/2022 0614 04/30/2022 1343 05-07-2022 0435 May 07, 2022 1309 2022/05/07 1828 05/18/2022 0538  NA 128* 134*  --  131* 131* 134* 134*  K 3.1* 4.1  --  2.9* 3.2* 3.8 5.0  CL 92* 105  --  90* 86* 87* 79*  CO2 13* <7*  --  16* 19* 21* 25  GLUCOSE 85 63*  --  406* 302* 153* 57*  BUN 32* 23*  --  24* 24* 24* 24*  CREATININE 1.85* 1.13*  --  2.01* 2.29* 2.40* 2.69*  CALCIUM 8.2* 6.6*  --  6.1* 6.1* 5.8* 5.7*  MG 1.5*  --   --  1.9  --  1.6* 1.9  PHOS  --   --  4.2 1.3*  --   1.6* 3.6   Liver Function Tests: Recent Labs  Lab 05/11/2022 0614 05/07/22 0106 05/07/2022 0435 May 07, 2022 1309 05/26/2022 0538  AST 1,616* 7,017* 9,162* >10,000* >10,000*  ALT 315* 1,351* 1,708* 2,237* 2,703*  ALKPHOS 98 124 122 128* 174*  BILITOT 1.5* 3.3* 3.5* 3.5* 4.0*  PROT 4.7* 4.1* 4.1* 4.1* 3.8*  ALBUMIN 2.2* 1.9* 1.8* 1.8* 1.6*   Recent Labs  Lab 05/20/2022 0005 05/18/2022 0714  LIPASE 25  --   AMYLASE  --  29   Recent Labs  Lab 05/15/2022 1558  AMMONIA 34   CBC: Recent Labs  Lab 05/12/2022 2232 07-May-2022 0435 05/10/2022 0538  WBC 11.7* 9.7 5.8  NEUTROABS 9.4*  --   --   HGB 13.1 14.6 11.5*  HCT 40.2 42.4 32.4*  MCV 116.2* 101.7* 99.7  PLT 102* 57* 50*   Cardiac Enzymes: Recent Labs  Lab 04/27/2022 0714  CKTOTAL 45   Sepsis Labs: Recent Labs  Lab 05/26/2022 2232 05/20/2022 0005 05/16/2022  [id number] 05/11/2022 1121 05/06/2022 0435 04/26/2022 1105 2022/07/08 0538  PROCALCITON  --   --   --  28.40 70.83  --  77.58  WBC 11.7*  --   --   --  9.7  --  5.8  LATICACIDVEN >9.0*   < > >9.0*  --  >9.0* >9.0* >9.0*   < > = values in this interval not displayed.     Erin FullingKurian Teresea Donley 05/20/2022, 2:19 PM

## 2022-05-27 NOTE — Progress Notes (Signed)
Pt. Extubated to room air. 

## 2022-05-27 NOTE — Consult Note (Signed)
PHARMACY CONSULT NOTE - ELECTROLYTES  Pharmacy Consult for Electrolyte Monitoring and Replacement   Recent Labs: Potassium (mmol/L)  Date Value  May 27, 2022 3.8  01/01/2014 3.6   Magnesium (mg/dL)  Date Value  05-27-2022 1.6 (L)   Calcium (mg/dL)  Date Value  May 27, 2022 5.8 (LL)   Calcium, Total (mg/dL)  Date Value  01/01/2014 8.3 (L)   Albumin (g/dL)  Date Value  05/27/2022 1.8 (L)  01/01/2014 3.4   Phosphorus (mg/dL)  Date Value  05/27/2022 1.6 (L)   Sodium (mmol/L)  Date Value  May 27, 2022 134 (L)  01/01/2014 138   Corrected Ca: 7.6 mg/dL  Assessment  Ariel White is a 62 y.o. female presenting with GIB, sepsis. PMH significant for anxiety, arthritis, bipolar 1 disorder, depression, fibromyalgia, GERD, migraines, seizures. Pharmacy has been consulted to monitor and replace electrolytes.  Diet: NPO MIVF: Bicarb in SWFI @ 200 mL/hr Pertinent medications: insulin infusion discontinued  Goal of Therapy: Electrolytes WNL  Plan:  Potassium: 3.8 >> 5.0, no replacement needed Magnesium: 1.6 >> 1.9, no replacement needed Phosphorus: 1.6 >> 3.6, no replacement needed Check BMP, Mg, Phos with AM labs  Thank you for allowing pharmacy to be a part of this patient's care.  Gretel Acre, PharmD PGY1 Pharmacy Resident 05/15/2022 7:15 AM

## 2022-05-27 DEATH — deceased
